# Patient Record
Sex: Female | Born: 1960 | Race: Black or African American | Hispanic: No | Marital: Single | Smoking: Former smoker
Health system: Southern US, Community
[De-identification: ages and names within clinical notes are randomized; demographics above are authoritative.]

## PROBLEM LIST (undated history)

## (undated) DIAGNOSIS — K219 Gastro-esophageal reflux disease without esophagitis: Secondary | ICD-10-CM

## (undated) DIAGNOSIS — C801 Malignant (primary) neoplasm, unspecified: Secondary | ICD-10-CM

## (undated) DIAGNOSIS — L732 Hidradenitis suppurativa: Secondary | ICD-10-CM

## (undated) DIAGNOSIS — E785 Hyperlipidemia, unspecified: Secondary | ICD-10-CM

## (undated) DIAGNOSIS — E119 Type 2 diabetes mellitus without complications: Secondary | ICD-10-CM

## (undated) DIAGNOSIS — M1990 Unspecified osteoarthritis, unspecified site: Secondary | ICD-10-CM

## (undated) DIAGNOSIS — S83249A Other tear of medial meniscus, current injury, unspecified knee, initial encounter: Secondary | ICD-10-CM

## (undated) HISTORY — DX: Type 2 diabetes mellitus without complications: E11.9

## (undated) HISTORY — PX: AXILLARY HIDRADENITIS EXCISION: SUR522

## (undated) HISTORY — PX: PERINEAL HIDRADENITIS EXCISION: SUR524

## (undated) HISTORY — DX: Hidradenitis suppurativa: L73.2

---
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
## 2000-02-16 ENCOUNTER — Other Ambulatory Visit: Admission: RE | Admit: 2000-02-16 | Discharge: 2000-02-16 | Payer: Self-pay | Admitting: Obstetrics and Gynecology

## 2000-08-02 ENCOUNTER — Encounter: Admission: RE | Admit: 2000-08-02 | Discharge: 2000-10-31 | Payer: Self-pay | Admitting: Internal Medicine

## 2001-05-13 ENCOUNTER — Other Ambulatory Visit: Admission: RE | Admit: 2001-05-13 | Discharge: 2001-05-13 | Payer: Self-pay | Admitting: Internal Medicine

## 2001-05-29 ENCOUNTER — Encounter: Admission: RE | Admit: 2001-05-29 | Discharge: 2001-08-27 | Payer: Self-pay | Admitting: Internal Medicine

## 2012-08-23 ENCOUNTER — Ambulatory Visit: Payer: Federal, State, Local not specified - PPO

## 2012-08-23 ENCOUNTER — Ambulatory Visit (INDEPENDENT_AMBULATORY_CARE_PROVIDER_SITE_OTHER): Payer: BC Managed Care – PPO | Admitting: Family Medicine

## 2012-08-23 VITALS — BP 130/81 | HR 88 | Temp 98.7°F | Resp 18 | Ht 71.5 in | Wt 247.0 lb

## 2012-08-23 DIAGNOSIS — M79609 Pain in unspecified limb: Secondary | ICD-10-CM

## 2012-08-23 DIAGNOSIS — M79669 Pain in unspecified lower leg: Secondary | ICD-10-CM

## 2012-08-23 NOTE — Progress Notes (Signed)
[facility] ([facility]) [address], [place] [address] [phone]  Date:  08/23/2012   Name:  Kathryn Webb   DOB:  February 06, 1961   MRN:  [id number]  PCP:  No primary provider on file.    Chief Complaint: Leg Pain   History of Present Illness:  Kathryn Webb is a 51 y.o. very pleasant female patient who presents with the following:  She was exercising yesterday- doing an aerobics class at her job- and had sudden onset of pain in left lower leg.  She felt a "pull."  She notes tenderness and some swelling in the posterior calf- she tried ice and tiger balm.  She is able to walk but it hurts. No pain when she is still/ off of the leg.    Otherwise she is generally healthy except she is overweight.  She is working on this by exercise so she is disappointed that she got hurt.      There is no problem list on file for this patient.   No past medical history on file.  No past surgical history on file.  History  Substance Use Topics  . Smoking status: Former Games developer  . Smokeless tobacco: Not on file  . Alcohol Use: Not on file    No family history on file.  No Known Allergies  Medication list has been reviewed and updated.  Current Outpatient Prescriptions on File Prior to Visit  Medication Sig Dispense Refill  . phentermine 37.5 MG capsule Take 37.5 mg by mouth every morning.        Review of Systems:  As per HPI- otherwise negative.   Physical Examination: Filed Vitals:   08/23/12 1050  BP: 130/81  Pulse: 88  Temp: 98.7 F (37.1 C)  Resp: 18   Filed Vitals:   08/23/12 1050  Height: 5' 11.5" (1.816 m)  Weight: 247 lb (112.038 kg)   Body mass index is 33.97 kg/(m^2). Ideal Body Weight: Weight in (lb) to have BMI = 25: 181.4   GEN: WDWN, NAD, Non-toxic, A & O x 3- tall build HEENT: Atraumatic, Normocephalic. Neck supple. No masses, No LAD. Ears and Nose: No external deformity. CV: RRR, No M/G/R. No JVD. No thrill. No extra heart sounds. PULM:  CTA B, no wheezes, crackles, rhonchi. No retractions. No resp. distress. No accessory muscle use. EXTR: No c/c/e NEURO Normal gait.  PSYCH: Normally interactive. Conversant. Not depressed or anxious appearing.  Calm demeanor.  Left calf: she has tenderness in the proximal and mid- calf muscle.  Minimal swelling,  No cords or redness.  The achilles is intact and non- tender.  She has good flexion and extension of her knee.  She is able to flex and extend the foot but it hurts.  She has normal perfusion, sensation and motion of her toes, normal DP pulse  [facility] reading (PRIMARY) by  Dr. Patsy Lager.  Left tib/ fib: negative    Assessment and Plan: 1. Calf tenderness  DG Tibia/Fibula Left   Calf muscle tear.  Conservative therapy.  She did not want crutches- tried them and was not comfortable.  See pt instructions for more.    Abbe Amsterdam, MD

## 2012-08-23 NOTE — Patient Instructions (Addendum)
Try ibuprofen up to 800 mg three times a day for the next several days.  You can also use ice, elevation, an ace bandage and rest.  Let pain be your guide as far as returning to your activities.  If you are not a good bit better by Monday please let me know.

## 2013-03-07 ENCOUNTER — Other Ambulatory Visit (INDEPENDENT_AMBULATORY_CARE_PROVIDER_SITE_OTHER): Payer: BC Managed Care – PPO

## 2013-03-07 ENCOUNTER — Encounter: Payer: Self-pay | Admitting: Internal Medicine

## 2013-03-07 ENCOUNTER — Ambulatory Visit (INDEPENDENT_AMBULATORY_CARE_PROVIDER_SITE_OTHER): Payer: BC Managed Care – PPO | Admitting: Internal Medicine

## 2013-03-07 VITALS — BP 138/78 | HR 61 | Temp 97.9°F | Ht 72.0 in | Wt 244.2 lb

## 2013-03-07 DIAGNOSIS — Z13220 Encounter for screening for lipoid disorders: Secondary | ICD-10-CM

## 2013-03-07 DIAGNOSIS — Z131 Encounter for screening for diabetes mellitus: Secondary | ICD-10-CM

## 2013-03-07 DIAGNOSIS — Z1329 Encounter for screening for other suspected endocrine disorder: Secondary | ICD-10-CM

## 2013-03-07 DIAGNOSIS — Z0000 Encounter for general adult medical examination without abnormal findings: Secondary | ICD-10-CM

## 2013-03-07 DIAGNOSIS — F17200 Nicotine dependence, unspecified, uncomplicated: Secondary | ICD-10-CM

## 2013-03-07 DIAGNOSIS — Z130 Encounter for screening for diseases of the blood and blood-forming organs and certain disorders involving the immune mechanism: Secondary | ICD-10-CM

## 2013-03-07 DIAGNOSIS — E669 Obesity, unspecified: Secondary | ICD-10-CM

## 2013-03-07 LAB — LIPID PANEL: HDL: 36.9 mg/dL — ABNORMAL LOW (ref >39.00)

## 2013-03-07 LAB — LDL CHOLESTEROL, DIRECT: Direct LDL: 199.2 mg/dL

## 2013-03-07 LAB — CBC
HCT: 41.3 % (ref 36.0–46.0)
MCV: 85.1 fl (ref 78.0–100.0)
RBC: 4.86 Mil/uL (ref 3.87–5.11)

## 2013-03-07 LAB — BASIC METABOLIC PANEL
Calcium: 11.5 mg/dL — ABNORMAL HIGH (ref 8.4–10.5)
GFR: 109.99 mL/min (ref >60.00)
Glucose, Bld: 99 mg/dL (ref 70–99)
Sodium: 142 mEq/L (ref 135–145)

## 2013-03-07 LAB — HEMOGLOBIN A1C: Hgb A1c MFr Bld: 6.5 % (ref 4.6–6.5)

## 2013-03-07 MED ORDER — PHENTERMINE HCL 37.5 MG PO CAPS: 37.5000 mg | ORAL_CAPSULE | ORAL | Status: DC

## 2013-03-07 MED ORDER — VARENICLINE TARTRATE 0.5 MG X 11 & 1 MG X 42 PO MISC: ORAL | Status: DC

## 2013-03-07 NOTE — Patient Instructions (Signed)

## 2013-03-07 NOTE — Progress Notes (Signed)
HPI  Pt presents to the clinic today to establish care. She is transferring care from [facility]. She does have a few concerns today. She has gained some weight. She does report that her diet in not what it should be. She eats healthy for breakfast and dinner but not so much for lunch. She does do aerobic exercise 3-4 times per week. She did take phentermine in the past and had great success with it. She would like to try it again.  She also would like to quit smoking. She has quit successfully in the past with Chantix. It worked well for her. She would like to try that again.  Flu: never Tetanus: 2012 LMP: 11/2012 (menoausal) Pap smear: 2013 Mammogram: 2013 Eye doctor: as needed Dentist: yearly  Past Medical History  Diagnosis Date  . Diabetes mellitus without complication   . Hidradenitis suppurativa     No current outpatient prescriptions on file.   No current facility-administered medications for this visit.    No Known Allergies  Family History  Problem Relation Age of Onset  . Heart disease Mother   . Hypertension Mother   . Diabetes Mother   . Hypertension Father   . Kidney disease Father   . Diabetes Father   . Diabetes Brother     History   Social History  . Marital Status: Single    Spouse Name: N/A    Number of Children: N/A  . Years of Education: 12   Occupational History  . Environmental health practitioner    Social History Main Topics  . Smoking status: Current Every Day Smoker  . Smokeless tobacco: Never Used  . Alcohol Use: Yes  . Drug Use: No  . Sexually Active: Not on file   Other Topics Concern  . Not on file   Social History Narrative   Regular exercise-yes   Caffiene Use-yes    ROS:  Constitutional: Pt reports weight gain. Denies fever, malaise, fatigue, headache.  HEENT: Denies eye pain, eye redness, ear pain, ringing in the ears, wax buildup, runny nose, nasal congestion, bloody nose, or sore throat. Respiratory: Denies  difficulty breathing, shortness of breath, cough or sputum production.   Cardiovascular: Denies chest pain, chest tightness, palpitations or swelling in the hands or feet.  Gastrointestinal: Denies abdominal pain, bloating, constipation, diarrhea or blood in the stool.  GU: Denies frequency, urgency, pain with urination, blood in urine, odor or discharge. Musculoskeletal: Denies decrease in range of motion, difficulty with gait, muscle pain or joint pain and swelling.  Skin: Denies redness, rashes, lesions or ulcercations.  Neurological: Denies dizziness, difficulty with memory, difficulty with speech or problems with balance and coordination.   No other specific complaints in a complete review of systems (except as listed in HPI above).  PE:  BP 138/78  Pulse 61  Temp(Src) 97.9 F (36.6 C) (Oral)  Ht 6' (1.829 m)  Wt 244 lb 4 oz (110.791 kg)  BMI 33.12 kg/m2  SpO2 95%  LMP 11/20/2012 Wt Readings from Last 3 Encounters:  03/07/13 244 lb 4 oz (110.791 kg)  08/23/12 247 lb (112.038 kg)    General: Appears her stated age, obese but well developed, well nourished in NAD. HEENT: Head: normal shape and size; Eyes: sclera white, no icterus, conjunctiva pink, PERRLA and EOMs intact; Ears: Tm's gray and intact, normal light reflex; Nose: mucosa pink and moist, septum midline; Throat/Mouth: Teeth present, mucosa pink and moist, no lesions or ulcerations noted.  Neck: Normal range of motion.  Neck supple, trachea midline. No massses, lumps or thyromegaly present.  Cardiovascular: Normal rate and rhythm. S1,S2 noted.  No murmur, rubs or gallops noted. No JVD or BLE edema. No carotid bruits noted. Pulmonary/Chest: Normal effort and positive vesicular breath sounds. No respiratory distress. No wheezes, rales or ronchi noted.  Abdomen: Soft and nontender. Normal bowel sounds, no bruits noted. No distention or masses noted. Liver, spleen and kidneys non palpable. Musculoskeletal: Normal range of  motion. No signs of joint swelling. No difficulty with gait.  Neurological: Alert and oriented. Cranial nerves II-XII intact. Coordination normal. +DTRs bilaterally. Psychiatric: Mood and affect normal. Behavior is normal. Judgment and thought content normal.     Assessment and Plan:  Preventative Health maintenance:  Encouraged pt to continue diet and exercise Smoking cessation counseling given approx 5 minutes- materials provided Will obtain basic screening labs  Obesity:  Will start phentermine Continue diet and exercise RTC in 1 month for follow up  Smoking:  eRx for chantix

## 2013-03-10 ENCOUNTER — Encounter: Payer: Self-pay | Admitting: *Deleted

## 2013-03-14 ENCOUNTER — Ambulatory Visit (INDEPENDENT_AMBULATORY_CARE_PROVIDER_SITE_OTHER): Payer: BC Managed Care – PPO | Admitting: Internal Medicine

## 2013-03-14 ENCOUNTER — Encounter: Payer: Self-pay | Admitting: Internal Medicine

## 2013-03-14 VITALS — BP 130/70 | HR 82 | Temp 97.9°F | Wt 245.8 lb

## 2013-03-14 DIAGNOSIS — E1169 Type 2 diabetes mellitus with other specified complication: Secondary | ICD-10-CM | POA: Insufficient documentation

## 2013-03-14 DIAGNOSIS — E785 Hyperlipidemia, unspecified: Secondary | ICD-10-CM | POA: Insufficient documentation

## 2013-03-14 DIAGNOSIS — E119 Type 2 diabetes mellitus without complications: Secondary | ICD-10-CM | POA: Insufficient documentation

## 2013-03-14 DIAGNOSIS — E669 Obesity, unspecified: Secondary | ICD-10-CM

## 2013-03-14 NOTE — Assessment & Plan Note (Signed)
Pt counseled on the importance of diet and exercise Will get OTC fish oil supplement Education for low fat low cholesterol diet  Will repeat lipid panel in 3 months, if still elevated, will start statin therapy

## 2013-03-14 NOTE — Assessment & Plan Note (Signed)
Pt counseled on importance of lifestyle modifications Encourage pt to continue diet and exercise  Will recheck A1C in 3 months

## 2013-03-14 NOTE — Patient Instructions (Signed)
Diabetes and Standards of Medical Care  Diabetes is complicated. You may find that your diabetes team includes a dietitian, nurse, diabetes educator, eye doctor, and more. To help everyone know what is going on and to help you get the care you deserve, the following schedule of care was developed to help keep you on track. Below are the tests, exams, vaccines, medicines, education, and plans you will need. A1c test  Performed at least 2 times a year if you are meeting treatment goals.  Performed 4 times a year if therapy has changed or if you are not meeting therapy/glycemic goals. Aspirin medicine  Take daily as directed by your caregiver. Blood pressure test  Performed at every routine medical visit. The goal is less than 130/80 mm/Hg. Dental exam  Get a dental exam at least 2 times a year. Dilated eye exam (retinal exam)  Type 1 diabetes: Get an exam within 5 years of diagnosis and then yearly.  Type 2 diabetes: Get an exam at diagnosis and then yearly. All exams thereafter can be extended to every 2 to 3 years if one or more exams have been normal. Foot care exam  Visual foot exams are performed at every routine medical visit. The exams check for cuts, injuries, or other problems with the feet.  A comprehensive foot exam should be done yearly. This includes visual inspection as well as assessing foot pulses and testing for loss of sensation. Kidney function test (urine microalbumin)  Performed once a year.  Type 1 diabetes: The first test is performed 5 years after diagnosis.  Type 2 diabetes: The first test is performed at the time of diagnosis.  A serum creatinine and estimated glomerular filtration rate (eGFR) test is done once a year to tell the level of chronic kidney disease (CKD), if present. Lipid profile (Cholesterol, HDL, LDL, Triglycerides)  Performed once a year for most people. If at low risk, may be assessed every 2 years.  The goal for LDL is less than 100  mg/dl. If at high risk, the goal is less than 70 mg/dl.  The goal for HDL is higher than 40 mg/dl for men and higher than 50 mg/dl for women.  The goal for triglycerides is less than 150 mg/dl. Flu vaccine, pneumonia vaccine, and hepatitis B vaccine  The flu vaccine is recommended yearly.  The pneumonia vaccine is generally given once in a lifetime. However, there are some instances where another vaccine is recommended. Check with your caregiver.  The hepatitis B vaccine is also recommended for adults with diabetes. Diabetes self-management education  Recommended at diagnosis and ongoing as needed. Treatment plan  Reviewed at every medical visit. Document Released: 09/03/2009 Document Revised: 01/29/2012 Document Reviewed: 05/09/2011 [facility] Patient Information 2013 [place], [place]. Fat and Cholesterol Control Diet Cholesterol levels in your body are determined significantly by your diet. Cholesterol levels may also be related to heart disease. The following material helps to explain this relationship and discusses what you can do to help keep your heart healthy. Not all cholesterol is bad. Low-density lipoprotein (LDL) cholesterol is the "bad" cholesterol. It may cause fatty deposits to build up inside your arteries. High-density lipoprotein (HDL) cholesterol is "good." It helps to remove the "bad" LDL cholesterol from your blood. Cholesterol is a very important risk factor for heart disease. Other risk factors are high blood pressure, smoking, stress, heredity, and weight. The heart muscle gets its supply of blood through the coronary arteries. If your LDL cholesterol is high and your  HDL cholesterol is low, you are at risk for having fatty deposits build up in your coronary arteries. This leaves less room through which blood can flow. Without sufficient blood and oxygen, the heart muscle cannot function properly and you may feel chest pains (angina pectoris). When a coronary artery closes  up entirely, a part of the heart muscle may die causing a heart attack (myocardial infarction). CHECKING CHOLESTEROL When your caregiver sends your blood to a lab to be examined for cholesterol, a complete lipid (fat) profile may be done. With this test, the total amount of cholesterol and levels of LDL and HDL are determined. Triglycerides are a type of fat that circulates in the blood. They can also be used to determine heart disease risk. The list below describes what the numbers should be: Test: Total Cholesterol.  Less than 200 mg/dl. Test: LDL "bad cholesterol."  Less than 100 mg/dl.  Less than 70 mg/dl if you are at very high risk of a heart attack or sudden cardiac death. Test: HDL "good cholesterol."  Greater than 50 mg/dl for women.  Greater than 40 mg/dl for men. Test: Triglycerides.  Less than 150 mg/dl. CONTROLLING CHOLESTEROL WITH DIET Although exercise and lifestyle factors are important, your diet is key. That is because certain foods are known to raise cholesterol and others to lower it. The goal is to balance foods for their effect on cholesterol and more importantly, to replace saturated and trans fat with other types of fat, such as monounsaturated fat, polyunsaturated fat, and omega-3 fatty acids. On average, a person should consume no more than 15 to 17 g of saturated fat daily. Saturated and trans fats are considered "bad" fats, and they will raise LDL cholesterol. Saturated fats are primarily found in animal products such as meats, butter, and cream. However, that does not mean you need to give up all your favorite foods. Today, there are good tasting, low-fat, low-cholesterol substitutes for most of the things you like to eat. Choose low-fat or nonfat alternatives. Choose round or loin cuts of red meat. These types of cuts are lowest in fat and cholesterol. Chicken (without the skin), fish, veal, and ground [place] breast are great choices. Eliminate fatty meats, such as  hot dogs and salami. Even shellfish have little or no saturated fat. Have a 3 oz (85 g) portion when you eat lean meat, poultry, or fish. Trans fats are also called "partially hydrogenated oils." They are oils that have been scientifically manipulated so that they are solid at room temperature resulting in a longer shelf life and improved taste and texture of foods in which they are added. Trans fats are found in stick margarine, some tub margarines, cookies, crackers, and baked goods.  When baking and cooking, oils are a great substitute for butter. The monounsaturated oils are especially beneficial since it is believed they lower LDL and raise HDL. The oils you should avoid entirely are saturated tropical oils, such as coconut and palm.  Remember to eat a lot from food groups that are naturally free of saturated and trans fat, including fish, fruit, vegetables, beans, grains (barley, rice, couscous, bulgur wheat), and pasta (without cream sauces).  IDENTIFYING FOODS THAT LOWER CHOLESTEROL  Soluble fiber may lower your cholesterol. This type of fiber is found in fruits such as apples, vegetables such as broccoli, potatoes, and carrots, legumes such as beans, peas, and lentils, and grains such as barley. Foods fortified with plant sterols (phytosterol) may also lower cholesterol. You should eat  at least 2 g per day of these foods for a cholesterol lowering effect.  Read package labels to identify low-saturated fats, trans fat free, and low-fat foods at the supermarket. Select cheeses that have only 2 to 3 g saturated fat per ounce. Use a heart-healthy tub margarine that is free of trans fats or partially hydrogenated oil. When buying baked goods (cookies, crackers), avoid partially hydrogenated oils. Breads and muffins should be made from whole grains (whole-wheat or whole oat flour, instead of "flour" or "enriched flour"). Buy non-creamy canned soups with reduced salt and no added fats.  FOOD PREPARATION  TECHNIQUES  Never deep-fry. If you must fry, either stir-fry, which uses very little fat, or use non-stick cooking sprays. When possible, broil, bake, or roast meats, and steam vegetables. Instead of putting butter or margarine on vegetables, use lemon and herbs, applesauce, and cinnamon (for squash and sweet potatoes), nonfat yogurt, salsa, and low-fat dressings for salads.  LOW-SATURATED FAT / LOW-FAT FOOD SUBSTITUTES Meats / Saturated Fat (g)  Avoid: Steak, marbled (3 oz/85 g) / 11 g  Choose: Steak, lean (3 oz/85 g) / 4 g  Avoid: Hamburger (3 oz/85 g) / 7 g  Choose: Hamburger, lean (3 oz/85 g) / 5 g  Avoid: Ham (3 oz/85 g) / 6 g  Choose: Ham, lean cut (3 oz/85 g) / 2.4 g  Avoid: Chicken, with skin, dark meat (3 oz/85 g) / 4 g  Choose: Chicken, skin removed, dark meat (3 oz/85 g) / 2 g  Avoid: Chicken, with skin, light meat (3 oz/85 g) / 2.5 g  Choose: Chicken, skin removed, light meat (3 oz/85 g) / 1 g Dairy / Saturated Fat (g)  Avoid: Whole milk (1 cup) / 5 g  Choose: Low-fat milk, 2% (1 cup) / 3 g  Choose: Low-fat milk, 1% (1 cup) / 1.5 g  Choose: Skim milk (1 cup) / 0.3 g  Avoid: Hard cheese (1 oz/28 g) / 6 g  Choose: Skim milk cheese (1 oz/28 g) / 2 to 3 g  Avoid: Cottage cheese, 4% fat (1 cup) / 6.5 g  Choose: Low-fat cottage cheese, 1% fat (1 cup) / 1.5 g  Avoid: Ice cream (1 cup) / 9 g  Choose: Sherbet (1 cup) / 2.5 g  Choose: Nonfat frozen yogurt (1 cup) / 0.3 g  Choose: Frozen fruit bar / trace  Avoid: Whipped cream (1 tbs) / 3.5 g  Choose: Nondairy whipped topping (1 tbs) / 1 g Condiments / Saturated Fat (g)  Avoid: Mayonnaise (1 tbs) / 2 g  Choose: Low-fat mayonnaise (1 tbs) / 1 g  Avoid: Butter (1 tbs) / 7 g  Choose: Extra light margarine (1 tbs) / 1 g  Avoid: Coconut oil (1 tbs) / 11.8 g  Choose: Olive oil (1 tbs) / 1.8 g  Choose: Corn oil (1 tbs) / 1.7 g  Choose: Safflower oil (1 tbs) / 1.2 g  Choose: Sunflower oil (1 tbs) / 1.4  g  Choose: Soybean oil (1 tbs) / 2.4 g  Choose: Canola oil (1 tbs) / 1 g Document Released: 11/06/2005 Document Revised: 01/29/2012 Document Reviewed: 04/27/2011 [facility] Patient Information 2013 [place], [place].

## 2013-03-14 NOTE — Progress Notes (Signed)
Subjective:    Patient ID: Kathryn Webb, female    DOB: [birth year], 52 y.o.   MRN: [id number]  HPI  Pt presents to the clinic today to f/u her labs. At her last visit, labs were drawn. Her cholesterol, triglycerides and LDL were very elevated. She does report noncompliance with maintaining a balanced diet. She does eat a lot of fried foods and fatty foods. Her A1C was alos elevated a 6.5%. She does have a family history of diabetes and hyperlipidemia.  Review of Systems      Past Medical History  Diagnosis Date  . Diabetes mellitus without complication   . Hidradenitis suppurativa     Current Outpatient Prescriptions  Medication Sig Dispense Refill  . phentermine 37.5 MG capsule Take 1 capsule (37.5 mg total) by mouth every morning.  30 capsule  0  . varenicline (CHANTIX STARTING MONTH PAK) 0.5 MG X 11 & 1 MG X 42 tablet Take one 0.5 mg tablet by mouth once daily for 3 days, then increase to one 0.5 mg tablet twice daily for 4 days, then increase to one 1 mg tablet twice daily.  53 tablet  0   No current facility-administered medications for this visit.    No Known Allergies  Family History  Problem Relation Age of Onset  . Heart disease Mother   . Hypertension Mother   . Diabetes Mother   . Hypertension Father   . Kidney disease Father   . Diabetes Father   . Diabetes Brother   . Cancer Paternal Uncle   . Stroke Neg Hx     History   Social History  . Marital Status: Single    Spouse Name: N/A    Number of Children: N/A  . Years of Education: 12   Occupational History  . Environmental health practitioner    Social History Main Topics  . Smoking status: Current Every Day Smoker  . Smokeless tobacco: Never Used  . Alcohol Use: Yes  . Drug Use: No  . Sexually Active: Not on file   Other Topics Concern  . Not on file   Social History Narrative   Regular exercise-yes   Caffiene Use-yes     Constitutional: Denies fever, malaise, fatigue, headache or abrupt weight  changes.  Respiratory: Denies difficulty breathing, shortness of breath, cough or sputum production.   Cardiovascular: Denies chest pain, chest tightness, palpitations or swelling in the hands or feet.  Neurological: Denies dizziness, difficulty with memory, difficulty with speech or problems with balance and coordination.   No other specific complaints in a complete review of systems (except as listed in HPI above).  Objective:   Physical Exam  BP 130/70  Pulse 82  Temp(Src) 97.9 F (36.6 C) (Oral)  Wt 245 lb 12.8 oz (111.494 kg)  BMI 33.33 kg/m2  SpO2 96%  LMP 11/20/2012 Wt Readings from Last 3 Encounters:  03/14/13 245 lb 12.8 oz (111.494 kg)  03/07/13 244 lb 4 oz (110.791 kg)  08/23/12 247 lb (112.038 kg)    General: Appears her stated age, well developed, well nourished in NAD.Marland Kitchen  Cardiovascular: Normal rate and rhythm. S1,S2 noted.  No murmur, rubs or gallops noted. No JVD or BLE edema. No carotid bruits noted. Pulmonary/Chest: Normal effort and positive vesicular breath sounds. No respiratory distress. No wheezes, rales or ronchi noted.   Neurological: Alert and oriented. Cranial nerves II-XII intact. Coordination normal. +DTRs bilaterally.     BMET    Component Value Date/Time   NA 142  03/07/2013 0939   K 5.2* 03/07/2013 0939   CL 107 03/07/2013 0939   CO2 27 03/07/2013 0939   GLUCOSE 99 03/07/2013 0939   BUN 10 03/07/2013 0939   CREATININE 0.7 03/07/2013 0939   CALCIUM 11.5* 03/07/2013 0939    Lipid Panel     Component Value Date/Time   CHOL 270* 03/07/2013 0939   TRIG 173.0* 03/07/2013 0939   HDL 36.90* 03/07/2013 0939   CHOLHDL 7 03/07/2013 0939   VLDL 34.6 03/07/2013 0939    CBC    Component Value Date/Time   WBC 6.1 03/07/2013 0939   RBC 4.86 03/07/2013 0939   HGB 13.9 03/07/2013 0939   HCT 41.3 03/07/2013 0939   PLT 187.0 03/07/2013 0939   MCV 85.1 03/07/2013 0939   MCHC 33.7 03/07/2013 0939   RDW 15.2* 03/07/2013 0939    Hgb A1C Lab Results  Component  Value Date   HGBA1C 6.5 03/07/2013         Assessment & Plan:

## 2013-04-07 ENCOUNTER — Other Ambulatory Visit: Payer: BC Managed Care – PPO

## 2013-04-07 ENCOUNTER — Ambulatory Visit: Payer: BC Managed Care – PPO | Admitting: Internal Medicine

## 2013-04-07 DIAGNOSIS — Z0289 Encounter for other administrative examinations: Secondary | ICD-10-CM

## 2013-08-28 ENCOUNTER — Other Ambulatory Visit (HOSPITAL_COMMUNITY)
Admission: RE | Admit: 2013-08-28 | Discharge: 2013-08-28 | Disposition: A | Discharge status: Home / Self Care | Payer: BC Managed Care – PPO | Source: Ambulatory Visit | Attending: Internal Medicine | Admitting: Internal Medicine

## 2013-08-28 ENCOUNTER — Ambulatory Visit (INDEPENDENT_AMBULATORY_CARE_PROVIDER_SITE_OTHER): Payer: BC Managed Care – PPO | Admitting: Internal Medicine

## 2013-08-28 ENCOUNTER — Encounter: Payer: Self-pay | Admitting: Internal Medicine

## 2013-08-28 ENCOUNTER — Ambulatory Visit (INDEPENDENT_AMBULATORY_CARE_PROVIDER_SITE_OTHER): Payer: BC Managed Care – PPO

## 2013-08-28 VITALS — BP 130/78 | HR 73 | Temp 98.1°F | Ht 71.5 in | Wt 246.5 lb

## 2013-08-28 DIAGNOSIS — E119 Type 2 diabetes mellitus without complications: Secondary | ICD-10-CM

## 2013-08-28 DIAGNOSIS — E785 Hyperlipidemia, unspecified: Secondary | ICD-10-CM

## 2013-08-28 DIAGNOSIS — Z01419 Encounter for gynecological examination (general) (routine) without abnormal findings: Secondary | ICD-10-CM

## 2013-08-28 DIAGNOSIS — Z124 Encounter for screening for malignant neoplasm of cervix: Secondary | ICD-10-CM

## 2013-08-28 DIAGNOSIS — L0291 Cutaneous abscess, unspecified: Secondary | ICD-10-CM

## 2013-08-28 DIAGNOSIS — B379 Candidiasis, unspecified: Secondary | ICD-10-CM

## 2013-08-28 DIAGNOSIS — L0390 Cellulitis, unspecified: Secondary | ICD-10-CM

## 2013-08-28 LAB — HEMOGLOBIN A1C: Hgb A1c MFr Bld: 6.7 % — ABNORMAL HIGH (ref 4.6–6.5)

## 2013-08-28 LAB — LIPID PANEL
Cholesterol: 235 mg/dL — ABNORMAL HIGH (ref 0–200)
Triglycerides: 330 mg/dL — ABNORMAL HIGH (ref 0.0–149.0)
VLDL: 66 mg/dL — ABNORMAL HIGH (ref 0.0–40.0)

## 2013-08-28 MED ORDER — FLUCONAZOLE 150 MG PO TABS: 150.0000 mg | ORAL_TABLET | Freq: Once | ORAL | Status: DC

## 2013-08-28 MED ORDER — CEPHALEXIN 500 MG PO CAPS: 500.0000 mg | ORAL_CAPSULE | Freq: Four times a day (QID) | ORAL | Status: DC

## 2013-08-28 NOTE — Addendum Note (Signed)
Addended by: Lorre Munroe on: 08/28/2013 02:53 PM   Modules accepted: Orders

## 2013-08-28 NOTE — Addendum Note (Signed)
Addended by: Darnell Level on: 08/28/2013 03:13 PM   Modules accepted: Orders

## 2013-08-28 NOTE — Assessment & Plan Note (Signed)
Will recheck A1C today If elevated will start metformin

## 2013-08-28 NOTE — Assessment & Plan Note (Signed)
Will recheck lipid panel today 

## 2013-08-28 NOTE — Addendum Note (Signed)
Addended by: Darnell Level on: 08/28/2013 01:59 PM   Modules accepted: Orders

## 2013-08-28 NOTE — Progress Notes (Signed)
Subjective:    Patient ID: Kathryn Webb, female    DOB: 12-Aug-1961, 52 y.o.   MRN: [id number]  HPI  Pt presents to the clinic today for her pap smear. She is postmenopausal. Last pap was in 2013. She has had her mammogram done this year. She denies vaginal complaints.  She does have some concerns about pain, redness and swelling of one of her hydronitis lesions. She thinks it may be infected. It has not drained but it is very tender, red and swollen. She has tried neosporin without much relief.  Review of Systems      Past Medical History  Diagnosis Date  . Diabetes mellitus without complication   . Hidradenitis suppurativa     Current Outpatient Prescriptions  Medication Sig Dispense Refill  . cephALEXin (KEFLEX) 500 MG capsule Take 1 capsule (500 mg total) by mouth 4 (four) times daily.  28 capsule  0  . fluconazole (DIFLUCAN) 150 MG tablet Take 1 tablet (150 mg total) by mouth once.  1 tablet  0   No current facility-administered medications for this visit.    No Known Allergies  Family History  Problem Relation Age of Onset  . Heart disease Mother   . Hypertension Mother   . Diabetes Mother   . Hypertension Father   . Kidney disease Father   . Diabetes Father   . Diabetes Brother   . Cancer Paternal Uncle   . Stroke Neg Hx     History   Social History  . Marital Status: Single    Spouse Name: N/A    Number of Children: N/A  . Years of Education: 12   Occupational History  . Environmental health practitioner    Social History Main Topics  . Smoking status: Current Every Day Smoker  . Smokeless tobacco: Never Used  . Alcohol Use: Yes  . Drug Use: No  . Sexual Activity: Not on file   Other Topics Concern  . Not on file   Social History Narrative   Regular exercise-yes   Caffiene Use-yes     Constitutional: Denies fever, malaise, fatigue, headache or abrupt weight changes.  GU: Denies urgency, frequency, pain with urination, burning sensation, blood in  urine, odor or discharge. Skin: Pt reports redness, warmth, tenderness of left leg. Denies redness, rashes, lesions or ulcercations.   No other specific complaints in a complete review of systems (except as listed in HPI above).  Objective:   Physical Exam  Constitutional:  Alert, oriented x 4, well developed, well nourished in no apparent distress. Skin: Skin is warm and dry. Multiple hydronitis lesions noted on left anterior shin. Cellulitis surrounding 1 lesion. Cardiovascular: Normal rate and rhythm. S1,S2 noted.  No murmur, rubs or gallops noted. No JVD or BLE edema. No carotid bruits noted. Pulmonary/Chest: Normal effort and positive vesicular breath sounds. No respiratory distress. No wheezes, rales or ronchi noted.  Abdomen: Soft and nontender. Normal bowel sounds, no bruits noted. No distention or masses noted. Liver, spleen and kidneys non palpable. Genitourinary: Normal female anatomy. Uterus midline, anterior and soft. No CMT or discharge noted. Adenexa non palpable. Breast without lumps or masses.         Assessment & Plan:   Screening for cervical cancer with routine gyn exam:  Pap smear obtained- will call you with the results Breast exam and bimanual performed  Cellulitis of left anterior leg:  eRx for Keflex 500 mg QID x 7 days  RTC as needed or if symptoms  persist or worsen

## 2013-08-28 NOTE — Patient Instructions (Signed)

## 2013-08-29 ENCOUNTER — Other Ambulatory Visit: Payer: Self-pay | Admitting: Internal Medicine

## 2013-08-29 ENCOUNTER — Telehealth: Payer: Self-pay | Admitting: *Deleted

## 2013-08-29 LAB — LDL CHOLESTEROL, DIRECT: Direct LDL: 158.8 mg/dL

## 2013-08-29 MED ORDER — METFORMIN HCL 500 MG PO TABS: 500.0000 mg | ORAL_TABLET | Freq: Two times a day (BID) | ORAL | Status: DC

## 2013-08-29 MED ORDER — TRAMADOL HCL 50 MG PO TABS: 50.0000 mg | ORAL_TABLET | Freq: Three times a day (TID) | ORAL | Status: DC | PRN

## 2013-08-29 NOTE — Telephone Encounter (Signed)
Pt called states she understood she was to have a pain medication Rx yesterday as well.  Please advise

## 2013-08-29 NOTE — Telephone Encounter (Signed)
Spoke with pt advised rx called in to pharmacy

## 2013-08-29 NOTE — Telephone Encounter (Signed)
Tramadol 50 mg q8h prn for pain #30 no refills

## 2013-09-25 ENCOUNTER — Other Ambulatory Visit: Payer: Self-pay

## 2014-03-11 ENCOUNTER — Other Ambulatory Visit (INDEPENDENT_AMBULATORY_CARE_PROVIDER_SITE_OTHER): Payer: BC Managed Care – PPO

## 2014-03-11 ENCOUNTER — Encounter: Payer: Self-pay | Admitting: Internal Medicine

## 2014-03-11 ENCOUNTER — Ambulatory Visit (INDEPENDENT_AMBULATORY_CARE_PROVIDER_SITE_OTHER): Payer: BC Managed Care – PPO | Admitting: Internal Medicine

## 2014-03-11 VITALS — BP 136/80 | HR 82 | Temp 98.2°F | Resp 16 | Ht 71.5 in | Wt 239.0 lb

## 2014-03-11 DIAGNOSIS — R300 Dysuria: Secondary | ICD-10-CM

## 2014-03-11 DIAGNOSIS — E119 Type 2 diabetes mellitus without complications: Secondary | ICD-10-CM

## 2014-03-11 DIAGNOSIS — E1169 Type 2 diabetes mellitus with other specified complication: Secondary | ICD-10-CM

## 2014-03-11 DIAGNOSIS — E669 Obesity, unspecified: Secondary | ICD-10-CM

## 2014-03-11 DIAGNOSIS — Z720 Tobacco use: Secondary | ICD-10-CM | POA: Insufficient documentation

## 2014-03-11 DIAGNOSIS — E785 Hyperlipidemia, unspecified: Secondary | ICD-10-CM

## 2014-03-11 DIAGNOSIS — Z0000 Encounter for general adult medical examination without abnormal findings: Secondary | ICD-10-CM

## 2014-03-11 DIAGNOSIS — Z23 Encounter for immunization: Secondary | ICD-10-CM | POA: Diagnosis not present

## 2014-03-11 LAB — CBC WITH DIFFERENTIAL/PLATELET
BASOS ABS: 0.1 10*3/uL (ref 0.0–0.1)
Basophils Relative: 1 % (ref 0.0–3.0)
EOS ABS: 0 10*3/uL (ref 0.0–0.7)
Eosinophils Relative: 0.7 % (ref 0.0–5.0)
HCT: 40.8 % (ref 36.0–46.0)
HEMOGLOBIN: 13.6 g/dL (ref 12.0–15.0)
LYMPHS ABS: 3.1 10*3/uL (ref 0.7–4.0)
LYMPHS PCT: 44 % (ref 12.0–46.0)
MCHC: 33.3 g/dL (ref 30.0–36.0)
MCV: 84.3 fl (ref 78.0–100.0)
MONOS PCT: 6.4 % (ref 3.0–12.0)
Monocytes Absolute: 0.4 10*3/uL (ref 0.1–1.0)
NEUTROS ABS: 3.3 10*3/uL (ref 1.4–7.7)
Neutrophils Relative %: 47.9 % (ref 43.0–77.0)
PLATELETS: 230 10*3/uL (ref 150.0–400.0)
RBC: 4.84 Mil/uL (ref 3.87–5.11)
RDW: 15.1 % — AB (ref 11.5–14.6)
WBC: 7 10*3/uL (ref 4.5–10.5)

## 2014-03-11 LAB — LIPID PANEL
CHOLESTEROL: 234 mg/dL — AB (ref 0–200)
HDL: 34.2 mg/dL — ABNORMAL LOW (ref >39.00)
LDL CALC: 167 mg/dL — AB (ref 0–99)
TRIGLYCERIDES: 165 mg/dL — AB (ref 0.0–149.0)
Total CHOL/HDL Ratio: 7
VLDL: 33 mg/dL (ref 0.0–40.0)

## 2014-03-11 LAB — COMPREHENSIVE METABOLIC PANEL
ALK PHOS: 112 U/L (ref 39–117)
ALT: 20 U/L (ref 0–35)
AST: 22 U/L (ref 0–37)
Albumin: 4.2 g/dL (ref 3.5–5.2)
BILIRUBIN TOTAL: 0.5 mg/dL (ref 0.3–1.2)
BUN: 12 mg/dL (ref 6–23)
CO2: 29 meq/L (ref 19–32)
CREATININE: 0.6 mg/dL (ref 0.4–1.2)
Calcium: 11.7 mg/dL — ABNORMAL HIGH (ref 8.4–10.5)
Chloride: 103 mEq/L (ref 96–112)
GFR: 127.8 mL/min (ref >60.00)
GLUCOSE: 110 mg/dL — AB (ref 70–99)
Potassium: 4.7 mEq/L (ref 3.5–5.1)
SODIUM: 139 meq/L (ref 135–145)
Total Protein: 8.6 g/dL — ABNORMAL HIGH (ref 6.0–8.3)

## 2014-03-11 LAB — URINALYSIS, ROUTINE W REFLEX MICROSCOPIC
BILIRUBIN URINE: NEGATIVE
Ketones, ur: NEGATIVE
LEUKOCYTES UA: NEGATIVE
NITRITE: NEGATIVE
Specific Gravity, Urine: 1.015 (ref 1.000–1.030)
TOTAL PROTEIN, URINE-UPE24: NEGATIVE
Urine Glucose: NEGATIVE
Urobilinogen, UA: 0.2 (ref 0.0–1.0)
pH: 6 (ref 5.0–8.0)

## 2014-03-11 LAB — MICROALBUMIN / CREATININE URINE RATIO
Creatinine,U: 90 mg/dL
MICROALB/CREAT RATIO: 1.2 mg/g (ref 0.0–30.0)
Microalb, Ur: 1.1 mg/dL (ref 0.0–1.9)

## 2014-03-11 LAB — TSH: TSH: 1.31 u[IU]/mL (ref 0.35–5.50)

## 2014-03-11 LAB — HEMOGLOBIN A1C: HEMOGLOBIN A1C: 6.6 % — AB (ref 4.6–6.5)

## 2014-03-11 MED ORDER — BAYER CONTOUR NEXT EZ W/DEVICE KIT: 1.0000 | PACK | Freq: Two times a day (BID) | Status: AC

## 2014-03-11 MED ORDER — GLUCOSE BLOOD VI STRP: ORAL_STRIP | Status: AC

## 2014-03-11 NOTE — Patient Instructions (Signed)
Type 2 Diabetes Mellitus, Adult Type 2 diabetes mellitus, often simply referred to as type 2 diabetes, is a long-lasting (chronic) disease. In type 2 diabetes, the pancreas does not make enough insulin (a hormone), the cells are less responsive to the insulin that is made (insulin resistance), or both. Normally, insulin moves sugars from food into the tissue cells. The tissue cells use the sugars for energy. The lack of insulin or the lack of normal response to insulin causes excess sugars to build up in the blood instead of going into the tissue cells. As a result, high blood sugar (hyperglycemia) develops. The effect of high sugar (glucose) levels can cause many complications. Type 2 diabetes was also previously called adult-onset diabetes but it can occur at any age.  RISK FACTORS  A person is predisposed to developing type 2 diabetes if someone in the family has the disease and also has one or more of the following primary risk factors:  Overweight.  An inactive lifestyle.  A history of consistently eating high-calorie foods. Maintaining a normal weight and regular physical activity can reduce the chance of developing type 2 diabetes. SYMPTOMS  A person with type 2 diabetes may not show symptoms initially. The symptoms of type 2 diabetes appear slowly. The symptoms include:  Increased thirst (polydipsia).  Increased urination (polyuria).  Increased urination during the night (nocturia).  Weight loss. This weight loss may be rapid.  Frequent, recurring infections.  Tiredness (fatigue).  Weakness.  Vision changes, such as blurred vision.  Fruity smell to your breath.  Abdominal pain.  Nausea or vomiting.  Cuts or bruises which are slow to heal.  Tingling or numbness in the hands or feet. DIAGNOSIS Type 2 diabetes is frequently not diagnosed until complications of diabetes are present. Type 2 diabetes is diagnosed when symptoms or complications are present and when blood  glucose levels are increased. Your blood glucose level may be checked by one or more of the following blood tests:  A fasting blood glucose test. You will not be allowed to eat for at least 8 hours before a blood sample is taken.  A random blood glucose test. Your blood glucose is checked at any time of the day regardless of when you ate.  A hemoglobin A1c blood glucose test. A hemoglobin A1c test provides information about blood glucose control over the previous 3 months.  An oral glucose tolerance test (OGTT). Your blood glucose is measured after you have not eaten (fasted) for 2 hours and then after you drink a glucose-containing beverage. TREATMENT   You may need to take insulin or diabetes medicine daily to keep blood glucose levels in the desired range.  You will need to match insulin dosing with exercise and healthy food choices. The treatment goal is to maintain the before meal blood sugar (preprandial glucose) level at 70 130 mg/dL. HOME CARE INSTRUCTIONS   Have your hemoglobin A1c level checked twice a year.  Perform daily blood glucose monitoring as directed by your caregiver.  Monitor urine ketones when you are ill and as directed by your caregiver.  Take your diabetes medicine or insulin as directed by your caregiver to maintain your blood glucose levels in the desired range.  Never run out of diabetes medicine or insulin. It is needed every day.  Adjust insulin based on your intake of carbohydrates. Carbohydrates can raise blood glucose levels but need to be included in your diet. Carbohydrates provide vitamins, minerals, and fiber which are an essential part of   a healthy diet. Carbohydrates are found in fruits, vegetables, whole grains, dairy products, legumes, and foods containing added sugars.    Eat healthy foods. Alternate 3 meals with 3 snacks.  Lose weight if overweight.  Carry a medical alert card or wear your medical alert jewelry.  Carry a 15 gram  carbohydrate snack with you at all times to treat low blood glucose (hypoglycemia). Some examples of 15 gram carbohydrate snacks include:  Glucose tablets, 3 or 4   Glucose gel, 15 gram tube  Raisins, 2 tablespoons (24 grams)  Jelly beans, 6  Animal crackers, 8  Regular pop, 4 ounces (120 mL)  Gummy treats, 9  Recognize hypoglycemia. Hypoglycemia occurs with blood glucose levels of 70 mg/dL and below. The risk for hypoglycemia increases when fasting or skipping meals, during or after intense exercise, and during sleep. Hypoglycemia symptoms can include:  Tremors or shakes.  Decreased ability to concentrate.  Sweating.  Increased heart rate.  Headache.  Dry mouth.  Hunger.  Irritability.  Anxiety.  Restless sleep.  Altered speech or coordination.  Confusion.  Treat hypoglycemia promptly. If you are alert and able to safely swallow, follow the 15:15 rule:  Take 15 20 grams of rapid-acting glucose or carbohydrate. Rapid-acting options include glucose gel, glucose tablets, or 4 ounces (120 mL) of fruit juice, regular soda, or low fat milk.  Check your blood glucose level 15 minutes after taking the glucose.  Take 15 20 grams more of glucose if the repeat blood glucose level is still 70 mg/dL or below.  Eat a meal or snack within 1 hour once blood glucose levels return to normal.    Be alert to polyuria and polydipsia which are early signs of hyperglycemia. An early awareness of hyperglycemia allows for prompt treatment. Treat hyperglycemia as directed by your caregiver.  Engage in at least 150 minutes of moderate-intensity physical activity a week, spread over at least 3 days of the week or as directed by your caregiver. In addition, you should engage in resistance exercise at least 2 times a week or as directed by your caregiver.  Adjust your medicine and food intake as needed if you start a new exercise or sport.  Follow your sick day plan at any time you  are unable to eat or drink as usual.  Avoid tobacco use.  Limit alcohol intake to no more than 1 drink per day for nonpregnant women and 2 drinks per day for men. You should drink alcohol only when you are also eating food. Talk with your caregiver whether alcohol is safe for you. Tell your caregiver if you drink alcohol several times a week.  Follow up with your caregiver regularly.  Schedule an eye exam soon after the diagnosis of type 2 diabetes and then annually.  Perform daily skin and foot care. Examine your skin and feet daily for cuts, bruises, redness, nail problems, bleeding, blisters, or sores. A foot exam by a caregiver should be done annually.  Brush your teeth and gums at least twice a day and floss at least once a day. Follow up with your dentist regularly.  Share your diabetes management plan with your workplace or school.  Stay up-to-date with immunizations.  Learn to manage stress.  Obtain ongoing diabetes education and support as needed.  Participate in, or seek rehabilitation as needed to maintain or improve independence and quality of life. Request a physical or occupational therapy referral if you are having foot or hand numbness or difficulties with grooming,   dressing, eating, or physical activity. SEEK MEDICAL CARE IF:   You are unable to eat food or drink fluids for more than 6 hours.  You have nausea and vomiting for more than 6 hours.  Your blood glucose level is over 240 mg/dL.  There is a change in mental status.  You develop an additional serious illness.  You have diarrhea for more than 6 hours.  You have been sick or have had a fever for a couple of days and are not getting better.  You have pain during any physical activity.  SEEK IMMEDIATE MEDICAL CARE IF:  You have difficulty breathing.  You have moderate to large ketone levels. MAKE SURE YOU:  Understand these instructions.  Will watch your condition.  Will get help right away if  you are not doing well or get worse. Document Released: 11/06/2005 Document Revised: 07/31/2012 Document Reviewed: 06/04/2012 [employer] Patient Information 2014 [employer].  

## 2014-03-11 NOTE — Assessment & Plan Note (Signed)
This has been present on her labs for one year I have asked her to come back in for further evaluation

## 2014-03-11 NOTE — Assessment & Plan Note (Signed)
She needs to start a statin but is still at some risk for pregnancy so I will hold on that for now

## 2014-03-11 NOTE — Assessment & Plan Note (Signed)
Her UA today only shows a few red blood cells, the urine culture is pending

## 2014-03-11 NOTE — Progress Notes (Signed)
Pre visit review using our clinic review tool, if applicable. No additional management support is needed unless otherwise documented below in the visit note. 

## 2014-03-11 NOTE — Progress Notes (Signed)
Subjective:    Patient ID: Kathryn Webb, female    DOB: [birth year], 53 y.o.   MRN: [id number]  Dysuria  This is a new problem. The current episode started in the past 7 days. The problem occurs intermittently. The problem has been unchanged. The pain is at a severity of 0/10. The patient is experiencing no pain. There has been no fever. The fever has been present for less than 1 day. She is not sexually active. There is no history of pyelonephritis. Associated symptoms include frequency and urgency. Pertinent negatives include no chills, discharge, flank pain, hematuria, hesitancy, nausea, possible pregnancy, sweats or vomiting. She has tried nothing for the symptoms. The treatment provided no relief. There is no history of catheterization, kidney stones, recurrent UTIs, a single kidney, urinary stasis or a urological procedure.      Review of Systems  Constitutional: Negative.  Negative for fever, chills, diaphoresis and fatigue.  HENT: Negative.   Eyes: Negative.   Respiratory: Negative.  Negative for apnea, cough, choking, chest tightness, shortness of breath, wheezing and stridor.   Cardiovascular: Negative.  Negative for chest pain, palpitations and leg swelling.  Gastrointestinal: Negative.  Negative for nausea, vomiting, abdominal pain, diarrhea, constipation and blood in stool.  Endocrine: Positive for polyuria. Negative for polydipsia and polyphagia.  Genitourinary: Positive for dysuria, urgency and frequency. Negative for hesitancy, hematuria, flank pain, decreased urine volume, enuresis, difficulty urinating and dyspareunia.  Musculoskeletal: Negative.   Allergic/Immunologic: Negative.   Neurological: Negative.   Hematological: Negative.  Negative for adenopathy. Does not bruise/bleed easily.  Psychiatric/Behavioral: Negative.        Objective:   Physical Exam  Vitals reviewed. Constitutional: She is oriented to person, place, and time. She appears well-developed and  well-nourished. No distress.  HENT:  Head: Normocephalic and atraumatic.  Mouth/Throat: Oropharynx is clear and moist. No oropharyngeal exudate.  Eyes: Conjunctivae are normal. Right eye exhibits no discharge. Left eye exhibits no discharge. No scleral icterus.  Neck: Normal range of motion. Neck supple. No JVD present. No tracheal deviation present. No thyromegaly present.  Cardiovascular: Normal rate, regular rhythm, normal heart sounds and intact distal pulses.  Exam reveals no gallop and no friction rub.   No murmur heard. Pulmonary/Chest: Effort normal and breath sounds normal. No stridor. No respiratory distress. She has no wheezes. She has no rales. She exhibits no tenderness.  Abdominal: Soft. Bowel sounds are normal. She exhibits no distension and no mass. There is no tenderness. There is no rebound and no guarding.  Musculoskeletal: Normal range of motion. She exhibits no edema and no tenderness.  Lymphadenopathy:    She has no cervical adenopathy.  Neurological: She is oriented to person, place, and time.  Skin: Skin is warm and dry. No rash noted. She is not diaphoretic. No erythema. No pallor.  Psychiatric: She has a normal mood and affect. Her behavior is normal. Judgment and thought content normal.     Lab Results  Component Value Date   WBC 6.1 03/07/2013   HGB 13.9 03/07/2013   HCT 41.3 03/07/2013   PLT 187.0 03/07/2013   GLUCOSE 99 03/07/2013   CHOL 235* 08/28/2013   TRIG 330.0* 08/28/2013   HDL 31.70* 08/28/2013   LDLDIRECT 158.8 08/28/2013   NA 142 03/07/2013   K 5.2* 03/07/2013   CL 107 03/07/2013   CREATININE 0.7 03/07/2013   BUN 10 03/07/2013   CO2 27 03/07/2013   TSH 1.27 03/07/2013   HGBA1C 6.7* 08/28/2013  Assessment & Plan:

## 2014-03-11 NOTE — Assessment & Plan Note (Signed)
Her blood sugars are well controlled 

## 2014-03-13 ENCOUNTER — Encounter: Payer: Self-pay | Admitting: Internal Medicine

## 2014-03-13 LAB — CULTURE, URINE COMPREHENSIVE
Colony Count: NO GROWTH
Organism ID, Bacteria: NO GROWTH

## 2014-05-04 ENCOUNTER — Encounter: Payer: BC Managed Care – PPO | Admitting: Internal Medicine

## 2014-05-17 ENCOUNTER — Other Ambulatory Visit: Payer: Self-pay | Admitting: Internal Medicine

## 2014-05-17 DIAGNOSIS — E119 Type 2 diabetes mellitus without complications: Secondary | ICD-10-CM

## 2014-05-18 NOTE — Telephone Encounter (Signed)
Last filled 08/29/13 by Lubertha Basque advise if okay to refill as you are listed as her PCP--last office visit with you was 03/11/14

## 2014-06-23 ENCOUNTER — Other Ambulatory Visit: Payer: Self-pay | Admitting: Physician Assistant

## 2014-06-23 ENCOUNTER — Other Ambulatory Visit (HOSPITAL_COMMUNITY)
Admission: RE | Admit: 2014-06-23 | Discharge: 2014-06-23 | Disposition: A | Discharge status: Home / Self Care | Payer: BC Managed Care – PPO | Source: Ambulatory Visit | Attending: Family Medicine | Admitting: Family Medicine

## 2014-06-23 DIAGNOSIS — Z124 Encounter for screening for malignant neoplasm of cervix: Secondary | ICD-10-CM | POA: Diagnosis not present

## 2014-06-26 LAB — CYTOLOGY - PAP

## 2014-08-10 ENCOUNTER — Other Ambulatory Visit: Payer: Self-pay | Admitting: Gastroenterology

## 2014-09-21 ENCOUNTER — Encounter: Payer: Self-pay | Admitting: Internal Medicine

## 2015-04-08 ENCOUNTER — Other Ambulatory Visit (HOSPITAL_COMMUNITY)
Admission: RE | Admit: 2015-04-08 | Discharge: 2015-04-08 | Disposition: A | Discharge status: Home / Self Care | Payer: BC Managed Care – PPO | Source: Ambulatory Visit | Attending: Family Medicine | Admitting: Family Medicine

## 2015-04-08 ENCOUNTER — Other Ambulatory Visit: Payer: Self-pay | Admitting: Physician Assistant

## 2015-04-08 DIAGNOSIS — Z124 Encounter for screening for malignant neoplasm of cervix: Secondary | ICD-10-CM | POA: Diagnosis not present

## 2015-04-12 LAB — CYTOLOGY - PAP

## 2016-02-01 ENCOUNTER — Other Ambulatory Visit: Payer: Self-pay | Admitting: Physician Assistant

## 2016-02-01 DIAGNOSIS — M25561 Pain in right knee: Secondary | ICD-10-CM

## 2016-02-04 ENCOUNTER — Other Ambulatory Visit: Payer: BC Managed Care – PPO

## 2016-04-15 DIAGNOSIS — M25561 Pain in right knee: Secondary | ICD-10-CM | POA: Diagnosis not present

## 2016-05-01 DIAGNOSIS — M25561 Pain in right knee: Secondary | ICD-10-CM | POA: Diagnosis not present

## 2016-05-05 DIAGNOSIS — M25561 Pain in right knee: Secondary | ICD-10-CM | POA: Diagnosis not present

## 2016-05-11 DIAGNOSIS — M25561 Pain in right knee: Secondary | ICD-10-CM | POA: Diagnosis not present

## 2016-05-14 DIAGNOSIS — R079 Chest pain, unspecified: Secondary | ICD-10-CM | POA: Diagnosis not present

## 2016-05-14 DIAGNOSIS — J209 Acute bronchitis, unspecified: Secondary | ICD-10-CM | POA: Diagnosis not present

## 2016-05-14 DIAGNOSIS — R0781 Pleurodynia: Secondary | ICD-10-CM | POA: Diagnosis not present

## 2016-05-14 DIAGNOSIS — M5489 Other dorsalgia: Secondary | ICD-10-CM | POA: Diagnosis not present

## 2016-06-08 DIAGNOSIS — M25561 Pain in right knee: Secondary | ICD-10-CM | POA: Diagnosis not present

## 2016-08-03 DIAGNOSIS — S83206A Unspecified tear of unspecified meniscus, current injury, right knee, initial encounter: Secondary | ICD-10-CM | POA: Diagnosis not present

## 2016-08-03 DIAGNOSIS — M25561 Pain in right knee: Secondary | ICD-10-CM | POA: Diagnosis not present

## 2016-08-09 DIAGNOSIS — M25561 Pain in right knee: Secondary | ICD-10-CM | POA: Diagnosis not present

## 2016-08-09 DIAGNOSIS — S83203D Other tear of unspecified meniscus, current injury, right knee, subsequent encounter: Secondary | ICD-10-CM | POA: Diagnosis not present

## 2016-08-15 DIAGNOSIS — S83203D Other tear of unspecified meniscus, current injury, right knee, subsequent encounter: Secondary | ICD-10-CM | POA: Diagnosis not present

## 2016-08-15 DIAGNOSIS — M25561 Pain in right knee: Secondary | ICD-10-CM | POA: Diagnosis not present

## 2016-08-30 DIAGNOSIS — S83203D Other tear of unspecified meniscus, current injury, right knee, subsequent encounter: Secondary | ICD-10-CM | POA: Diagnosis not present

## 2016-08-30 DIAGNOSIS — M25561 Pain in right knee: Secondary | ICD-10-CM | POA: Diagnosis not present

## 2016-09-07 DIAGNOSIS — M25561 Pain in right knee: Secondary | ICD-10-CM | POA: Diagnosis not present

## 2016-09-07 DIAGNOSIS — S83203D Other tear of unspecified meniscus, current injury, right knee, subsequent encounter: Secondary | ICD-10-CM | POA: Diagnosis not present

## 2016-09-14 DIAGNOSIS — M25561 Pain in right knee: Secondary | ICD-10-CM | POA: Diagnosis not present

## 2016-09-14 DIAGNOSIS — S83203D Other tear of unspecified meniscus, current injury, right knee, subsequent encounter: Secondary | ICD-10-CM | POA: Diagnosis not present

## 2016-12-12 DIAGNOSIS — S83206A Unspecified tear of unspecified meniscus, current injury, right knee, initial encounter: Secondary | ICD-10-CM | POA: Diagnosis not present

## 2016-12-12 DIAGNOSIS — M25561 Pain in right knee: Secondary | ICD-10-CM | POA: Diagnosis not present

## 2016-12-12 DIAGNOSIS — M5442 Lumbago with sciatica, left side: Secondary | ICD-10-CM | POA: Diagnosis not present

## 2016-12-12 DIAGNOSIS — M5441 Lumbago with sciatica, right side: Secondary | ICD-10-CM | POA: Diagnosis not present

## 2017-01-03 ENCOUNTER — Other Ambulatory Visit: Payer: Self-pay | Admitting: Orthopedic Surgery

## 2017-01-19 ENCOUNTER — Encounter (HOSPITAL_BASED_OUTPATIENT_CLINIC_OR_DEPARTMENT_OTHER): Payer: Self-pay | Admitting: *Deleted

## 2017-01-23 ENCOUNTER — Encounter (HOSPITAL_BASED_OUTPATIENT_CLINIC_OR_DEPARTMENT_OTHER)
Admission: RE | Admit: 2017-01-23 | Discharge: 2017-01-23 | Disposition: A | Discharge status: Home / Self Care | Payer: BC Managed Care – PPO | Source: Ambulatory Visit | Attending: Orthopedic Surgery | Admitting: Orthopedic Surgery

## 2017-01-23 DIAGNOSIS — Z7984 Long term (current) use of oral hypoglycemic drugs: Secondary | ICD-10-CM | POA: Diagnosis not present

## 2017-01-23 DIAGNOSIS — F17210 Nicotine dependence, cigarettes, uncomplicated: Secondary | ICD-10-CM | POA: Diagnosis not present

## 2017-01-23 DIAGNOSIS — E119 Type 2 diabetes mellitus without complications: Secondary | ICD-10-CM | POA: Diagnosis not present

## 2017-01-23 DIAGNOSIS — Z6833 Body mass index (BMI) 33.0-33.9, adult: Secondary | ICD-10-CM | POA: Diagnosis not present

## 2017-01-23 DIAGNOSIS — Z79899 Other long term (current) drug therapy: Secondary | ICD-10-CM | POA: Diagnosis not present

## 2017-01-23 DIAGNOSIS — S83241A Other tear of medial meniscus, current injury, right knee, initial encounter: Secondary | ICD-10-CM | POA: Diagnosis not present

## 2017-01-23 DIAGNOSIS — M94261 Chondromalacia, right knee: Secondary | ICD-10-CM | POA: Diagnosis not present

## 2017-01-23 DIAGNOSIS — S83281A Other tear of lateral meniscus, current injury, right knee, initial encounter: Secondary | ICD-10-CM | POA: Diagnosis not present

## 2017-01-23 DIAGNOSIS — X58XXXA Exposure to other specified factors, initial encounter: Secondary | ICD-10-CM | POA: Diagnosis not present

## 2017-01-23 LAB — BASIC METABOLIC PANEL
ANION GAP: 8 (ref 5–15)
BUN: 8 mg/dL (ref 6–20)
CO2: 25 mmol/L (ref 22–32)
Calcium: 11.8 mg/dL — ABNORMAL HIGH (ref 8.9–10.3)
Chloride: 104 mmol/L (ref 101–111)
Creatinine, Ser: 0.56 mg/dL (ref 0.44–1.00)
GFR calc Af Amer: >60 mL/min (ref >60)
Glucose, Bld: 130 mg/dL — ABNORMAL HIGH (ref 65–99)
POTASSIUM: 4.6 mmol/L (ref 3.5–5.1)
SODIUM: 137 mmol/L (ref 135–145)

## 2017-01-24 ENCOUNTER — Encounter (HOSPITAL_BASED_OUTPATIENT_CLINIC_OR_DEPARTMENT_OTHER): Payer: Self-pay | Admitting: Certified Registered"

## 2017-01-24 ENCOUNTER — Ambulatory Visit (HOSPITAL_BASED_OUTPATIENT_CLINIC_OR_DEPARTMENT_OTHER)
Admission: RE | Admit: 2017-01-24 | Discharge: 2017-01-24 | Disposition: A | Discharge status: Home / Self Care | Payer: BC Managed Care – PPO | Source: Ambulatory Visit | Attending: Orthopedic Surgery | Admitting: Orthopedic Surgery

## 2017-01-24 ENCOUNTER — Encounter (HOSPITAL_BASED_OUTPATIENT_CLINIC_OR_DEPARTMENT_OTHER)
Admission: RE | Disposition: A | Discharge status: Home / Self Care | Payer: Self-pay | Source: Ambulatory Visit | Attending: Orthopedic Surgery

## 2017-01-24 ENCOUNTER — Ambulatory Visit (HOSPITAL_BASED_OUTPATIENT_CLINIC_OR_DEPARTMENT_OTHER): Payer: BC Managed Care – PPO | Admitting: Certified Registered"

## 2017-01-24 DIAGNOSIS — Z79899 Other long term (current) drug therapy: Secondary | ICD-10-CM | POA: Diagnosis not present

## 2017-01-24 DIAGNOSIS — S83281A Other tear of lateral meniscus, current injury, right knee, initial encounter: Secondary | ICD-10-CM | POA: Diagnosis not present

## 2017-01-24 DIAGNOSIS — M94261 Chondromalacia, right knee: Secondary | ICD-10-CM | POA: Insufficient documentation

## 2017-01-24 DIAGNOSIS — F17210 Nicotine dependence, cigarettes, uncomplicated: Secondary | ICD-10-CM | POA: Insufficient documentation

## 2017-01-24 DIAGNOSIS — M23341 Other meniscus derangements, anterior horn of lateral meniscus, right knee: Secondary | ICD-10-CM | POA: Diagnosis not present

## 2017-01-24 DIAGNOSIS — M23321 Other meniscus derangements, posterior horn of medial meniscus, right knee: Secondary | ICD-10-CM | POA: Diagnosis not present

## 2017-01-24 DIAGNOSIS — M2241 Chondromalacia patellae, right knee: Secondary | ICD-10-CM | POA: Diagnosis present

## 2017-01-24 DIAGNOSIS — E119 Type 2 diabetes mellitus without complications: Secondary | ICD-10-CM | POA: Insufficient documentation

## 2017-01-24 DIAGNOSIS — Z7984 Long term (current) use of oral hypoglycemic drugs: Secondary | ICD-10-CM | POA: Insufficient documentation

## 2017-01-24 DIAGNOSIS — Z6833 Body mass index (BMI) 33.0-33.9, adult: Secondary | ICD-10-CM | POA: Insufficient documentation

## 2017-01-24 DIAGNOSIS — S83241A Other tear of medial meniscus, current injury, right knee, initial encounter: Secondary | ICD-10-CM | POA: Diagnosis not present

## 2017-01-24 DIAGNOSIS — X58XXXA Exposure to other specified factors, initial encounter: Secondary | ICD-10-CM | POA: Insufficient documentation

## 2017-01-24 HISTORY — DX: Other tear of medial meniscus, current injury, unspecified knee, initial encounter: S83.249A

## 2017-01-24 HISTORY — PX: KNEE ARTHROSCOPY WITH LATERAL MENISECTOMY: SHX6193

## 2017-01-24 HISTORY — PX: CHONDROPLASTY: SHX5177

## 2017-01-24 HISTORY — DX: Unspecified osteoarthritis, unspecified site: M19.90

## 2017-01-24 HISTORY — DX: Hyperlipidemia, unspecified: E78.5

## 2017-01-24 HISTORY — PX: KNEE ARTHROSCOPY WITH MEDIAL MENISECTOMY: SHX5651

## 2017-01-24 LAB — GLUCOSE, CAPILLARY
GLUCOSE-CAPILLARY: 120 mg/dL — AB (ref 65–99)
Glucose-Capillary: 105 mg/dL — ABNORMAL HIGH (ref 65–99)

## 2017-01-24 SURGERY — ARTHROSCOPY, KNEE, WITH MEDIAL MENISCECTOMY
Anesthesia: General | Site: Knee | Laterality: Right

## 2017-01-24 MED ORDER — CEFAZOLIN SODIUM-DEXTROSE 2-4 GM/100ML-% IV SOLN
INTRAVENOUS | Status: AC
  Filled 2017-01-24: qty 100

## 2017-01-24 MED ORDER — STERILE WATER FOR IRRIGATION IR SOLN
Status: DC | PRN
  Administered 2017-01-24: 1000 mL

## 2017-01-24 MED ORDER — LIDOCAINE HCL (CARDIAC) 20 MG/ML IV SOLN
INTRAVENOUS | Status: DC | PRN
  Administered 2017-01-24: 60 mg via INTRAVENOUS

## 2017-01-24 MED ORDER — SODIUM CHLORIDE 0.9 % IR SOLN
Status: DC | PRN
  Administered 2017-01-24 (×2): 3000 mL

## 2017-01-24 MED ORDER — DEXAMETHASONE SODIUM PHOSPHATE 10 MG/ML IJ SOLN
INTRAMUSCULAR | Status: AC
  Filled 2017-01-24: qty 1

## 2017-01-24 MED ORDER — BUPIVACAINE HCL (PF) 0.5 % IJ SOLN
INTRAMUSCULAR | Status: AC
  Filled 2017-01-24: qty 30

## 2017-01-24 MED ORDER — CEFAZOLIN SODIUM-DEXTROSE 2-4 GM/100ML-% IV SOLN
2.0000 g | INTRAVENOUS | Status: AC
  Administered 2017-01-24: 2 g via INTRAVENOUS

## 2017-01-24 MED ORDER — ONDANSETRON HCL 4 MG/2ML IJ SOLN
INTRAMUSCULAR | Status: AC
  Filled 2017-01-24: qty 2

## 2017-01-24 MED ORDER — SCOPOLAMINE 1 MG/3DAYS TD PT72: 1.0000 | MEDICATED_PATCH | Freq: Once | TRANSDERMAL | Status: DC | PRN

## 2017-01-24 MED ORDER — CHLORHEXIDINE GLUCONATE 4 % EX LIQD: 60.0000 mL | Freq: Once | CUTANEOUS | Status: DC

## 2017-01-24 MED ORDER — PROMETHAZINE HCL 25 MG/ML IJ SOLN: 6.2500 mg | INTRAMUSCULAR | Status: DC | PRN

## 2017-01-24 MED ORDER — HYDROMORPHONE HCL 1 MG/ML IJ SOLN
0.2500 mg | INTRAMUSCULAR | Status: DC | PRN
  Administered 2017-01-24: 0.5 mg via INTRAVENOUS

## 2017-01-24 MED ORDER — FENTANYL CITRATE (PF) 100 MCG/2ML IJ SOLN: 25.0000 ug | INTRAMUSCULAR | Status: DC | PRN

## 2017-01-24 MED ORDER — DEXAMETHASONE SODIUM PHOSPHATE 10 MG/ML IJ SOLN
INTRAMUSCULAR | Status: DC | PRN
  Administered 2017-01-24: 4 mg via INTRAVENOUS

## 2017-01-24 MED ORDER — HYDROCODONE-ACETAMINOPHEN 5-325 MG PO TABS: 1.0000 | ORAL_TABLET | Freq: Four times a day (QID) | ORAL | 0 refills | Status: DC | PRN

## 2017-01-24 MED ORDER — FENTANYL CITRATE (PF) 100 MCG/2ML IJ SOLN
INTRAMUSCULAR | Status: AC
  Filled 2017-01-24: qty 2

## 2017-01-24 MED ORDER — MIDAZOLAM HCL 2 MG/2ML IJ SOLN: 0.5000 mg | Freq: Once | INTRAMUSCULAR | Status: DC | PRN

## 2017-01-24 MED ORDER — EPINEPHRINE 30 MG/30ML IJ SOLN
INTRAMUSCULAR | Status: AC
  Filled 2017-01-24: qty 1

## 2017-01-24 MED ORDER — HYDROMORPHONE HCL 1 MG/ML IJ SOLN
INTRAMUSCULAR | Status: AC
  Filled 2017-01-24: qty 1

## 2017-01-24 MED ORDER — MIDAZOLAM HCL 2 MG/2ML IJ SOLN
INTRAMUSCULAR | Status: AC
  Filled 2017-01-24: qty 2

## 2017-01-24 MED ORDER — FENTANYL CITRATE (PF) 100 MCG/2ML IJ SOLN
50.0000 ug | INTRAMUSCULAR | Status: AC | PRN
  Administered 2017-01-24: 50 ug via INTRAVENOUS
  Administered 2017-01-24 (×2): 25 ug via INTRAVENOUS
  Administered 2017-01-24 (×2): 50 ug via INTRAVENOUS

## 2017-01-24 MED ORDER — BUPIVACAINE HCL (PF) 0.5 % IJ SOLN
INTRAMUSCULAR | Status: DC | PRN
  Administered 2017-01-24: 20 mL

## 2017-01-24 MED ORDER — EPINEPHRINE PF 1 MG/ML IJ SOLN
INTRAMUSCULAR | Status: DC | PRN
  Administered 2017-01-24: 2 mL

## 2017-01-24 MED ORDER — ONDANSETRON HCL 4 MG/2ML IJ SOLN
INTRAMUSCULAR | Status: DC | PRN
  Administered 2017-01-24: 4 mg via INTRAVENOUS

## 2017-01-24 MED ORDER — PROPOFOL 500 MG/50ML IV EMUL
INTRAVENOUS | Status: AC
  Filled 2017-01-24: qty 50

## 2017-01-24 MED ORDER — MEPERIDINE HCL 25 MG/ML IJ SOLN: 6.2500 mg | INTRAMUSCULAR | Status: DC | PRN

## 2017-01-24 MED ORDER — LIDOCAINE 2% (20 MG/ML) 5 ML SYRINGE
INTRAMUSCULAR | Status: AC
  Filled 2017-01-24: qty 5

## 2017-01-24 MED ORDER — HYDROCODONE-ACETAMINOPHEN 5-325 MG PO TABS
ORAL_TABLET | ORAL | Status: AC
  Filled 2017-01-24: qty 1

## 2017-01-24 MED ORDER — HYDROCODONE-ACETAMINOPHEN 5-325 MG PO TABS
1.0000 | ORAL_TABLET | Freq: Once | ORAL | Status: AC | PRN
  Administered 2017-01-24: 1 via ORAL

## 2017-01-24 MED ORDER — BUPIVACAINE HCL (PF) 0.25 % IJ SOLN
INTRAMUSCULAR | Status: AC
  Filled 2017-01-24: qty 30

## 2017-01-24 MED ORDER — LACTATED RINGERS IV SOLN
INTRAVENOUS | Status: DC
  Administered 2017-01-24 (×2): via INTRAVENOUS

## 2017-01-24 MED ORDER — PROPOFOL 10 MG/ML IV BOLUS
INTRAVENOUS | Status: DC | PRN
  Administered 2017-01-24: 200 mg via INTRAVENOUS

## 2017-01-24 MED ORDER — MIDAZOLAM HCL 2 MG/2ML IJ SOLN
1.0000 mg | INTRAMUSCULAR | Status: DC | PRN
  Administered 2017-01-24: 2 mg via INTRAVENOUS

## 2017-01-24 SURGICAL SUPPLY — 40 items
BANDAGE ACE 6X5 VEL STRL LF (GAUZE/BANDAGES/DRESSINGS) ×3 IMPLANT
BLADE 4.2CUDA (BLADE) IMPLANT
BLADE GREAT WHITE 4.2 (BLADE) ×3 IMPLANT
CUTTER MENISCUS  4.2MM (BLADE)
CUTTER MENISCUS 4.2MM (BLADE) IMPLANT
DRAPE ARTHROSCOPY W/POUCH 114 (DRAPES) ×3 IMPLANT
DRSG EMULSION OIL 3X3 NADH (GAUZE/BANDAGES/DRESSINGS) ×3 IMPLANT
DURAPREP 26ML APPLICATOR (WOUND CARE) ×3 IMPLANT
ELECT MENISCUS 165MM 90D (ELECTRODE) IMPLANT
ELECT REM PT RETURN 9FT ADLT (ELECTROSURGICAL)
ELECTRODE REM PT RTRN 9FT ADLT (ELECTROSURGICAL) IMPLANT
GAUZE SPONGE 4X4 12PLY STRL (GAUZE/BANDAGES/DRESSINGS) ×3 IMPLANT
GLOVE BIOGEL PI IND STRL 7.0 (GLOVE) ×2 IMPLANT
GLOVE BIOGEL PI IND STRL 8 (GLOVE) ×4 IMPLANT
GLOVE BIOGEL PI INDICATOR 7.0 (GLOVE) ×1
GLOVE BIOGEL PI INDICATOR 8 (GLOVE) ×2
GLOVE ECLIPSE 6.5 STRL STRAW (GLOVE) ×3 IMPLANT
GLOVE ECLIPSE 7.5 STRL STRAW (GLOVE) ×9 IMPLANT
GLOVE SURG SS PI 6.5 STRL IVOR (GLOVE) ×3 IMPLANT
GOWN STRL REUS W/ TWL LRG LVL3 (GOWN DISPOSABLE) ×2 IMPLANT
GOWN STRL REUS W/ TWL XL LVL3 (GOWN DISPOSABLE) ×2 IMPLANT
GOWN STRL REUS W/TWL LRG LVL3 (GOWN DISPOSABLE) ×1
GOWN STRL REUS W/TWL XL LVL3 (GOWN DISPOSABLE) ×4 IMPLANT
HOLDER KNEE FOAM BLUE (MISCELLANEOUS) ×3 IMPLANT
KNEE WRAP E Z 3 GEL PACK (MISCELLANEOUS) ×3 IMPLANT
MANIFOLD NEPTUNE II (INSTRUMENTS) ×3 IMPLANT
NDL SAFETY ECLIPSE 18X1.5 (NEEDLE) ×2 IMPLANT
NEEDLE HYPO 18GX1.5 SHARP (NEEDLE) ×1
PACK ARTHROSCOPY DSU (CUSTOM PROCEDURE TRAY) ×3 IMPLANT
PACK BASIN DAY SURGERY FS (CUSTOM PROCEDURE TRAY) ×3 IMPLANT
PAD CAST 4YDX4 CTTN HI CHSV (CAST SUPPLIES) ×2 IMPLANT
PADDING CAST COTTON 4X4 STRL (CAST SUPPLIES) ×1
PENCIL BUTTON HOLSTER BLD 10FT (ELECTRODE) IMPLANT
SET ARTHROSCOPY TUBING (MISCELLANEOUS) ×1
SET ARTHROSCOPY TUBING LN (MISCELLANEOUS) ×2 IMPLANT
SUT ETHILON 4 0 PS 2 18 (SUTURE) IMPLANT
SYR 5ML LL (SYRINGE) ×3 IMPLANT
TOWEL OR 17X24 6PK STRL BLUE (TOWEL DISPOSABLE) ×3 IMPLANT
TOWEL OR NON WOVEN STRL DISP B (DISPOSABLE) ×3 IMPLANT
WATER STERILE IRR 1000ML POUR (IV SOLUTION) ×3 IMPLANT

## 2017-01-24 NOTE — Anesthesia Procedure Notes (Signed)
Procedure Name: LMA Insertion Date/Time: 01/24/2017 10:23 AM Performed by: Sundi Slevin D Pre-anesthesia Checklist: Patient identified, Emergency Drugs available, Suction available and Patient being monitored Patient Re-evaluated:Patient Re-evaluated prior to inductionOxygen Delivery Method: Circle system utilized Preoxygenation: Pre-oxygenation with 100% oxygen Intubation Type: IV induction Ventilation: Mask ventilation without difficulty LMA: LMA inserted LMA Size: 4.0 Number of attempts: 1 Airway Equipment and Method: Bite block Placement Confirmation: positive ETCO2 Tube secured with: Tape Dental Injury: Teeth and Oropharynx as per pre-operative assessment

## 2017-01-24 NOTE — Discharge Instructions (Signed)
POST-OP KNEE ARTHROSCOPY INSTRUCTIONS  Dr. Alain Marion PA-C  Pain You will be expected to have a moderate amount of pain in the affected knee for approximately two weeks. However, the first two days will be the most severe pain. A prescription has been provided to take as needed for the pain. The pain can be reduced by applying ice packs to the knee for the first 1-2 weeks post surgery. Also, keeping the leg elevated on pillows will help alleviate the pain. If you develop any acute pain or swelling in your calf muscle, please call the doctor.  Activity It is preferred that you stay at bed rest for approximately 24 hours. However, you may go to the bathroom with help. Weight bearing as tolerated. You may begin the knee exercises the day of surgery. Discontinue crutches as the knee pain resolves.  Dressing Keep the dressing dry. If the ace bandage should wrinkle or roll up, this can be rewrapped to prevent ridges in the bandage. You may remove all dressings in 48 hours,  apply bandaids Post Anesthesia Home Care Instructions  Activity: Get plenty of rest for the remainder of the day. A responsible adult should stay with you for 24 hours following the procedure.  For the next 24 hours, DO NOT: -Drive a car -Paediatric nurse -Drink alcoholic beverages -Take any medication unless instructed by your physician -Make any legal decisions or sign important papers.  Meals: Start with liquid foods such as gelatin or soup. Progress to regular foods as tolerated. Avoid greasy, spicy, heavy foods. If nausea and/or vomiting occur, drink only clear liquids until the nausea and/or vomiting subsides. Call your physician if vomiting continues.  Special Instructions/Symptoms: Your throat may feel dry or sore from the anesthesia or the breathing tube placed in your throat during surgery. If this causes discomfort, gargle with warm salt water. The discomfort should disappear within 24 hours.  If you  had a scopolamine patch placed behind your ear for the management of post- operative nausea and/or vomiting:  1. The medication in the patch is effective for 72 hours, after which it should be removed.  Wrap patch in a tissue and discard in the trash. Wash hands thoroughly with soap and water. 2. You may remove the patch earlier than 72 hours if you experience unpleasant side effects which may include dry mouth, dizziness or visual disturbances. 3. Avoid touching the patch. Wash your hands with soap and water after contact with the patch.    to each wound. You may shower on the 4th day after surgery but no tub bath.  Symptoms to report to your doctor Extreme pain Extreme swelling Temperature above 101 degrees Change in the feeling, color, or movement of your toes Redness, heat, or swelling at your incision  Exercise If is preferred that as soon as possible you try to do a straight leg raise without bending the knee and concentrate on bringing the heel of your foot off the bed up to approximately 45 degrees and hold for the count of 10 seconds. Repeat this at least 10 times three or four times per day. Additional exercises are provided below.  You are encouraged to bend the knee as tolerated.  Follow-Up Call to schedule a follow-up appointment in 5-7 days. Our office # is [phone].  POST-OP EXERCISES  Short Arc Quads  1. Lie on back with legs straight. Place towel roll under thigh, just above knee. 2. Tighten thigh muscles to straighten knee and lift heel off  bed. 3. Hold for slow count of five, then lower. 4. Do three sets of ten    Straight Leg Raises  1. Lie on back with operative leg straight and other leg bent. 2. Keeping operative leg completely straight, slowly lift operative leg so foot is 5 inches off bed. 3. Hold for slow count of five, then lower. 4. Do three sets of ten.    DO BOTH EXERCISES 2 TIMES A DAY  Ankle Pumps  Work/move the operative ankle and foot  up and down 10 times every hour while awake.

## 2017-01-24 NOTE — Brief Op Note (Signed)
01/24/2017  5:07 PM  PATIENT:  Kathryn Webb  56 y.o. female  PRE-OPERATIVE DIAGNOSIS:  RIGHT KNEE LATERAL MENISCUS TEAR   POST-OPERATIVE DIAGNOSIS:  RIGHT KNEE LATERAL MENISCUS TEAR  PROCEDURE:  Procedure(s): ARTHROSCOPY KNEE, PARTIAL MEDIAL AND PARTICAL LATERAL MENISCECTOMY, CHONDROPLASTY PATELLA  FEMORAL JOINT (Right)  SURGEON:  Surgeon(s) and Role:    * Dorna Leitz, MD - Primary  PHYSICIAN ASSISTANT:   ASSISTANTS: bethune   ANESTHESIA:   general  EBL:  Total I/O In: 1462 [P.O.:462; I.V.:1000] Out: -   BLOOD ADMINISTERED:none  DRAINS: none   LOCAL MEDICATIONS USED:  MARCAINE     SPECIMEN:  No Specimen  DISPOSITION OF SPECIMEN:  N/A  COUNTS:  YES  TOURNIQUET:  * No tourniquets in log *  DICTATION: .Other Dictation: Dictation Number [id number]  PLAN OF CARE: Discharge to home after PACU  PATIENT DISPOSITION:  PACU - hemodynamically stable.   Delay start of Pharmacological VTE agent (>24hrs) due to surgical blood loss or risk of bleeding: no

## 2017-01-24 NOTE — Transfer of Care (Signed)
Immediate Anesthesia Transfer of Care Note  Patient: Kathryn Webb  Procedure(s) Performed: Procedure(s): ARTHROSCOPY KNEE, PARTIAL MEDIAL AND PARTICAL LATERAL MENISCECTOMY, CHONDROPLASTY PATELLA  FEMORAL JOINT (Right)  Patient Location: PACU  Anesthesia Type:General  Level of Consciousness: awake, alert , oriented and patient cooperative  Airway & Oxygen Therapy: Patient Spontanous Breathing and Patient connected to face mask oxygen  Post-op Assessment: Report given to RN and Post -op Vital signs reviewed and stable  Post vital signs: Reviewed and stable  Last Vitals:  Vitals:   01/24/17 0844  BP: (!) 145/61  Pulse: (!) 57  Resp: 18  Temp: 36.8 C    Last Pain:  Vitals:   01/24/17 0844  TempSrc: Oral  PainSc: 5       Patients Stated Pain Goal: 2 (21/03/12 8118)  Complications: No apparent anesthesia complications

## 2017-01-24 NOTE — H&P (Signed)
PREOPERATIVE H&P  Chief Complaint: r kne pain  HPI: Kathryn Webb is a 56 y.o. female who presents for evaluation of r knee pain. It has been present for greater than 3 months and has been worsening. She has failed conservative measures. Pain is rated as moderate.  Past Medical History:  Diagnosis Date  . Acute medial meniscus tear   . Arthritis   . Diabetes mellitus without complication ([place])   . Hidradenitis suppurativa   . Serum lipids high    Past Surgical History:  Procedure Laterality Date  . AXILLARY HIDRADENITIS EXCISION    . PERINEAL HIDRADENITIS EXCISION     Social History   Social History  . Marital status: Single    Spouse name: N/A  . Number of children: N/A  . Years of education: 75   Occupational History  . 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 Smoking status: Current Every Day Smoker    Packs/day: 0.25  . Smokeless tobacco: Never Used  . Alcohol use Yes     Comment: social  . Drug use: No  . Sexual activity: Not Asked   Other Topics Concern  . None   Social History Narrative   Regular exercise-yes   Caffiene Use-yes   Family History  Problem Relation Age of Onset  . Hypertension Father   . Kidney disease Father   . Diabetes Father   . Heart disease Mother   . Hypertension Mother   . Diabetes Mother   . Diabetes Brother   . Cancer Paternal Uncle   . Stroke Neg Hx    No Known Allergies Prior to Admission medications   Medication Sig Start Date End Date Taking? Authorizing Provider  atorvastatin (LIPITOR) 20 MG tablet Take 20 mg by mouth daily.   Yes Historical Provider, MD  Blood Glucose Monitoring Suppl (CONTOUR NEXT EZ MONITOR) W/DEVICE KIT 1 Act by Does not apply route 2 (two) times daily. 03/11/14  Yes Janith Lima, MD  cyanocobalamin 500 MCG tablet Take 500 mcg by mouth daily.   Yes Historical Provider, MD  glucosamine-chondroitin 500-400 MG tablet Take 1 tablet by mouth 3 (three) times daily.    Yes Historical Provider, MD  glucose blood (BAYER CONTOUR NEXT TEST) test strip Use BID 03/11/14  Yes Janith Lima, MD  metFORMIN (GLUCOPHAGE) 500 MG tablet TAKE 1 TABLET (500 MG TOTAL) BY MOUTH 2 (TWO) TIMES DAILY WITH A MEAL.   Yes Janith Lima, MD  Specialty Vitamins Products (BIOTIN PLUS KERATIN PO) Take by mouth.   Yes Historical Provider, MD  traMADol (ULTRAM) 50 MG tablet Take 1 tablet (50 mg total) by mouth every 8 (eight) hours as needed for pain. 08/29/13   Jearld Fenton, NP     Positive ROS: none  All other systems have been reviewed and were otherwise negative with the exception of those mentioned in the HPI and as above.  Physical Exam: Vitals:   01/24/17 0844  BP: (!) 145/61  Pulse: (!) 57  Resp: 18  Temp: 98.2 F (36.8 C)    General: Alert, no acute distress Cardiovascular: No pedal edema Respiratory: No cyanosis, no use of accessory musculature GI: No organomegaly, abdomen is soft and non-tender Skin: No lesions in the area of chief complaint Neurologic: Sensation intact distally Psychiatric: Patient is competent for consent with normal mood and affect Lymphatic: No axillary or cervical lymphadenopathy  MUSCULOSKELETAL: r knee +mcmurray -instability.  Painful romt  Assessment/Plan: RIGHT KNEE LATERAL MENISCUS TEAR  Plan for Procedure(s): ARTHROSCOPY KNEE  The risks benefits and alternatives were discussed with the patient including but not limited to the risks of nonoperative treatment, versus surgical intervention including infection, bleeding, nerve injury, malunion, nonunion, hardware prominence, hardware failure, need for hardware removal, blood clots, cardiopulmonary complications, morbidity, mortality, among others, and they were willing to proceed.  Predicted outcome is good, although there will be at least a six to nine month expected recovery.  [name] L, MD 01/24/2017 10:13 AM

## 2017-01-24 NOTE — Anesthesia Preprocedure Evaluation (Addendum)
Anesthesia Evaluation  Patient identified by MRN, date of birth, ID band Patient awake    Reviewed: Allergy & Precautions, H&P , Patient's Chart, lab work & pertinent test results, reviewed documented beta blocker date and time   Airway Mallampati: II  TM Distance: >3 FB Neck ROM: full    Dental no notable dental hx.    Pulmonary Current Smoker,    Pulmonary exam normal breath sounds clear to auscultation       Cardiovascular negative cardio ROS   Rhythm:regular Rate:Normal     Neuro/Psych negative neurological ROS     GI/Hepatic negative GI ROS, Neg liver ROS,   Endo/Other  diabetes, Oral Hypoglycemic AgentsMorbid obesity  Renal/GU negative Renal ROS     Musculoskeletal  (+) Arthritis ,   Abdominal   Peds  Hematology negative hematology ROS (+)   Anesthesia Other Findings   Reproductive/Obstetrics                            Anesthesia Physical Anesthesia Plan  ASA: III  Anesthesia Plan: General   Post-op Pain Management:    Induction: Intravenous  Airway Management Planned: LMA  Additional Equipment:   Intra-op Plan:   Post-operative Plan:   Informed Consent: I have reviewed the patients History and Physical, chart, labs and discussed the procedure including the risks, benefits and alternatives for the proposed anesthesia with the patient or authorized representative who has indicated his/her understanding and acceptance.   Dental Advisory Given  Plan Discussed with: CRNA and Surgeon  Anesthesia Plan Comments: (Discussed GA with LMA, possible sore throat, potential need to switch to ETT, N/V, pulmonary aspiration. Questions answered. )       Anesthesia Quick Evaluation

## 2017-01-24 NOTE — Anesthesia Postprocedure Evaluation (Signed)
Anesthesia Post Note  Patient: Kathryn Webb  Procedure(s) Performed: Procedure(s) (LRB): ARTHROSCOPY KNEE, PARTIAL MEDIAL AND PARTICAL LATERAL MENISCECTOMY, CHONDROPLASTY PATELLA  FEMORAL JOINT (Right)  Patient location during evaluation: PACU Anesthesia Type: General Level of consciousness: awake and alert, oriented and patient cooperative Pain management: pain level controlled Vital Signs Assessment: post-procedure vital signs reviewed and stable Respiratory status: spontaneous breathing, nonlabored ventilation and respiratory function stable Cardiovascular status: blood pressure returned to baseline and stable Postop Assessment: no signs of nausea or vomiting Anesthetic complications: no       Last Vitals:  Vitals:   01/24/17 1145 01/24/17 1215  BP: (!) 171/73 (!) 161/66  Pulse: (!) 52 (!) 56  Resp: (!) 9 18  Temp:  36.3 C    Last Pain:  Vitals:   01/24/17 1215  TempSrc:   PainSc: 3                  Stephenie Navejas,E. Bonna Steury

## 2017-01-25 NOTE — Op Note (Signed)
Kathryn Webb, Kathryn Webb               ACCOUNT NO.:  [id number]  MEDICAL RECORD NO.:  [id number]  LOCATION:                                 FACILITY:  PHYSICIAN:  Alta Corning, M.D.        DATE OF BIRTH:  DATE OF PROCEDURE:  01/24/2017 DATE OF DISCHARGE:                              OPERATIVE REPORT   PREOPERATIVE DIAGNOSIS:  Lateral meniscal tear.  POSTOPERATIVE DIAGNOSES: 1. Medial meniscal tear and lateral meniscal tear. 2. Chondromalacia of the medial compartment, lateral compartment, and     patellofemoral compartments.  PROCEDURE: 1. Operative knee arthroscopy with partial medial and partial lateral     meniscectomy, posterior horn on the medial side and anterior horn     on the lateral side. 2. Chondroplasty of lateral compartment down to bleeding bone. 3. Chondroplasty, medial and patellofemoral, back to a smooth and     stable rim of articular cartilage.  SURGEON:  Alta Corning, M.D.  ASSISTANT:  Gary Fleet, PA.  ANESTHESIA:  General.  BRIEF HISTORY:  Kathryn Webb is a 56 year old female with long history of significant complaints of right knee pain.  She had been treated conservatively for prolonged period of time.  After failure of conservative care, MRI was obtained, which showed an anterior horn lateral meniscal tear as well as chondromalacia.  She was brought to the operating room for operative knee arthroscopy after failure of conservative care.  DESCRIPTION OF PROCEDURE:  The patient was brought to the operating room.  After adequate anesthesia was obtained with general anesthetic, the patient was placed supine on the operating room table.  The right leg was prepped and draped in the usual sterile fashion.  Following this, routine arthroscopic examination of the knee revealed that there was obvious chondromalacia of the patellofemoral compartment.  It was debrided back to a smooth and stable rim of articular cartilage. Attention was turned to the medial  compartment, where there was an obvious posterior horn medial meniscal tear.  This was debrided with a straight biting forceps back to smooth rim and a suction shaver was used to make the final smoothing of this meniscus.  Once this was done, attention was turned towards the medial femoral condyle where there was some chondromalacia, which was debrided back to a smooth and stable rim articular cartilage.  Attention was turned to the ACL, normal. Attention turned laterally where the lateral compartment was blocked by anterior horn meniscal tear.  This was debrided back to a smooth and stable rim with straight biting forceps and left-hand side biting forceps.  Once this was completed, attention was turned towards the lateral tibial plateau, which was grade 3 and grade 4 change.  It was debrided back to stable rim of articular cartilage down to bleeding bone where necessary.  Attention was turned to the medial lateral femoral condyle where there was chondromalacia, which was debrided as well. Once this was done, the knee was copiously irrigated and suctioned dry. Final check was made for loosen fragment and pieces of cartilage and meniscus, seeing none.  The knee was again irrigated and suctioned dry.  A sterile compressive dressing was applied.  The  patient was taken to the recovery room and she was noted to be in satisfactory condition.  Estimated blood loss for the procedure was minimal.     Alta Corning, M.D.   ______________________________ Alta Corning, M.D.    Corliss Skains  D:  01/24/2017  T:  01/24/2017  Job:  [id number]  cc:   Alta Corning, M.D.

## 2017-01-28 ENCOUNTER — Encounter (HOSPITAL_BASED_OUTPATIENT_CLINIC_OR_DEPARTMENT_OTHER): Payer: Self-pay | Admitting: Orthopedic Surgery

## 2017-01-30 DIAGNOSIS — M25561 Pain in right knee: Secondary | ICD-10-CM | POA: Diagnosis not present

## 2017-01-30 DIAGNOSIS — M25661 Stiffness of right knee, not elsewhere classified: Secondary | ICD-10-CM | POA: Diagnosis not present

## 2017-02-05 ENCOUNTER — Encounter (HOSPITAL_BASED_OUTPATIENT_CLINIC_OR_DEPARTMENT_OTHER): Payer: Self-pay | Admitting: Orthopedic Surgery

## 2017-02-06 DIAGNOSIS — M25461 Effusion, right knee: Secondary | ICD-10-CM | POA: Diagnosis not present

## 2017-02-07 DIAGNOSIS — M25561 Pain in right knee: Secondary | ICD-10-CM | POA: Diagnosis not present

## 2017-02-07 DIAGNOSIS — M25661 Stiffness of right knee, not elsewhere classified: Secondary | ICD-10-CM | POA: Diagnosis not present

## 2017-02-13 DIAGNOSIS — M25661 Stiffness of right knee, not elsewhere classified: Secondary | ICD-10-CM | POA: Diagnosis not present

## 2017-02-13 DIAGNOSIS — M25561 Pain in right knee: Secondary | ICD-10-CM | POA: Diagnosis not present

## 2017-02-19 DIAGNOSIS — M25661 Stiffness of right knee, not elsewhere classified: Secondary | ICD-10-CM | POA: Diagnosis not present

## 2017-02-19 DIAGNOSIS — M25561 Pain in right knee: Secondary | ICD-10-CM | POA: Diagnosis not present

## 2017-02-20 DIAGNOSIS — M25561 Pain in right knee: Secondary | ICD-10-CM | POA: Diagnosis not present

## 2017-03-08 DIAGNOSIS — M25561 Pain in right knee: Secondary | ICD-10-CM | POA: Diagnosis not present

## 2017-03-08 DIAGNOSIS — M25661 Stiffness of right knee, not elsewhere classified: Secondary | ICD-10-CM | POA: Diagnosis not present

## 2017-03-15 DIAGNOSIS — M25561 Pain in right knee: Secondary | ICD-10-CM | POA: Diagnosis not present

## 2017-03-15 DIAGNOSIS — M25661 Stiffness of right knee, not elsewhere classified: Secondary | ICD-10-CM | POA: Diagnosis not present

## 2017-03-26 DIAGNOSIS — E7800 Pure hypercholesterolemia, unspecified: Secondary | ICD-10-CM | POA: Diagnosis not present

## 2017-03-26 DIAGNOSIS — Z720 Tobacco use: Secondary | ICD-10-CM | POA: Diagnosis not present

## 2017-03-26 DIAGNOSIS — E119 Type 2 diabetes mellitus without complications: Secondary | ICD-10-CM | POA: Diagnosis not present

## 2017-04-03 DIAGNOSIS — M25661 Stiffness of right knee, not elsewhere classified: Secondary | ICD-10-CM | POA: Diagnosis not present

## 2017-04-03 DIAGNOSIS — M25461 Effusion, right knee: Secondary | ICD-10-CM | POA: Diagnosis not present

## 2017-04-03 DIAGNOSIS — M1711 Unilateral primary osteoarthritis, right knee: Secondary | ICD-10-CM | POA: Diagnosis not present

## 2017-04-03 DIAGNOSIS — M25561 Pain in right knee: Secondary | ICD-10-CM | POA: Diagnosis not present

## 2017-04-09 DIAGNOSIS — M25561 Pain in right knee: Secondary | ICD-10-CM | POA: Diagnosis not present

## 2017-04-09 DIAGNOSIS — M25661 Stiffness of right knee, not elsewhere classified: Secondary | ICD-10-CM | POA: Diagnosis not present

## 2017-04-11 DIAGNOSIS — M25561 Pain in right knee: Secondary | ICD-10-CM | POA: Diagnosis not present

## 2017-04-11 DIAGNOSIS — M25661 Stiffness of right knee, not elsewhere classified: Secondary | ICD-10-CM | POA: Diagnosis not present

## 2017-05-14 DIAGNOSIS — M25561 Pain in right knee: Secondary | ICD-10-CM | POA: Diagnosis not present

## 2017-05-14 DIAGNOSIS — M25661 Stiffness of right knee, not elsewhere classified: Secondary | ICD-10-CM | POA: Diagnosis not present

## 2017-05-15 DIAGNOSIS — M1711 Unilateral primary osteoarthritis, right knee: Secondary | ICD-10-CM | POA: Diagnosis not present

## 2017-06-19 DIAGNOSIS — M1711 Unilateral primary osteoarthritis, right knee: Secondary | ICD-10-CM | POA: Diagnosis not present

## 2017-06-19 DIAGNOSIS — M25461 Effusion, right knee: Secondary | ICD-10-CM | POA: Diagnosis not present

## 2017-07-17 DIAGNOSIS — M1711 Unilateral primary osteoarthritis, right knee: Secondary | ICD-10-CM | POA: Diagnosis not present

## 2017-08-24 ENCOUNTER — Other Ambulatory Visit: Payer: Self-pay | Admitting: Physician Assistant

## 2017-08-24 ENCOUNTER — Ambulatory Visit
Admission: RE | Admit: 2017-08-24 | Discharge: 2017-08-24 | Disposition: A | Discharge status: Home / Self Care | Payer: BC Managed Care – PPO | Source: Ambulatory Visit | Attending: Physician Assistant | Admitting: Physician Assistant

## 2017-09-17 DIAGNOSIS — M1711 Unilateral primary osteoarthritis, right knee: Secondary | ICD-10-CM | POA: Diagnosis not present

## 2017-09-24 DIAGNOSIS — M1711 Unilateral primary osteoarthritis, right knee: Secondary | ICD-10-CM | POA: Diagnosis not present

## 2017-12-25 ENCOUNTER — Other Ambulatory Visit: Payer: Self-pay | Admitting: Physician Assistant

## 2017-12-25 ENCOUNTER — Other Ambulatory Visit (HOSPITAL_COMMUNITY)
Admission: RE | Admit: 2017-12-25 | Discharge: 2017-12-25 | Disposition: A | Discharge status: Home / Self Care | Payer: BC Managed Care – PPO | Source: Ambulatory Visit | Attending: Family Medicine | Admitting: Family Medicine

## 2017-12-25 DIAGNOSIS — E7800 Pure hypercholesterolemia, unspecified: Secondary | ICD-10-CM | POA: Diagnosis not present

## 2017-12-25 DIAGNOSIS — Z01419 Encounter for gynecological examination (general) (routine) without abnormal findings: Secondary | ICD-10-CM | POA: Insufficient documentation

## 2017-12-25 DIAGNOSIS — Z124 Encounter for screening for malignant neoplasm of cervix: Secondary | ICD-10-CM | POA: Diagnosis not present

## 2017-12-25 DIAGNOSIS — Z0000 Encounter for general adult medical examination without abnormal findings: Secondary | ICD-10-CM | POA: Diagnosis not present

## 2017-12-25 DIAGNOSIS — E119 Type 2 diabetes mellitus without complications: Secondary | ICD-10-CM | POA: Diagnosis not present

## 2017-12-28 LAB — CYTOLOGY - PAP: Diagnosis: NEGATIVE

## 2018-01-21 DIAGNOSIS — M25561 Pain in right knee: Secondary | ICD-10-CM | POA: Diagnosis not present

## 2018-06-27 DIAGNOSIS — M25561 Pain in right knee: Secondary | ICD-10-CM | POA: Diagnosis not present

## 2018-06-27 DIAGNOSIS — Z96651 Presence of right artificial knee joint: Secondary | ICD-10-CM | POA: Diagnosis not present

## 2018-06-27 DIAGNOSIS — Z09 Encounter for follow-up examination after completed treatment for conditions other than malignant neoplasm: Secondary | ICD-10-CM | POA: Diagnosis not present

## 2018-12-31 DIAGNOSIS — M25561 Pain in right knee: Secondary | ICD-10-CM | POA: Diagnosis not present

## 2019-06-21 DIAGNOSIS — C801 Malignant (primary) neoplasm, unspecified: Secondary | ICD-10-CM

## 2019-06-21 HISTORY — DX: Malignant (primary) neoplasm, unspecified: C80.1

## 2019-07-01 DIAGNOSIS — Z96652 Presence of left artificial knee joint: Secondary | ICD-10-CM | POA: Diagnosis not present

## 2019-07-01 DIAGNOSIS — M25562 Pain in left knee: Secondary | ICD-10-CM | POA: Diagnosis not present

## 2019-07-02 ENCOUNTER — Other Ambulatory Visit: Payer: Self-pay | Admitting: Radiology

## 2019-07-03 ENCOUNTER — Encounter: Payer: Self-pay | Admitting: *Deleted

## 2019-07-04 ENCOUNTER — Other Ambulatory Visit: Payer: Self-pay | Admitting: *Deleted

## 2019-07-04 DIAGNOSIS — C50212 Malignant neoplasm of upper-inner quadrant of left female breast: Secondary | ICD-10-CM | POA: Insufficient documentation

## 2019-07-04 DIAGNOSIS — Z170 Estrogen receptor positive status [ER+]: Secondary | ICD-10-CM

## 2019-07-08 NOTE — Progress Notes (Signed)
[facility] NOTE  Patient Care Team: Lennie Odor, Hershal Coria as PCP - General (Nurse Practitioner) Rockwell Germany, RN as Oncology Nurse Navigator Mauro Kaufmann, RN as Oncology Nurse Navigator Alphonsa Overall, MD as Consulting Physician (General Surgery) Nicholas Lose, MD as Consulting Physician (Hematology and Oncology) Gery Pray, MD as Consulting Physician (Radiation Oncology)  CHIEF COMPLAINTS/PURPOSE OF CONSULTATION:  Newly diagnosed breast cancer  HISTORY OF PRESENTING ILLNESS:  Kathryn Webb 58 y.o. female is here because of recent diagnosis of invasive ductal carcinoma of the left breast. The cancer was detected on a routine screening mammogram on 06/11/19. Diagnostic mammogram on 06/20/19 showed an irregular, indeterminate architectural distortion in the left breast. US showed the mass to be 1.0cm at the 9 o'clock position. Biopsy on 07/02/19 showed invasive ductal carcinoma, grade 2, HER-2 negative (1+), ER 100%, PR 100%, Ki67 15%. She presents to the clinic today for initial evaluation and discussion of treatment options.   I reviewed her records extensively and collaborated the history with the patient.  SUMMARY OF ONCOLOGIC HISTORY: Oncology History  Malignant neoplasm of upper-inner quadrant of left breast in female, estrogen receptor positive ([place])  07/04/2019 Initial Diagnosis   Routine screening detected an irregular, indeterminate architectural distortion in the left breast. US showed a 1.0cm mass at the 9 o'clock position. Biopsy showed IDC, grade 2, HER-2 - (1+), ER+ 100%, PR+ 100%, Ki67 15%.    07/09/2019 Cancer Staging   Staging form: Breast, AJCC 8th Edition - Clinical stage from 07/09/2019: Stage IA (cT1c, cN0, cM0, G2, ER+, PR+, HER2-) - Signed by Nicholas Lose, MD on 07/09/2019     MEDICAL HISTORY:  Past Medical History:  Diagnosis Date  . Acute medial meniscus tear   . Arthritis   . Diabetes mellitus without complication ([place])   .  Hidradenitis suppurativa   . Serum lipids high     SURGICAL HISTORY: Past Surgical History:  Procedure Laterality Date  . AXILLARY HIDRADENITIS EXCISION    . CHONDROPLASTY Right 01/24/2017   Procedure: CHONDROPLASTY PATELLA  FEMORAL JOINT;  Surgeon: Dorna Leitz, MD;  Location: [facility];  Service: Orthopedics;  Laterality: Right;  . KNEE ARTHROSCOPY WITH LATERAL MENISECTOMY Right 01/24/2017   Procedure: KNEE ARTHROSCOPY WITH LATERAL MENISECTOMY/ PARTIAL;  Surgeon: Dorna Leitz, MD;  Location: [facility];  Service: Orthopedics;  Laterality: Right;  . KNEE ARTHROSCOPY WITH MEDIAL MENISECTOMY Right 01/24/2017   Procedure: ARTHROSCOPY KNEE, PARTIAL MEDIAL  MENISCECTOMY,;  Surgeon: Dorna Leitz, MD;  Location: [facility];  Service: Orthopedics;  Laterality: Right;  . PERINEAL HIDRADENITIS EXCISION      SOCIAL HISTORY: Social History   Socioeconomic History  . Marital status: Single    Spouse name: Not on file  . Number of children: Not on file  . Years of education: 50  . Highest education level: Not on file  Occupational History  . 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 Financial resource strain: Not on file  . Food insecurity    Worry: Not on file    Inability: Not on file  . Transportation needs    Medical: Not on file    Non-medical: Not on file  Tobacco Use  . Smoking status: Current Every Day Smoker    Packs/day: 0.25  . Smokeless tobacco: Never Used  Substance and Sexual Activity  . Alcohol use: Yes    Comment: social  . Drug use:  No  . Sexual activity: Not on file  Lifestyle  . Physical activity    Days per week: Not on file    Minutes per session: Not on file  . Stress: Not on file  Relationships  . Social Herbalist on phone: Not on file    Gets together: Not on file    Attends religious service: Not on file    Active member of club or organization: Not on file     Attends meetings of clubs or organizations: Not on file    Relationship status: Not on file  . Intimate partner violence    Fear of current or ex partner: Not on file    Emotionally abused: Not on file    Physically abused: Not on file    Forced sexual activity: Not on file  Other Topics Concern  . Not on file  Social History Narrative   Regular exercise-yes   Caffiene Use-yes    FAMILY HISTORY: Family History  Problem Relation Age of Onset  . Hypertension Father   . Kidney disease Father   . Diabetes Father   . Heart disease Mother   . Hypertension Mother   . Diabetes Mother   . Diabetes Brother   . Cancer Paternal Uncle   . Stroke Neg Hx     ALLERGIES:  has No Known Allergies.  MEDICATIONS:  Current Outpatient Medications  Medication Sig Dispense Refill  . atorvastatin (LIPITOR) 20 MG tablet Take 20 mg by mouth daily.    . Blood Glucose Monitoring Suppl (CONTOUR NEXT EZ MONITOR) W/DEVICE KIT 1 Act by Does not apply route 2 (two) times daily. 2 kit 0  . empagliflozin (JARDIANCE) 10 MG TABS tablet Take 10 mg by mouth daily.    . ergocalciferol (VITAMIN D2) 1.25 MG (50000 UT) capsule Take 50,000 Units by mouth once a week.    Marland Kitchen glucose blood (BAYER CONTOUR NEXT TEST) test strip Use BID 100 each 12  . losartan (COZAAR) 25 MG tablet Take 25 mg by mouth daily.    . metFORMIN (GLUCOPHAGE) 500 MG tablet TAKE 1 TABLET (500 MG TOTAL) BY MOUTH 2 (TWO) TIMES DAILY WITH A MEAL. (Patient taking differently: 1,000 mg 2 (two) times daily with a meal. ) 60 tablet 2  . Varenicline Tartrate (CHANTIX PO) Take by mouth 2 (two) times daily.     No current facility-administered medications for this visit.     REVIEW OF SYSTEMS:   Constitutional: Denies fevers, chills or abnormal night sweats Eyes: Denies blurriness of vision, double vision or watery eyes Ears, nose, mouth, throat, and face: Denies mucositis or sore throat Respiratory: Denies cough, dyspnea or wheezes Cardiovascular:  Denies palpitation, chest discomfort or lower extremity swelling Gastrointestinal:  Denies nausea, heartburn or change in bowel habits Skin: Denies abnormal skin rashes Lymphatics: Denies new lymphadenopathy or easy bruising Neurological:Denies numbness, tingling or new weaknesses Behavioral/Psych: Mood is stable, no new changes  Breast:  Denies any palpable lumps or discharge All other systems were reviewed with the patient and are negative.  PHYSICAL EXAMINATION: ECOG PERFORMANCE STATUS: 0 - Asymptomatic  Vitals:   07/09/19 0850  BP: 124/60  Pulse: 63  Temp: 98 F (36.7 C)  SpO2: 100%   Filed Weights   07/09/19 0850  Weight: 221 lb 6.4 oz (100.4 kg)    GENERAL:alert, no distress and comfortable SKIN: skin color, texture, turgor are normal, no rashes or significant lesions EYES: normal, conjunctiva are pink and non-injected, sclera  clear OROPHARYNX:no exudate, no erythema and lips, buccal mucosa, and tongue normal  NECK: supple, thyroid normal size, non-tender, without nodularity LYMPH:  no palpable lymphadenopathy in the cervical, axillary or inguinal LUNGS: clear to auscultation and percussion with normal breathing effort HEART: regular rate & rhythm and no murmurs and no lower extremity edema ABDOMEN:abdomen soft, non-tender and normal bowel sounds Musculoskeletal:no cyanosis of digits and no clubbing  PSYCH: alert & oriented x 3 with fluent speech NEURO: no focal motor/sensory deficits BREAST: No palpable nodules in breast. No palpable axillary or supraclavicular lymphadenopathy (exam performed in the presence of a chaperone)   LABORATORY DATA:  I have reviewed the data as listed Lab Results  Component Value Date   WBC 5.4 07/09/2019   HGB 12.6 07/09/2019   HCT 39.5 07/09/2019   MCV 84.4 07/09/2019   PLT 238 07/09/2019   Lab Results  Component Value Date   NA 139 07/09/2019   K 4.3 07/09/2019   CL 103 07/09/2019   CO2 26 07/09/2019    RADIOGRAPHIC  STUDIES: I have personally reviewed the radiological reports and agreed with the findings in the report.  ASSESSMENT AND PLAN:  Malignant neoplasm of upper-inner quadrant of left breast in female, estrogen receptor positive ([place]) 07/04/2019:Routine screening detected an irregular, indeterminate architectural distortion in the left breast. US showed a 1.0cm mass at the 9 o'clock position. Biopsy showed IDC, grade 2, HER-2 - (1+), ER+ 100%, PR+ 100%, Ki67 15%.  T1c M0 stage Ia clinical stage  Pathology and radiology counseling:Discussed with the patient, the details of pathology including the type of breast cancer,the clinical staging, the significance of ER, PR and HER-2/neu receptors and the implications for treatment. After reviewing the pathology in detail, we proceeded to discuss the different treatment options between surgery, radiation, chemotherapy, antiestrogen therapies.  Recommendations: 1. Breast conserving surgery followed by 2. Oncotype DX testing to determine if chemotherapy would be of any benefit followed by 3. Adjuvant radiation therapy followed by 4. Adjuvant antiestrogen therapy  Oncotype counseling: I discussed Oncotype DX test. I explained to the patient that this is a 21 gene panel to evaluate patient tumors DNA to calculate recurrence score. This would help determine whether patient has high risk or intermediate risk or low risk breast cancer. She understands that if her tumor was found to be high risk, she would benefit from systemic chemotherapy. If low risk, no need of chemotherapy. If she was found to be intermediate risk, we would need to evaluate the score as well as other risk factors and determine if an abbreviated chemotherapy may be of benefit.  Patient works at [employer].  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 Return to clinic after surgery to discuss final pathology report and then determine if Oncotype DX testing will need to be  sent.  All questions were answered. The patient knows to call the clinic with any problems, questions or concerns.   Rulon Eisenmenger, MD 07/09/2019    I, Molly Dorshimer, am acting as scribe for Nicholas Lose, MD.  I have reviewed the above documentation for accuracy and completeness, and I agree with the above.

## 2019-07-09 ENCOUNTER — Other Ambulatory Visit: Payer: Self-pay

## 2019-07-09 ENCOUNTER — Encounter: Payer: Self-pay | Admitting: Hematology and Oncology

## 2019-07-09 ENCOUNTER — Inpatient Hospital Stay: Payer: BC Managed Care – PPO | Attending: Hematology and Oncology | Admitting: Hematology and Oncology

## 2019-07-09 ENCOUNTER — Ambulatory Visit
Admission: RE | Admit: 2019-07-09 | Discharge: 2019-07-09 | Disposition: A | Discharge status: Home / Self Care | Payer: BC Managed Care – PPO | Source: Ambulatory Visit | Attending: Radiation Oncology | Admitting: Radiation Oncology

## 2019-07-09 ENCOUNTER — Other Ambulatory Visit (HOSPITAL_COMMUNITY): Payer: Self-pay | Admitting: Surgery

## 2019-07-09 ENCOUNTER — Encounter: Payer: Self-pay | Admitting: Physical Therapy

## 2019-07-09 ENCOUNTER — Inpatient Hospital Stay: Payer: BC Managed Care – PPO

## 2019-07-09 ENCOUNTER — Ambulatory Visit: Payer: BC Managed Care – PPO | Attending: Surgery | Admitting: Physical Therapy

## 2019-07-09 DIAGNOSIS — Z923 Personal history of irradiation: Secondary | ICD-10-CM | POA: Diagnosis not present

## 2019-07-09 DIAGNOSIS — M25511 Pain in right shoulder: Secondary | ICD-10-CM | POA: Insufficient documentation

## 2019-07-09 DIAGNOSIS — C50212 Malignant neoplasm of upper-inner quadrant of left female breast: Secondary | ICD-10-CM

## 2019-07-09 DIAGNOSIS — Z7984 Long term (current) use of oral hypoglycemic drugs: Secondary | ICD-10-CM | POA: Insufficient documentation

## 2019-07-09 DIAGNOSIS — C50912 Malignant neoplasm of unspecified site of left female breast: Secondary | ICD-10-CM

## 2019-07-09 DIAGNOSIS — E119 Type 2 diabetes mellitus without complications: Secondary | ICD-10-CM | POA: Insufficient documentation

## 2019-07-09 DIAGNOSIS — Z79899 Other long term (current) drug therapy: Secondary | ICD-10-CM | POA: Insufficient documentation

## 2019-07-09 DIAGNOSIS — G8929 Other chronic pain: Secondary | ICD-10-CM | POA: Diagnosis not present

## 2019-07-09 DIAGNOSIS — Z170 Estrogen receptor positive status [ER+]: Secondary | ICD-10-CM | POA: Insufficient documentation

## 2019-07-09 DIAGNOSIS — F17210 Nicotine dependence, cigarettes, uncomplicated: Secondary | ICD-10-CM | POA: Insufficient documentation

## 2019-07-09 DIAGNOSIS — R293 Abnormal posture: Secondary | ICD-10-CM | POA: Diagnosis not present

## 2019-07-09 DIAGNOSIS — C50312 Malignant neoplasm of lower-inner quadrant of left female breast: Secondary | ICD-10-CM | POA: Diagnosis not present

## 2019-07-09 DIAGNOSIS — Z79811 Long term (current) use of aromatase inhibitors: Secondary | ICD-10-CM | POA: Diagnosis not present

## 2019-07-09 DIAGNOSIS — F17200 Nicotine dependence, unspecified, uncomplicated: Secondary | ICD-10-CM | POA: Diagnosis not present

## 2019-07-09 DIAGNOSIS — M129 Arthropathy, unspecified: Secondary | ICD-10-CM | POA: Diagnosis not present

## 2019-07-09 LAB — CMP (CANCER CENTER ONLY)
ALT: 18 U/L (ref 0–44)
AST: 17 U/L (ref 15–41)
Albumin: 4.2 g/dL (ref 3.5–5.0)
Alkaline Phosphatase: 118 U/L (ref 38–126)
Anion gap: 10 (ref 5–15)
BUN: 13 mg/dL (ref 6–20)
CO2: 26 mmol/L (ref 22–32)
Calcium: 11.4 mg/dL — ABNORMAL HIGH (ref 8.9–10.3)
Chloride: 103 mmol/L (ref 98–111)
Creatinine: 0.75 mg/dL (ref 0.44–1.00)
GFR, Est AFR Am: >60 mL/min (ref >60)
GFR, Estimated: >60 mL/min (ref >60)
Glucose, Bld: 114 mg/dL — ABNORMAL HIGH (ref 70–99)
Potassium: 4.3 mmol/L (ref 3.5–5.1)
Sodium: 139 mmol/L (ref 135–145)
Total Bilirubin: 0.4 mg/dL (ref 0.3–1.2)
Total Protein: 8.1 g/dL (ref 6.5–8.1)

## 2019-07-09 LAB — CBC WITH DIFFERENTIAL (CANCER CENTER ONLY)
Abs Immature Granulocytes: 0.01 10*3/uL (ref 0.00–0.07)
Basophils Absolute: 0 10*3/uL (ref 0.0–0.1)
Basophils Relative: 1 %
Eosinophils Absolute: 0.1 10*3/uL (ref 0.0–0.5)
Eosinophils Relative: 1 %
HCT: 39.5 % (ref 36.0–46.0)
Hemoglobin: 12.6 g/dL (ref 12.0–15.0)
Immature Granulocytes: 0 %
Lymphocytes Relative: 48 %
Lymphs Abs: 2.6 10*3/uL (ref 0.7–4.0)
MCH: 26.9 pg (ref 26.0–34.0)
MCHC: 31.9 g/dL (ref 30.0–36.0)
MCV: 84.4 fL (ref 80.0–100.0)
Monocytes Absolute: 0.3 10*3/uL (ref 0.1–1.0)
Monocytes Relative: 6 %
Neutro Abs: 2.4 10*3/uL (ref 1.7–7.7)
Neutrophils Relative %: 44 %
Platelet Count: 238 10*3/uL (ref 150–400)
RBC: 4.68 MIL/uL (ref 3.87–5.11)
RDW: 14.1 % (ref 11.5–15.5)
WBC Count: 5.4 10*3/uL (ref 4.0–10.5)
nRBC: 0 % (ref 0.0–0.2)

## 2019-07-09 NOTE — Assessment & Plan Note (Signed)
07/04/2019:Routine screening detected an irregular, indeterminate architectural distortion in the left breast. US showed a 1.0cm mass at the 9 o'clock position. Biopsy showed IDC, grade 2, HER-2 - (1+), ER+ 100%, PR+ 100%, Ki67 15%.  T1c M0 stage Ia clinical stage  Pathology and radiology counseling:Discussed with the patient, the details of pathology including the type of breast cancer,the clinical staging, the significance of ER, PR and HER-2/neu receptors and the implications for treatment. After reviewing the pathology in detail, we proceeded to discuss the different treatment options between surgery, radiation, chemotherapy, antiestrogen therapies.  Recommendations: 1. Breast conserving surgery followed by 2. Oncotype DX testing to determine if chemotherapy would be of any benefit followed by 3. Adjuvant radiation therapy followed by 4. Adjuvant antiestrogen therapy  Oncotype counseling: I discussed Oncotype DX test. I explained to the patient that this is a 21 gene panel to evaluate patient tumors DNA to calculate recurrence score. This would help determine whether patient has high risk or intermediate risk or low risk breast cancer. She understands that if her tumor was found to be high risk, she would benefit from systemic chemotherapy. If low risk, no need of chemotherapy. If she was found to be intermediate risk, we would need to evaluate the score as well as other risk factors and determine if an abbreviated chemotherapy may be of benefit.  Return to clinic after surgery to discuss final pathology report and then determine if Oncotype DX testing will need to be sent.

## 2019-07-09 NOTE — Patient Instructions (Signed)

## 2019-07-09 NOTE — Therapy (Addendum)
[place], [place], [address] Phone: [phone]   Fax:  [phone]  Physical Therapy Evaluation  Patient Details  Name: Kathryn Webb MRN: [id number] Date of Birth: 1960-11-30 Referring Provider (PT): [name]   Encounter Date: 07/09/2019  PT End of Session - 07/09/19 1210    Visit Number  1    Number of Visits  9   2x/week for 4 weeks followed by 1 post op visit for reassessment   Date for PT Re-Evaluation  09/03/19    PT Start Time  1058    PT Stop Time  1140    PT Time Calculation (min)  42 min    Activity Tolerance  Patient tolerated treatment well    Behavior During Therapy  [facility] for tasks assessed/performed       Past Medical History:  Diagnosis Date  . Acute medial meniscus tear   . Arthritis   . Diabetes mellitus without complication ([place])   . Hidradenitis suppurativa   . Serum lipids high     Past Surgical History:  Procedure Laterality Date  . AXILLARY HIDRADENITIS EXCISION    . CHONDROPLASTY Right 01/24/2017   Procedure: CHONDROPLASTY PATELLA  FEMORAL JOINT;  Surgeon: Dorna Leitz, MD;  Location: [place];  Service: Orthopedics;  Laterality: Right;  . KNEE ARTHROSCOPY WITH LATERAL MENISECTOMY Right 01/24/2017   Procedure: KNEE ARTHROSCOPY WITH LATERAL MENISECTOMY/ PARTIAL;  Surgeon: Dorna Leitz, MD;  Location: [place];  Service: Orthopedics;  Laterality: Right;  . KNEE ARTHROSCOPY WITH MEDIAL MENISECTOMY Right 01/24/2017   Procedure: ARTHROSCOPY KNEE, PARTIAL MEDIAL  MENISCECTOMY,;  Surgeon: Dorna Leitz, MD;  Location: [place];  Service: Orthopedics;  Laterality: Right;  . PERINEAL HIDRADENITIS EXCISION      There were no vitals filed for this visit.   Subjective Assessment - 07/09/19 1150    Subjective  Patient reports she is here today to be seen by her medical team for her newly diagnosed left breast cancer. She also  has complaints of right shoulder pain present x 6 months. It began for unknown reasons, she saw a PA-C earlier this year and was prescribed NSAIDS but no other treatment was offered. No x-rays were taken.    Pertinent History  Patient was diagnosed on 06/11/2019 with left grade II invasive ductal carcinoma breast cancer. It measures 1.1 cm and is located in the lower inner quadrant. It is ER/PR positive and HER2 negative with a Ki67 of 15%. She had a right total knee replacement in 2018.    Patient Stated Goals  Decrease right shoulder pain, learn post op shoulder ROM HEP and lymphedema risk reduction practices.    Currently in Pain?  Yes    Pain Score  4     Pain Location  Shoulder    Pain Orientation  Right    Pain Descriptors / Indicators  Aching    Pain Type  Chronic pain    Pain Onset  More than a month ago   6 months ago ~ 12/2018   Pain Frequency  Intermittent    Aggravating Factors   Donning and doffing sports bra, hammering, typing    Pain Relieving Factors  Rest    Multiple Pain Sites  No         [facility] PT Assessment - 07/09/19 0001      Assessment   Medical Diagnosis  Left breast cancer    Referring Provider (PT)  [name]    Onset Date/Surgical Date  06/11/19    Hand Dominance  Right    Prior Therapy  none      Precautions   Precautions  Other (comment)    Precaution Comments  active cancer      Restrictions   Weight Bearing Restrictions  No      Balance Screen   Has the patient fallen in the past 6 months  No    Has the patient had a decrease in activity level because of a fear of falling?   No    Is the patient reluctant to leave their home because of a fear of falling?   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    Leisure  She walks daily > 1 hour for exercise      Cognition   Overall Cognitive Status  Within Functional Limits for tasks assessed      Posture/Postural Control   Posture/Postural Control  Postural limitations    Postural Limitations  Rounded Shoulders;Forward head      ROM / Strength   AROM / PROM / Strength  AROM;Strength      AROM   Overall AROM Comments  Right shoulder AROM with compensatory movements and pain    AROM Assessment Site  Shoulder;Cervical    Right/Left Shoulder  Right;Left    Right Shoulder Extension  40 Degrees    Right Shoulder Flexion  131 Degrees    Right Shoulder ABduction  145 Degrees    Right Shoulder Internal Rotation  80 Degrees    Right Shoulder External Rotation  77 Degrees    Left Shoulder Extension  55 Degrees    Left Shoulder Flexion  137 Degrees    Left Shoulder ABduction  146 Degrees    Left Shoulder Internal Rotation  80 Degrees    Left Shoulder External Rotation  90 Degrees    Cervical Flexion  WNL    Cervical Extension  WNL    Cervical - Right Side Bend  WNL    Cervical - Left Side Bend  WNL    Cervical - Right Rotation  WNL    Cervical - Left Rotation  WNL      Strength   Overall Strength Comments  Left shoulder 5/5    Strength Assessment Site  Shoulder;Elbow    Right/Left Shoulder  Right    Right Shoulder Flexion  3+/5    Right Shoulder Extension  5/5    Right Shoulder ABduction  3+/5    Right Shoulder Internal Rotation  5/5    Right Shoulder External Rotation  5/5    Right/Left Elbow  Right    Right Elbow Flexion  4-/5    Right Elbow Extension  4-/5      Palpation   Patella mobility  Pain (significant) with palpation to right anterior shoulder at biceps insertion site      Special Tests    Special Tests  Rotator Cuff Impingement    Rotator Cuff Impingment tests  Empty Can test      Empty Can test   Findings  Positive    Side  Right    Comment  Pain and weakness present         LYMPHEDEMA/ONCOLOGY QUESTIONNAIRE - 07/09/19 1208      Type   Cancer Type  left breast cancer      Lymphedema Assessments   Lymphedema Assessments  Upper extremities      Right Upper Extremity Lymphedema   10 cm Proximal to Olecranon Process  30.3 cm    Olecranon Process  25.7 cm    10 cm Proximal to Ulnar Styloid Process  22 cm    Just Proximal to Ulnar Styloid Process  16.6 cm    Across Hand at [employer]  19.9 cm    At [place] of 2nd Digit  7.1 cm      Left Upper Extremity Lymphedema   10 cm Proximal to Olecranon Process  30.8 cm    Olecranon Process  25.9 cm    10 cm Proximal to Ulnar Styloid Process  21.2 cm    Just Proximal to Ulnar Styloid Process  16.2 cm    Across Hand at [employer]  19.4 cm    At [place] of 2nd Digit  6.3 cm          Quick Dash - 07/09/19 0001    Open a tight or new jar  Mild difficulty    Do heavy household chores (wash walls, wash floors)  No difficulty    Carry a shopping bag or briefcase  No difficulty    Wash your back  No difficulty    Use a knife to cut food  No difficulty    Recreational activities in which you take some force or impact through your arm, shoulder, or hand (golf, hammering, tennis)  Mild difficulty    During the past week, to what extent has your arm, shoulder or hand problem interfered with your normal social activities with family, friends, neighbors, or groups?  Not at all    During the past week, to what extent has your arm, shoulder or hand problem limited your work or other regular daily activities  Not at all    Arm, shoulder, or hand pain.  Moderate    Tingling (pins and needles) in your arm, shoulder, or hand  None    Difficulty Sleeping  Mild difficulty    DASH Score  11.36 %        Objective measurements completed on examination: See above findings.       Patient was instructed today in a home exercise program today for post op shoulder range of motion. These included active assist shoulder  flexion in sitting, scapular retraction, wall walking with shoulder abduction, and hands behind head external rotation.  She was encouraged to do these twice a day, holding 3 seconds and repeating 5 times when permitted by her physician.           PT Education - 07/09/19 1209    Education Details  Lymphedema risk reduction and post op shoulder ROM HEP; explained need for PT for right shoulder pain and possible treatment that will be done in PT    Person(s) Educated  Patient    Methods  Explanation;Demonstration;Handout    Comprehension  Returned demonstration;Verbalized understanding       PT Short Term Goals - 07/09/19 1216      PT SHORT TERM GOAL #1   Title  Patient will be independent with a HEP for right shoulder ROM and posture.    Time  4  Period  Weeks    Status  New    Target Date  08/06/19      PT SHORT TERM GOAL #2   Title  Patient will improve DASH score to </= 5 for improved overall shoudler function on the right side.    Baseline  11.36    Time  4    Period  Weeks    Status  New    Target Date  08/06/19      PT SHORT TERM GOAL #3   Title  Patient will report >/= 40% less right shoulder pain with daily tasks including household tasks and work tasks.    Time  4    Period  Weeks    Status  New    Target Date  08/06/19      PT SHORT TERM GOAL #4   Title  Patient will report >/= 25% less difficulty donning and doffing her sports bra.    Time  4    Period  Weeks    Status  New    Target Date  08/06/19      PT SHORT TERM GOAL #5   Title  Patient will increase right shoulder strength to >/= 4+/5 for improved ability to perform daily tasks.    Time  4    Period  Weeks    Status  New        PT Long Term Goals - 07/09/19 1215      PT LONG TERM GOAL #1   Title  Patient will demonstrate she has regained full left shoulder ROM and function post operatively compared to baselines.    Time  8    Period  Weeks    Status  New    Target Date  09/03/19       Breast Clinic Goals - 07/09/19 1215      Patient will be able to verbalize understanding of pertinent lymphedema risk reduction practices relevant to her diagnosis specifically related to skin care.   Time  1    Period  Days    Status  Achieved      Patient will be able to return demonstrate and/or verbalize understanding of the post-op home exercise program related to regaining shoulder range of motion.   Time  1    Period  Days    Status  Achieved      Patient will be able to verbalize understanding of the importance of attending the postoperative After Breast Cancer Class for further lymphedema risk reduction education and therapeutic exercise.   Time  1    Period  Days    Status  Achieved            Plan - 07/09/19 1211    Clinical Impression Statement  Patient was diagnosed on 06/11/2019 with left grade II invasive ductal carcinoma breast cancer. It measures 1.1 cm and is located in the lower inner quadrant. It is ER/PR positive and HER2 negative with a Ki67 of 15%. She had a right total knee replacement in 2018. Her multidisciplinary medical team met prior to her assessments to determine a recommended treatment plan. She is planning to have a left lumpectomy and sentinel node biopsy followed by Oncotype testing, radiation, and anti-estrogen therapy. She will benefit from a post op PT reassessment to determine needs. She will also benefit from PT currently to try to improve right shoulder pain and function prior to surgery.    Stability/Clinical Decision Making  Stable/Uncomplicated  Clinical Decision Making  Low    Rehab Potential  Excellent    PT Frequency  2x / week    PT Duration  PT interventions  4 weeks   And 1 additional visit post operatively ADL/self-care, home management; therapeutic exercises; manual therapy; ice/heat; Neuromuscular re-education, taping, passive range of motion   PT Next Visit Plan  Begin ionto right shoulder; cross friction massage, gentle  joint mobs and/or PROM; begin postural exercises for HEP - scap ret, doorway stretch, ER stretch, backward shoulder rolls, etc    PT Home Exercise Plan  Post op shoulder ROM HEP given today    Consulted and Agree with Plan of Care  Patient       Patient will benefit from skilled therapeutic intervention in order to improve the following deficits and impairments:  Decreased knowledge of precautions, Pain, Impaired UE functional use, Decreased strength, Postural dysfunction, Decreased range of motion  Visit Diagnosis: 1. Malignant neoplasm of lower-inner quadrant of left breast in female, estrogen receptor positive ([place])   2. Abnormal posture   3. Chronic right shoulder pain      Patient will follow up at outpatient cancer rehab 3-4 weeks following surgery.  If the patient requires physical therapy at that time, a specific plan will be dictated and sent to the referring physician for approval. The patient was educated today on appropriate basic range of motion exercises to begin post operatively and the importance of attending the After Breast Cancer class following surgery.  Patient was educated today on lymphedema risk reduction practices as it pertains to recommendations that will benefit the patient immediately following surgery.  She verbalized good understanding.      Problem List Patient Active Problem List   Diagnosis Date Noted  . Malignant neoplasm of upper-inner quadrant of left breast in female, estrogen receptor positive ([place]) 07/04/2019  . Acute medial meniscal tear, right, initial encounter 01/24/2017  . Chondromalacia patellae, right knee 01/24/2017  . Acute lateral meniscal tear, right, initial encounter 01/24/2017  . Hyperlipidemia LDL goal < 100 03/11/2014  . Dysuria 03/11/2014  . Tobacco abuse 03/11/2014  . Routine general medical examination at a health care facility 03/11/2014  . Hypercalcemia 03/11/2014  . Need for pneumococcal vaccination 03/11/2014  . Other and  unspecified hyperlipidemia 03/14/2013  . Diabetes mellitus type 2 in obese [facility]) 03/14/2013   Annia Friendly, PT 07/09/19 12:21 PM  [place], [place], [address] Phone: [phone]   Fax:  [phone]  Name: Kathryn Webb MRN: [id number] Date of Birth: 1961-02-05

## 2019-07-09 NOTE — Progress Notes (Signed)
Radiation Oncology         [phone]) [phone] ________________________________  Multidisciplinary Breast Oncology Clinic [facility]) Initial Outpatient Consultation  Name: Kathryn Webb MRN: [id number]  Date: 07/09/2019  DOB: 04/25/1961  XH:[id number], [place], PA-C  [place], [place], PA-C   REFERRING PHYSICIAN: Lennie Odor, PA-C  DIAGNOSIS: The encounter diagnosis was Malignant neoplasm of upper-inner quadrant of left breast in female, estrogen receptor positive ([place]).  Stage IA (cT1c, cN0) Left Breast UIQ, Invasive Ductal Carcinoma, ER+ / PR+ / Her2-, Grade 2    ICD-10-CM   1. Malignant neoplasm of upper-inner quadrant of left breast in female, estrogen receptor positive ([place])  C50.212    Z17.0     HISTORY OF PRESENT ILLNESS::Kathryn Webb is a 58 y.o. female who is presenting to the office today for evaluation of her newly diagnosed breast cancer. She is accompanied by no one with Covid-19 restrictions.  Patient's sister and mother are present by phone contact. She is doing well overall.   She had routine screening mammography on 06/11/2019 showing a possible abnormality in the left breast. She underwent left diagnostic mammography with tomography and left breast ultrasonography at [facility] on 06/20/2019 showing: new 1 cm irregular mass at 9 o'clock.  Biopsy on 07/02/2019 showed: invasive ductal carcinoma, grade 2. Prognostic indicators significant for: estrogen receptor, 100% positive and progesterone receptor, 100% positive, both with strong staining intensity. Proliferation marker Ki67 at 15%. HER2 negative.  Menarche: 58 years old Age at first live birth: n/a GP: 0 LMP: 2012 Contraceptive: no HRT: no   The patient was referred today for presentation in the multidisciplinary conference.  Radiology studies and pathology slides were presented there for review and discussion of treatment options.  A consensus was discussed regarding potential next steps.  PREVIOUS RADIATION THERAPY: No  PAST  MEDICAL HISTORY:  Past Medical History:  Diagnosis Date  . Acute medial meniscus tear   . Arthritis   . Diabetes mellitus without complication ([place])   . Hidradenitis suppurativa   . Serum lipids high     PAST SURGICAL HISTORY: Past Surgical History:  Procedure Laterality Date  . AXILLARY HIDRADENITIS EXCISION    . CHONDROPLASTY Right 01/24/2017   Procedure: CHONDROPLASTY PATELLA  FEMORAL JOINT;  Surgeon: Dorna Leitz, MD;  Location: [place];  Service: Orthopedics;  Laterality: Right;  . KNEE ARTHROSCOPY WITH LATERAL MENISECTOMY Right 01/24/2017   Procedure: KNEE ARTHROSCOPY WITH LATERAL MENISECTOMY/ PARTIAL;  Surgeon: Dorna Leitz, MD;  Location: [place];  Service: Orthopedics;  Laterality: Right;  . KNEE ARTHROSCOPY WITH MEDIAL MENISECTOMY Right 01/24/2017   Procedure: ARTHROSCOPY KNEE, PARTIAL MEDIAL  MENISCECTOMY,;  Surgeon: Dorna Leitz, MD;  Location: [place];  Service: Orthopedics;  Laterality: Right;  . PERINEAL HIDRADENITIS EXCISION      FAMILY HISTORY:  Family History  Problem Relation Age of Onset  . Hypertension Father   . Kidney disease Father   . Diabetes Father   . Heart disease Mother   . Hypertension Mother   . Diabetes Mother   . Diabetes Brother   . Cancer Paternal Uncle   . Stroke Neg Hx     SOCIAL HISTORY:  Social History   Socioeconomic History  . Marital status: Single    Spouse name: Not on file  . Number of children: Not on file  . Years of education: 35  . Highest education level: Not on file  Occupational History  . Occupation: Facilities manager:  [employer]   Social Needs  . Financial resource strain: Not on file  . Food insecurity    Worry: Not on file    Inability: Not on file  . Transportation needs    Medical: Not on file    Non-medical: Not on file  Tobacco Use  . Smoking status: Current Every Day Smoker    Packs/day: 0.25  . Smokeless tobacco: Never Used   Substance and Sexual Activity  . Alcohol use: Yes    Comment: social  . Drug use: No  . Sexual activity: Not on file  Lifestyle  . Physical activity    Days per week: Not on file    Minutes per session: Not on file  . Stress: Not on file  Relationships  . Social Herbalist on phone: Not on file    Gets together: Not on file    Attends religious service: Not on file    Active member of club or organization: Not on file    Attends meetings of clubs or organizations: Not on file    Relationship status: Not on file  Other Topics Concern  . Not on file  Social History Narrative   Regular exercise-yes   Caffiene Use-yes    ALLERGIES: No Known Allergies  MEDICATIONS:  Current Outpatient Medications  Medication Sig Dispense Refill  . atorvastatin (LIPITOR) 20 MG tablet Take 20 mg by mouth daily.    . Blood Glucose Monitoring Suppl (CONTOUR NEXT EZ MONITOR) W/DEVICE KIT 1 Act by Does not apply route 2 (two) times daily. 2 kit 0  . empagliflozin (JARDIANCE) 10 MG TABS tablet Take 10 mg by mouth daily.    . ergocalciferol (VITAMIN D2) 1.25 MG (50000 UT) capsule Take 50,000 Units by mouth once a week.    Marland Kitchen glucose blood (BAYER CONTOUR NEXT TEST) test strip Use BID 100 each 12  . losartan (COZAAR) 25 MG tablet Take 25 mg by mouth daily.    . metFORMIN (GLUCOPHAGE) 500 MG tablet TAKE 1 TABLET (500 MG TOTAL) BY MOUTH 2 (TWO) TIMES DAILY WITH A MEAL. (Patient taking differently: 1,000 mg 2 (two) times daily with a meal. ) 60 tablet 2  . Varenicline Tartrate (CHANTIX PO) Take by mouth 2 (two) times daily.     No current facility-administered medications for this encounter.     REVIEW OF SYSTEMS: A 10+ POINT REVIEW OF SYSTEMS WAS OBTAINED including neurology, dermatology, psychiatry, cardiac, respiratory, lymph, extremities, GI, GU, musculoskeletal, constitutional, reproductive, HEENT. On the provided form, she reports chronic right knee pain . She denies nipple charge or bleeding  and any other symptoms.    PHYSICAL EXAM:  Vitals with BMI 07/09/2019  Height _0   Weight 221 lbs 6 oz  BMI 32.54  Systolic 982  Diastolic 60  Pulse 63  Respirations   Lungs are clear to auscultation bilaterally. Heart has regular rate and rhythm. No palpable cervical, supraclavicular, or axillary adenopathy. Abdomen soft, non-tender, normal bowel sounds. Right Breast:  breast with no palpable mass, nipple discharge, or bleeding.  Left breast with small biopsy site in the medial aspect of the breast.  No dominant masses palpable in the breast.  No nipple discharge or bleeding.   ECOG = 0  0 - Asymptomatic (Fully active, able to carry on all predisease activities without restriction)  1 - Symptomatic but completely ambulatory (Restricted in physically strenuous activity but ambulatory and able to carry out work of a light or sedentary  nature. For example, light housework, office work)  2 - Symptomatic, <50% in bed during the day (Ambulatory and capable of all self care but unable to carry out any work activities. Up and about more than 50% of waking hours)  3 - Symptomatic, >50% in bed, but not bedbound (Capable of only limited self-care, confined to bed or chair 50% or more of waking hours)  4 - Bedbound (Completely disabled. Cannot carry on any self-care. Totally confined to bed or chair)  5 - Death   Eustace Pen MM, Creech RH, Tormey DC, et al. [phone]). "Toxicity and response criteria of the [facility] Group". [place] Oncol. 5 (6): 649-55  LABORATORY DATA:  Lab Results  Component Value Date   WBC 5.4 07/09/2019   HGB 12.6 07/09/2019   HCT 39.5 07/09/2019   MCV 84.4 07/09/2019   PLT 238 07/09/2019   Lab Results  Component Value Date   NA 139 07/09/2019   K 4.3 07/09/2019   CL 103 07/09/2019   CO2 26 07/09/2019   Lab Results  Component Value Date   ALT 18 07/09/2019   AST 17 07/09/2019   ALKPHOS 118 07/09/2019   BILITOT 0.4 07/09/2019     PULMONARY FUNCTION TEST:   Recent Review Flowsheet Data    There is no flowsheet data to display.      RADIOGRAPHY: No results found.    IMPRESSION: Stage IA (T1c, cN0) Left Breast UIQ, Invasive Ductal Carcinoma, ER+ / PR+ / Her2-, Grade 2  Patient will be a good candidate for breast conservation with radiotherapy to left  breast. We discussed the general course of radiation, potential side effects, and toxicities with radiation and the patient is interested in this approach.    PLAN:  1. Lumpectomy and sentinel node procedure 2. Oncotype DX testing to determine the potential benefit of adjuvant chemotherapy 3. Adjuvant radiation therapy as breast conserving treatment 4. Aromatase inhibitor as adjuvant hormonal therapy   ------------------------------------------------  Blair Promise, PhD, MD  This document serves as a record of services personally performed by Gery Pray, MD. It was created on his behalf by Wilburn Mylar, a trained medical scribe. The creation of this record is based on the scribe's personal observations and the provider's statements to them. This document has been checked and approved by the attending provider.

## 2019-07-10 ENCOUNTER — Encounter: Payer: Self-pay | Admitting: Physical Therapy

## 2019-07-10 ENCOUNTER — Ambulatory Visit: Payer: BC Managed Care – PPO | Admitting: Physical Therapy

## 2019-07-10 DIAGNOSIS — C50312 Malignant neoplasm of lower-inner quadrant of left female breast: Secondary | ICD-10-CM | POA: Diagnosis not present

## 2019-07-10 DIAGNOSIS — Z170 Estrogen receptor positive status [ER+]: Secondary | ICD-10-CM | POA: Diagnosis not present

## 2019-07-10 DIAGNOSIS — G8929 Other chronic pain: Secondary | ICD-10-CM | POA: Diagnosis not present

## 2019-07-10 DIAGNOSIS — R293 Abnormal posture: Secondary | ICD-10-CM | POA: Diagnosis not present

## 2019-07-10 DIAGNOSIS — M25511 Pain in right shoulder: Secondary | ICD-10-CM | POA: Diagnosis not present

## 2019-07-10 NOTE — Therapy (Signed)
[place], [place], [address] Phone: [phone]   Fax:  [phone]  Physical Therapy Treatment  Patient Details  Name: Baneza Bartoszek MRN: [id number] Date of Birth: 08/06/1961 Referring Provider (PT): [name]   Encounter Date: 07/10/2019  PT End of Session - 07/10/19 1158    Visit Number  2    Number of Visits  9    Date for PT Re-Evaluation  09/03/19    PT Start Time  1100    PT Stop Time  1145    PT Time Calculation (min)  45 min    Activity Tolerance  Patient tolerated treatment well    Behavior During Therapy  [facility] for tasks assessed/performed       Past Medical History:  Diagnosis Date  . Acute medial meniscus tear   . Arthritis   . Diabetes mellitus without complication ([place])   . Hidradenitis suppurativa   . Serum lipids high     Past Surgical History:  Procedure Laterality Date  . AXILLARY HIDRADENITIS EXCISION    . CHONDROPLASTY Right 01/24/2017   Procedure: CHONDROPLASTY PATELLA  FEMORAL JOINT;  Surgeon: Dorna Leitz, MD;  Location: [place];  Service: Orthopedics;  Laterality: Right;  . KNEE ARTHROSCOPY WITH LATERAL MENISECTOMY Right 01/24/2017   Procedure: KNEE ARTHROSCOPY WITH LATERAL MENISECTOMY/ PARTIAL;  Surgeon: Dorna Leitz, MD;  Location: [place];  Service: Orthopedics;  Laterality: Right;  . KNEE ARTHROSCOPY WITH MEDIAL MENISECTOMY Right 01/24/2017   Procedure: ARTHROSCOPY KNEE, PARTIAL MEDIAL  MENISCECTOMY,;  Surgeon: Dorna Leitz, MD;  Location: [place];  Service: Orthopedics;  Laterality: Right;  . PERINEAL HIDRADENITIS EXCISION      There were no vitals filed for this visit.  Subjective Assessment - 07/10/19 1115    Subjective  As long as i don't move it I don't have pain    Pertinent History  Patient was diagnosed on 06/11/2019 with left grade II invasive ductal carcinoma breast cancer. It measures 1.1 cm and is  located in the lower inner quadrant. It is ER/PR positive and HER2 negative with a Ki67 of 15%. She had a right total knee replacement in 2018.    Patient Stated Goals  Decrease right shoulder pain, learn post op shoulder ROM HEP and lymphedema risk reduction practices.    Currently in Pain?  No/denies                       [facility] Adult PT Treatment/Exercise - 07/10/19 0001      Exercises   Exercises  Shoulder      Shoulder Exercises: Supine   Other Supine Exercises  meeks decompression     Other Supine Exercises  lying over purple ball at thoracic spine for anterior chest opening  Attempted to do shoulder flexion and horizontal adduction, but pt had too much pain       Shoulder Exercises: Sidelying   External Rotation  AROM;10 reps    ABduction  AROM;Right    ABduction Limitations  only to 90 degrees limited by pain     Other Sidelying Exercises  scapualr retraction       Modalities   Modalities  Iontophoresis      Iontophoresis   Type of Iontophoresis  Dexamethasone    Location  right shoulder at acromion     Dose  1 ml of 4 mg /ml dexamethasone     Time  6 hours       Manual Therapy   Manual Therapy  Joint mobilization;Soft tissue mobilization;Passive ROM    Joint Mobilization  gentle GH joint traction and gentle oscillarions, mobilizaion of clavicle without difficulty     Soft tissue mobilization  cross friction massage to biceps tendon     Passive ROM  right shoudler external rotation x 15-20 reps in pain free range for joint capsule mobility                PT Short Term Goals - 07/09/19 1216      PT SHORT TERM GOAL #1   Title  Patient will be independent with a HEP for right shoulder ROM and posture.    Time  4    Period  Weeks    Status  New    Target Date  08/06/19      PT SHORT TERM GOAL #2   Title  Patient will improve DASH score to </= 5 for improved overall shoudler function on the right side.    Baseline  11.36    Time  4    Period   Weeks    Status  New    Target Date  08/06/19      PT SHORT TERM GOAL #3   Title  Patient will report >/= 40% less right shoulder pain with daily tasks including household tasks and work tasks.    Time  4    Period  Weeks    Status  New    Target Date  08/06/19      PT SHORT TERM GOAL #4   Title  Patient will report >/= 25% less difficulty donning and doffing her sports bra.    Time  4    Period  Weeks    Status  New    Target Date  08/06/19      PT SHORT TERM GOAL #5   Title  Patient will increase right shoulder strength to >/= 4+/5 for improved ability to perform daily tasks.    Time  4    Period  Weeks    Status  New        PT Long Term Goals - 07/09/19 1215      PT LONG TERM GOAL #1   Title  Patient will demonstrate she has regained full left shoulder ROM and function post operatively compared to baselines.    Time  8    Period  Weeks    Status  New    Target Date  09/03/19            Plan - 07/10/19 1158    Clinical Impression Statement  began with postureal exercises, painfree ROM, gentle joing mobilizaions and ionto . pt did well and felt more chest openeing after session    PT Frequency  2x / week    PT Duration  2 weeks    PT Treatment/Interventions  Neuromuscular re-education    PT Next Visit Plan  assess effect of ionto right shoulderand continue  cross friction massage, gentle joint mobs and/or PROM; begin postural exercises for HEP - scap ret, doorway stretch, ER stretch, backward shoulder rolls, etc       Patient will benefit from skilled therapeutic intervention in order to improve the following deficits and impairments:  Decreased knowledge of precautions, Pain, Impaired UE functional use, Decreased strength, Postural dysfunction, Decreased range of motion  Visit Diagnosis: Abnormal posture  Chronic right shoulder pain  Problem List Patient Active Problem List   Diagnosis Date Noted  . Malignant neoplasm of upper-inner quadrant of  left breast in female, estrogen receptor positive ([place]) 07/04/2019  . Acute medial meniscal tear, right, initial encounter 01/24/2017  . Chondromalacia patellae, right knee 01/24/2017  . Acute lateral meniscal tear, right, initial encounter 01/24/2017  . Hyperlipidemia LDL goal < 100 03/11/2014  . Dysuria 03/11/2014  . Tobacco abuse 03/11/2014  . Routine general medical examination at a health care facility 03/11/2014  . Hypercalcemia 03/11/2014  . Need for pneumococcal vaccination 03/11/2014  . Other and unspecified hyperlipidemia 03/14/2013  . Diabetes mellitus type 2 in obese ([place]) 03/14/2013   Donato Heinz. Owens Shark PT  Norwood Levo 07/10/2019, 12:01 PM  [place], [place], [address] Phone: [phone]   Fax:  [phone]  Name: Jeanett Antonopoulos MRN: [id number] Date of Birth: 25-Apr-1961

## 2019-07-10 NOTE — Patient Instructions (Signed)

## 2019-07-15 ENCOUNTER — Ambulatory Visit: Payer: BC Managed Care – PPO | Admitting: Physical Therapy

## 2019-07-15 ENCOUNTER — Other Ambulatory Visit: Payer: Self-pay

## 2019-07-15 ENCOUNTER — Encounter: Payer: Self-pay | Admitting: Physical Therapy

## 2019-07-15 DIAGNOSIS — G8929 Other chronic pain: Secondary | ICD-10-CM

## 2019-07-15 DIAGNOSIS — R293 Abnormal posture: Secondary | ICD-10-CM

## 2019-07-15 DIAGNOSIS — C50312 Malignant neoplasm of lower-inner quadrant of left female breast: Secondary | ICD-10-CM | POA: Diagnosis not present

## 2019-07-15 DIAGNOSIS — Z170 Estrogen receptor positive status [ER+]: Secondary | ICD-10-CM | POA: Diagnosis not present

## 2019-07-15 DIAGNOSIS — M25511 Pain in right shoulder: Secondary | ICD-10-CM | POA: Diagnosis not present

## 2019-07-15 NOTE — Patient Instructions (Addendum)
Access Code: [facility]  URL: [email or web address]  Date: 07/15/2019  Prepared by: Manus Gunning   Exercises Isometric Shoulder Abduction at Wall - 6 reps - 1 sets - 6 sec hold                            - 1x daily - 7x weekly Isometric Shoulder Flexion at Wall - 6 reps - 1 sets - 6 sec hold - 1x daily - 7x weekly Isometric Shoulder Extension at Wall - 6 reps - 1 sets - 6 sec hold                            - 1x daily - 7x weekly Isometric Shoulder External Rotation at Wall - 6 reps - 1 sets - 6 sec hold - 1x daily - 7x weekly Standing Isometric Shoulder Internal Rotation at Doorway - 6 reps - 1 sets - 6 sec hold - 1x daily - 7x weekly Isometric Shoulder Adduction - 6 reps - 1 sets - 6 sec hold - 1x daily - 7x weekly  Over Head Pull: Wide Grip   Cancer [facility] [phone]   On back, knees bent, feet flat, band across thighs, elbows straight but relaxed. Pull hands apart giving band a gentle stretch. Keep a wide grip on band. Engage shoulder muscles throughout (back and down). Keeping elbows straight, bring arms up and over head (in pain free motion), hands toward floor. Keep pull steady on band. Return slowly, keeping pull steady, back to start. Repeat _10__ times. Band color __yellow____   Side Pull: Double Arm   On back, knees bent, feet flat. Arms perpendicular to body, shoulder level, elbows straight but relaxed. Engage shoulder muscles (squeeze back and down). Pull arms out to sides, elbows straight. Resistance band comes across collarbones, hands toward floor. Slowly return to starting position. Repeat _10__ times. Band color _yellow____    Shoulder Rotation: Double Arm   On back, knees bent, feet flat, elbows tucked at sides, bent 90, hands palms up. Squeeze shoulder blades together. Pull hands apart and down toward floor, keeping elbows near sides. Slowly return to starting position. Repeat _10__ times. Band color __yellow____

## 2019-07-15 NOTE — Therapy (Signed)
[place], [place], [address] Phone: [phone]   Fax:  [phone]  Physical Therapy Treatment  Patient Details  Name: Kathryn Webb MRN: [id number] Date of Birth: February 13, 1961 Referring Provider (PT): [name]   Encounter Date: 07/15/2019  PT End of Session - 07/15/19 1452    Visit Number  3    Number of Visits  9    Date for PT Re-Evaluation  09/03/19    PT Start Time  1165    PT Stop Time  1447    PT Time Calculation (min)  42 min    Activity Tolerance  Patient tolerated treatment well    Behavior During Therapy  [facility] for tasks assessed/performed       Past Medical History:  Diagnosis Date  . Acute medial meniscus tear   . Arthritis   . Diabetes mellitus without complication ([place])   . Hidradenitis suppurativa   . Serum lipids high     Past Surgical History:  Procedure Laterality Date  . AXILLARY HIDRADENITIS EXCISION    . CHONDROPLASTY Right 01/24/2017   Procedure: CHONDROPLASTY PATELLA  FEMORAL JOINT;  Surgeon: Dorna Leitz, MD;  Location: [place];  Service: Orthopedics;  Laterality: Right;  . KNEE ARTHROSCOPY WITH LATERAL MENISECTOMY Right 01/24/2017   Procedure: KNEE ARTHROSCOPY WITH LATERAL MENISECTOMY/ PARTIAL;  Surgeon: Dorna Leitz, MD;  Location: [place];  Service: Orthopedics;  Laterality: Right;  . KNEE ARTHROSCOPY WITH MEDIAL MENISECTOMY Right 01/24/2017   Procedure: ARTHROSCOPY KNEE, PARTIAL MEDIAL  MENISCECTOMY,;  Surgeon: Dorna Leitz, MD;  Location: [place];  Service: Orthopedics;  Laterality: Right;  . PERINEAL HIDRADENITIS EXCISION      There were no vitals filed for this visit.  Subjective Assessment - 07/15/19 1406    Subjective  My shoulder is about the same. It is not worse. I do think the ionto patch is working.    Pertinent History  Patient was diagnosed on 06/11/2019 with left grade II invasive ductal carcinoma  breast cancer. It measures 1.1 cm and is located in the lower inner quadrant. It is ER/PR positive and HER2 negative with a Ki67 of 15%. She had a right total knee replacement in 2018.    Patient Stated Goals  Decrease right shoulder pain, learn post op shoulder ROM HEP and lymphedema risk reduction practices.    Currently in Pain?  No/denies    Pain Score  0-No pain                       [facility] Adult PT Treatment/Exercise - 07/15/19 0001      Shoulder Exercises: Supine   Horizontal ABduction  Strengthening;Both;10 reps;Theraband   pt educated to squeeze shoulder baldes together throughout   Theraband Level (Shoulder Horizontal ABduction)  Level 1 (Yellow)    External Rotation  Strengthening;Both;10 reps;Theraband   pt educated to engage shoulder muscles throughout   Theraband Level (Shoulder External Rotation)  Level 1 (Yellow)    Flexion  Strengthening;Both;10 reps;Theraband   wide grip only, pt educated to engage shoulder muscles    Theraband Level (Shoulder Flexion)  Level 1 (Yellow)    Flexion Limitations  did not do narrow grip secondary to discomfort      Shoulder Exercises: Isometric Strengthening   Flexion  Other (comment)   6x6'', pt returned therapist demo   Extension  Other (comment)   6x6'', in standing, pt returned therapist demo  External Rotation  Other (comment)   6x6'', pt returned therapist demo   Internal Rotation  Other (comment)   6x6'' pt returned therapist demo   ABduction  Other (comment)   6x6'' pt returned therapist demo   ADduction  Other (comment)   6x6'' pt returned therapist demo     Modalities   Modalities  Iontophoresis      Iontophoresis   Type of Iontophoresis  Dexamethasone    Location  right shoulder at bicep tendon    Dose  1 ml of 4 mg /ml dexamethasone     Time  6 hours       Manual Therapy   Joint Mobilization  gentle GH joint traction and gentle oscillarions    Soft tissue mobilization  cross friction massage to  biceps tendon     Passive ROM  right shoudler external rotation x 15-20 reps in pain free range for joint capsule mobility                PT Short Term Goals - 07/09/19 1216      PT SHORT TERM GOAL #1   Title  Patient will be independent with a HEP for right shoulder ROM and posture.    Time  4    Period  Weeks    Status  New    Target Date  08/06/19      PT SHORT TERM GOAL #2   Title  Patient will improve DASH score to </= 5 for improved overall shoudler function on the right side.    Baseline  11.36    Time  4    Period  Weeks    Status  New    Target Date  08/06/19      PT SHORT TERM GOAL #3   Title  Patient will report >/= 40% less right shoulder pain with daily tasks including household tasks and work tasks.    Time  4    Period  Weeks    Status  New    Target Date  08/06/19      PT SHORT TERM GOAL #4   Title  Patient will report >/= 25% less difficulty donning and doffing her sports bra.    Time  4    Period  Weeks    Status  New    Target Date  08/06/19      PT SHORT TERM GOAL #5   Title  Patient will increase right shoulder strength to >/= 4+/5 for improved ability to perform daily tasks.    Time  4    Period  Weeks    Status  New        PT Long Term Goals - 07/09/19 1215      PT LONG TERM GOAL #1   Title  Patient will demonstrate she has regained full left shoulder ROM and function post operatively compared to baselines.    Time  8    Period  Weeks    Status  New    Target Date  09/03/19            Plan - 07/15/19 1417    Clinical Impression Statement  Began instructing pt in shoulder strengthening exercises since pt is able to move shoulder through entire ROM but has pain. Educated pt that all exercises should be pain free. Educated her in standing shoulder isometrics and issued these as part of HEP. Pt did not have any pain with these. Then instructed pt in  part of supine scap series that was pain free including horizontal abd, ER, and  wide grip shoulder flexion while educating pt to keep shoulders engaged throughout and as long as she did this she did not have any pain. Added these to her HEP. Applied ionto again today.    Rehab Potential  Excellent    PT Frequency  2x / week    PT Duration  2 weeks    PT Treatment/Interventions  ADLs/Self Care Home Management;Therapeutic exercise;Patient/family education;Neuromuscular re-education;Manual techniques;Passive range of motion;Taping    PT Next Visit Plan  see if supine scap and isometrics caused pain, assess effect of ionto right shoulderand continue  cross friction massage, gentle joint mobs and/or PROM; begin postural exercises for HEP - scap ret, doorway stretch, ER stretch, backward shoulder rolls, etc    PT Home Exercise Plan  Post op shoulder ROM HEP given today,[id number] (shoulder isometrics), part of supine scap    Consulted and Agree with Plan of Care  Patient       Patient will benefit from skilled therapeutic intervention in order to improve the following deficits and impairments:  Decreased knowledge of precautions, Pain, Impaired UE functional use, Decreased strength, Postural dysfunction, Decreased range of motion  Visit Diagnosis: Abnormal posture  Chronic right shoulder pain     Problem List Patient Active Problem List   Diagnosis Date Noted  . Malignant neoplasm of upper-inner quadrant of left breast in female, estrogen receptor positive ([place]) 07/04/2019  . Acute medial meniscal tear, right, initial encounter 01/24/2017  . Chondromalacia patellae, right knee 01/24/2017  . Acute lateral meniscal tear, right, initial encounter 01/24/2017  . Hyperlipidemia LDL goal < 100 03/11/2014  . Dysuria 03/11/2014  . Tobacco abuse 03/11/2014  . Routine general medical examination at a health care facility 03/11/2014  . Hypercalcemia 03/11/2014  . Need for pneumococcal vaccination 03/11/2014  . Other and unspecified hyperlipidemia 03/14/2013  . Diabetes mellitus  type 2 in obese [facility]) 03/14/2013    Allyson Sabal [facility] 07/15/2019, 3:01 PM  [place], [place], [address] Phone: [phone]   Fax:  [phone]  Name: Sheccid Lahmann MRN: [id number] Date of Birth: 12-07-1960  Manus Gunning, PT 07/15/19 3:01 PM

## 2019-07-16 ENCOUNTER — Telehealth: Payer: Self-pay

## 2019-07-16 NOTE — Telephone Encounter (Addendum)
Nutrition Assessment  Reason for Assessment:  Pt attended Breast Clinic on 8/19 and received nutrition packet from Nurse Navigator  ASSESSMENT:  58 year old female with left breast cancer.  Planning lumpectomy, oncotype, adjuvant radiation and antiestrogens.  Past medical history of DM.    Spoke with patient via phone this am to introduce self and service at [facility].  Medications:  Jardiance, Vit D2, metformin  Labs: glucose 114, calcium 11.4  Anthropometrics:   Height: 71 inches Weight: 221 lb 6.4 oz  BMI: 30  Stable weight   NUTRITION DIAGNOSIS: Food and nutrition related knowledge deficit related to new diagnosis of breast cancer as evidenced by no prior need for nutrition related information.  INTERVENTION:   Discussed briefly packet of information regarding nutritional tips for breast cancer patients.  Questions answered.   Contact information provided and patient knows to contact me with questions/concerns.    MONITORING, EVALUATION, and GOAL: Pt will consume a healthy plant based diet to maintain lean body mass throughout treatment.   Lawrencia Mauney B. Zenia Resides, [place], [place] Registered Dietitian [phone] (pager)

## 2019-07-17 ENCOUNTER — Ambulatory Visit: Payer: BC Managed Care – PPO | Admitting: Physical Therapy

## 2019-07-17 ENCOUNTER — Other Ambulatory Visit: Payer: Self-pay

## 2019-07-17 ENCOUNTER — Telehealth: Payer: Self-pay | Admitting: *Deleted

## 2019-07-17 DIAGNOSIS — M25511 Pain in right shoulder: Secondary | ICD-10-CM

## 2019-07-17 DIAGNOSIS — Z170 Estrogen receptor positive status [ER+]: Secondary | ICD-10-CM | POA: Diagnosis not present

## 2019-07-17 DIAGNOSIS — C50312 Malignant neoplasm of lower-inner quadrant of left female breast: Secondary | ICD-10-CM | POA: Diagnosis not present

## 2019-07-17 DIAGNOSIS — G8929 Other chronic pain: Secondary | ICD-10-CM

## 2019-07-17 DIAGNOSIS — R293 Abnormal posture: Secondary | ICD-10-CM

## 2019-07-17 NOTE — Therapy (Signed)
[place], [place], [address] Phone: [phone]   Fax:  [phone]  Physical Therapy Treatment  Patient Details  Name: Kathryn Webb MRN: [id number] Date of Birth: Mar 17, 1961 Referring Provider (PT): [name]   Encounter Date: 07/17/2019  PT End of Session - 07/17/19 1155    Visit Number  4    Number of Visits  9    Date for PT Re-Evaluation  09/03/19    PT Start Time  1100    PT Stop Time  1145    PT Time Calculation (min)  45 min    Activity Tolerance  Patient tolerated treatment well    Behavior During Therapy  [facility] for tasks assessed/performed       Past Medical History:  Diagnosis Date  . Acute medial meniscus tear   . Arthritis   . Diabetes mellitus without complication ([place])   . Hidradenitis suppurativa   . Serum lipids high     Past Surgical History:  Procedure Laterality Date  . AXILLARY HIDRADENITIS EXCISION    . CHONDROPLASTY Right 01/24/2017   Procedure: CHONDROPLASTY PATELLA  FEMORAL JOINT;  Surgeon: Dorna Leitz, MD;  Location: [place];  Service: Orthopedics;  Laterality: Right;  . KNEE ARTHROSCOPY WITH LATERAL MENISECTOMY Right 01/24/2017   Procedure: KNEE ARTHROSCOPY WITH LATERAL MENISECTOMY/ PARTIAL;  Surgeon: Dorna Leitz, MD;  Location: [place];  Service: Orthopedics;  Laterality: Right;  . KNEE ARTHROSCOPY WITH MEDIAL MENISECTOMY Right 01/24/2017   Procedure: ARTHROSCOPY KNEE, PARTIAL MEDIAL  MENISCECTOMY,;  Surgeon: Dorna Leitz, MD;  Location: [place];  Service: Orthopedics;  Laterality: Right;  . PERINEAL HIDRADENITIS EXCISION      There were no vitals filed for this visit.  Subjective Assessment - 07/17/19 1150    Subjective  Pt states she is feeling better.  She does not have the pain she originally had, just slight discomfort. She got her surgery scheuled for Sept 8    Pertinent History  Patient was diagnosed  on 06/11/2019 with left grade II invasive ductal carcinoma breast cancer. It measures 1.1 cm and is located in the lower inner quadrant. It is ER/PR positive and HER2 negative with a Ki67 of 15%. She had a right total knee replacement in 2018.    Patient Stated Goals  Decrease right shoulder pain, learn post op shoulder ROM HEP and lymphedema risk reduction practices.    Currently in Pain?  No/denies   just a little twinge at times                      [facility] Adult PT Treatment/Exercise - 07/17/19 0001      Self-Care   Self-Care  Other Self-Care Comments    Other Self-Care Comments   reviewed getting a compression bra for post op and how to wear binder with straps as well as post op exercises       Shoulder Exercises: Supine   Other Supine Exercises  supine flexion with wide grip on dowel for range of motion after surgery       Shoulder Exercises: Standing   External Rotation  Strengthening;Right;10 reps;Theraband    Theraband Level (Shoulder External Rotation)  Level 2 (Red)    Row  Strengthening;Right;10 reps    Theraband Level (Shoulder Row)  Level 2 (Red)      Iontophoresis   Type of Iontophoresis  Dexamethasone    Location  right shoulder  at bicep tendon    Dose  1 ml of 4 mg /ml dexamethasone     Time  6 hours                PT Short Term Goals - 07/09/19 1216      PT SHORT TERM GOAL #1   Title  Patient will be independent with a HEP for right shoulder ROM and posture.    Time  4    Period  Weeks    Status  New    Target Date  08/06/19      PT SHORT TERM GOAL #2   Title  Patient will improve DASH score to </= 5 for improved overall shoudler function on the right side.    Baseline  11.36    Time  4    Period  Weeks    Status  New    Target Date  08/06/19      PT SHORT TERM GOAL #3   Title  Patient will report >/= 40% less right shoulder pain with daily tasks including household tasks and work tasks.    Time  4    Period  Weeks    Status   New    Target Date  08/06/19      PT SHORT TERM GOAL #4   Title  Patient will report >/= 25% less difficulty donning and doffing her sports bra.    Time  4    Period  Weeks    Status  New    Target Date  08/06/19      PT SHORT TERM GOAL #5   Title  Patient will increase right shoulder strength to >/= 4+/5 for improved ability to perform daily tasks.    Time  4    Period  Weeks    Status  New        PT Long Term Goals - 07/09/19 1215      PT LONG TERM GOAL #1   Title  Patient will demonstrate she has regained full left shoulder ROM and function post operatively compared to baselines.    Time  8    Period  Weeks    Status  New    Target Date  09/03/19            Plan - 07/17/19 1155    Clinical Impression Statement  Pt is making progress with decreased pain and in right shoulder.  continues with ionto and upgraded exercise for right scapular retraction and shoulder external rotation. Spent time with discussion of post op exercise today    Rehab Potential  Excellent    PT Frequency  2x / week    PT Duration  2 weeks    PT Next Visit Plan  see if supine scap and isometrics caused pain, assess effect of ionto right shoulderand continue  cross friction massage, gentle joint mobs and/or PROM; begin postural exercises for HEP - scap ret, doorway stretch, ER stretch, backward shoulder rolls, etc       Patient will benefit from skilled therapeutic intervention in order to improve the following deficits and impairments:  Decreased knowledge of precautions, Pain, Impaired UE functional use, Decreased strength, Postural dysfunction, Decreased range of motion  Visit Diagnosis: Abnormal posture  Chronic right shoulder pain     Problem List Patient Active Problem List   Diagnosis Date Noted  . Malignant neoplasm of upper-inner quadrant of left breast in female, estrogen receptor positive ([place]) 07/04/2019  .  Acute medial meniscal tear, right, initial encounter 01/24/2017  .  Chondromalacia patellae, right knee 01/24/2017  . Acute lateral meniscal tear, right, initial encounter 01/24/2017  . Hyperlipidemia LDL goal < 100 03/11/2014  . Dysuria 03/11/2014  . Tobacco abuse 03/11/2014  . Routine general medical examination at a health care facility 03/11/2014  . Hypercalcemia 03/11/2014  . Need for pneumococcal vaccination 03/11/2014  . Other and unspecified hyperlipidemia 03/14/2013  . Diabetes mellitus type 2 in obese ([place]) 03/14/2013   Donato Heinz. Owens Shark PT  Nor[place] Levo 07/17/2019, 11:58 AM  [place], [place], [address] Phone: [phone]   Fax:  [phone]  Name: Kathryn Webb MRN: [id number] Date of Birth: 1961/09/27

## 2019-07-17 NOTE — Patient Instructions (Addendum)
First of all, check with your insurance company to see if provider is in network    [facility] (for wigs and compression sleeves / gloves/gauntlets )  [address] [place], [place] [address] [phone]  Will file some insurances --- call for appointment   [facility] (for mastectomy prosthetics and garments) [address] [place], [place] [address] [phone] Will file some insurances --- call for appointment  [facility]  [address]  [place], [place] [address] [phone] Lower extremity garments  [facility] [address] [place], [place]  [address] [phone]  Cathy Rubel ( Medicaid certified lymphedema fitter) [phone] [email or web address]  Melissa Meares  [facility]  [phone]  [facility] [address] [place], [place] [address] [phone]  Other Resources: National Lymphedema Network:  www.lymphnet.org www.Klosetraining.com for patient articles and self manual lymph drainage information www.lymphedemablog.com has informative articles.  www.compressionguru.com www.lymphedemaproducts.com www.brightlifedirect.com Www.compressionguru.com    SHOULDER: Flexion - Supine (Cane)        [facility] [phone]    Hold cane in both hands. Raise arms up overhead. Do not allow back to arch. Hold _5__ seconds. Do __5-10__ times; __1-2__ times a day.   SELF ASSISTED WITH OBJECT: Shoulder Abduction / Adduction - Supine    Hold cane with both hands. Move both arms from side to side, keep elbows straight.  Hold when stretch felt for __5__ seconds. Repeat __5-10__ times; __1-2__ times a day. Once this becomes easier progress to third picture bringing affected arm towards ear by staying out to side. Same hold for _5_seconds. Repeat  _5-10_ times, _1-2_ times/day.  Shoulder Blade Stretch    Clasp fingers behind head with elbows touching in front of face. Pull elbows back  while pressing shoulder blades together. Relax and hold as tolerated, can place pillow under elbow here for comfort as needed and to allow for prolonged stretch.  Repeat __5__ times. Do __1-2__ sessions per day.     SHOULDER: External Rotation - Supine (Cane)    Hold cane with both hands. Rotate arm away from body. Keep elbow on floor and next to body. _5-10__ reps per set, hold 5 seconds, _1-2__ sets per day. Add towel to keep elbow at side.  Copyright  VHI. All rights reserved.       

## 2019-07-17 NOTE — Telephone Encounter (Signed)
Left message for a return phone call to follow up from [facility]. °

## 2019-07-21 ENCOUNTER — Encounter (HOSPITAL_BASED_OUTPATIENT_CLINIC_OR_DEPARTMENT_OTHER): Payer: Self-pay | Admitting: *Deleted

## 2019-07-21 ENCOUNTER — Other Ambulatory Visit: Payer: Self-pay

## 2019-07-22 ENCOUNTER — Encounter: Payer: Self-pay | Admitting: Physical Therapy

## 2019-07-22 ENCOUNTER — Ambulatory Visit: Payer: BC Managed Care – PPO | Attending: Surgery | Admitting: Physical Therapy

## 2019-07-22 DIAGNOSIS — R293 Abnormal posture: Secondary | ICD-10-CM | POA: Diagnosis not present

## 2019-07-22 DIAGNOSIS — M25511 Pain in right shoulder: Secondary | ICD-10-CM | POA: Insufficient documentation

## 2019-07-22 DIAGNOSIS — G8929 Other chronic pain: Secondary | ICD-10-CM | POA: Diagnosis not present

## 2019-07-22 NOTE — Therapy (Signed)
[place], [place], [address] Phone: [phone]   Fax:  [phone]  Physical Therapy Treatment  Patient Details  Name: Kathryn Webb MRN: [id number] Date of Birth: November 03, 1961 Referring Provider (PT): [name]   Encounter Date: 07/22/2019  PT End of Session - 07/22/19 1628    Visit Number  5    Number of Visits  9    Date for PT Re-Evaluation  09/03/19    PT Start Time  6962    PT Stop Time  1615    PT Time Calculation (min)  45 min    Activity Tolerance  Patient tolerated treatment well    Behavior During Therapy  [facility] for tasks assessed/performed       Past Medical History:  Diagnosis Date  . Acute medial meniscus tear   . Arthritis   . Cancer ([place]) 06/2019   left breast IDC  . Diabetes mellitus without complication ([place])   . GERD (gastroesophageal reflux disease)   . Hidradenitis suppurativa   . Serum lipids high     Past Surgical History:  Procedure Laterality Date  . AXILLARY HIDRADENITIS EXCISION    . CHONDROPLASTY Right 01/24/2017   Procedure: CHONDROPLASTY PATELLA  FEMORAL JOINT;  Surgeon: Dorna Leitz, MD;  Location: [place];  Service: Orthopedics;  Laterality: Right;  . KNEE ARTHROSCOPY WITH LATERAL MENISECTOMY Right 01/24/2017   Procedure: KNEE ARTHROSCOPY WITH LATERAL MENISECTOMY/ PARTIAL;  Surgeon: Dorna Leitz, MD;  Location: [place];  Service: Orthopedics;  Laterality: Right;  . KNEE ARTHROSCOPY WITH MEDIAL MENISECTOMY Right 01/24/2017   Procedure: ARTHROSCOPY KNEE, PARTIAL MEDIAL  MENISCECTOMY,;  Surgeon: Dorna Leitz, MD;  Location: [place];  Service: Orthopedics;  Laterality: Right;  . PERINEAL HIDRADENITIS EXCISION      There were no vitals filed for this visit.  Subjective Assessment - 07/22/19 1540    Subjective  pt thinks she may have aggravated her shoulder by doing her housework this weekend. she got her  compression bra today for after surgery    Pertinent History  Patient was diagnosed on 06/11/2019 with left grade II invasive ductal carcinoma breast cancer. It measures 1.1 cm and is located in the lower inner quadrant. It is ER/PR positive and HER2 negative with a Ki67 of 15%. She had a right total knee replacement in 2018.    Patient Stated Goals  Decrease right shoulder pain, learn post op shoulder ROM HEP and lymphedema risk reduction practices.    Currently in Pain?  Yes    Pain Score  4     Pain Location  Shoulder    Pain Orientation  Right    Pain Descriptors / Indicators  Sharp    Pain Type  Acute pain    Pain Onset  More than a month ago         [facility] PT Assessment - 07/22/19 0001      AROM   Right Shoulder Flexion  160 Degrees    Right Shoulder ABduction  150 Degrees    Right Shoulder External Rotation  75 Degrees                   [facility] Adult PT Treatment/Exercise - 07/22/19 0001      Self-Care   Self-Care  Other Self-Care Comments    Other Self-Care Comments   discussed postural improvments for work       Shoulder Exercises: Standing  External Rotation  Strengthening;Right;10 reps   one step of walkouts with towel roll at axilla    Theraband Level (Shoulder External Rotation)  Level 2 (Red)    Extension  Right;10 reps   2 step of walkouts    Theraband Level (Shoulder Extension)  Level 2 (Red)      Shoulder Exercises: Stretch   Corner Stretch  3 reps   with forearms on wall for pec minor stretch   Other Shoulder Stretches  butterfly stretch . suggested she use a pillow under her left elbow to start after surgery       Iontophoresis   Type of Iontophoresis  Dexamethasone    Location  right shoulder at bicep tendon    Dose  1 ml of 4 mg /ml dexamethasone     Time  6 hours              PT Education - 07/22/19 1631    Education Details  HEP, postureal improvements for work, about shoulder impingment    Person(s) Educated  Patient     Methods  Explanation;Handout    Comprehension  Verbalized understanding       PT Short Term Goals - 07/22/19 1634      PT SHORT TERM GOAL #1   Title  Patient will be independent with a HEP for right shoulder ROM and posture.    Status  Achieved      PT SHORT TERM GOAL #2   Title  Patient will improve DASH score to </= 5 for improved overall shoudler function on the right side.    Baseline  11.36    Status  On-going      PT SHORT TERM GOAL #3   Title  Patient will report >/= 40% less right shoulder pain with daily tasks including household tasks and work tasks.    Status  Achieved      PT SHORT TERM GOAL #4   Title  Patient will report >/= 25% less difficulty donning and doffing her sports bra.    Status  On-going      PT SHORT TERM GOAL #5   Title  Patient will increase right shoulder strength to >/= 4+/5 for improved ability to perform daily tasks.    Status  On-going        PT Long Term Goals - 07/22/19 1634      PT LONG TERM GOAL #1   Title  Patient will demonstrate she has regained full left shoulder ROM and function post operatively compared to baselines.    Time  8    Period  Weeks    Status  On-going            Plan - 07/22/19 1633    PT Treatment/Interventions  ADLs/Self Care Home Management;Therapeutic exercise;Patient/family education;Neuromuscular re-education;Manual techniques;Passive range of motion;Taping    PT Next Visit Plan  reassessment    PT Home Exercise Plan  Post op shoulder ROM HEP given today,[id number] (shoulder isometrics), part of supine scap,ccess Code: [id number]    Consulted and Agree with Plan of Care  Patient       Patient will benefit from skilled therapeutic intervention in order to improve the following deficits and impairments:  Decreased knowledge of precautions, Pain, Impaired UE functional use, Decreased strength, Postural dysfunction, Decreased range of motion  Visit Diagnosis: Abnormal posture  Chronic right shoulder  pain     Problem List Patient Active Problem List   Diagnosis Date Noted  . Malignant  neoplasm of upper-inner quadrant of left breast in female, estrogen receptor positive ([place]) 07/04/2019  . Acute medial meniscal tear, right, initial encounter 01/24/2017  . Chondromalacia patellae, right knee 01/24/2017  . Acute lateral meniscal tear, right, initial encounter 01/24/2017  . Hyperlipidemia LDL goal < 100 03/11/2014  . Dysuria 03/11/2014  . Tobacco abuse 03/11/2014  . Routine general medical examination at a health care facility 03/11/2014  . Hypercalcemia 03/11/2014  . Need for pneumococcal vaccination 03/11/2014  . Other and unspecified hyperlipidemia 03/14/2013  . Diabetes mellitus type 2 in obese ([place]) 03/14/2013   Donato Heinz. Owens Shark PT  Norwood Levo 07/22/2019, 4:37 PM  [place], [place], [address] Phone: [phone]   Fax:  [phone]  Name: Kathryn Webb MRN: [id number] Date of Birth: 1961/03/17

## 2019-07-22 NOTE — Patient Instructions (Signed)
Access Code: [email or web address]  URL: [email or web address]  Date: 07/22/2019  Prepared by: Maudry Diego   Exercises Seated Passive Cervical Retraction - 10 reps - 3 sets - 1x daily - 7x weekly Corner Pec Minor Stretch - 3 reps - 3 sets - for 10 hold - 1x daily - 7x weekly Supine Chest Stretch with Elbows Bent - 10 Reps - 3 Sets - 1x daily - 7x weekly Shoulder Extension Reactive Isometrics with Elbow at 90 - 10 reps - 3 sets - 1x daily - 7x weekly Shoulder External Rotation Reactive Isometrics - 10 reps - 3 sets - 1x daily - 7x weekly Shoulder W - External Rotation with Resistance - 10 Reps - 3 Sets                            - 1x daily - 7x weekly Patient Education Shoulder Impingement Office Posture

## 2019-07-23 ENCOUNTER — Ambulatory Visit: Payer: BC Managed Care – PPO | Admitting: Physical Therapy

## 2019-07-24 ENCOUNTER — Encounter: Payer: Self-pay | Admitting: Physical Therapy

## 2019-07-25 ENCOUNTER — Telehealth: Payer: Self-pay | Admitting: Hematology and Oncology

## 2019-07-25 ENCOUNTER — Encounter (HOSPITAL_BASED_OUTPATIENT_CLINIC_OR_DEPARTMENT_OTHER)
Admission: RE | Admit: 2019-07-25 | Discharge: 2019-07-25 | Disposition: A | Discharge status: Home / Self Care | Payer: BC Managed Care – PPO | Source: Ambulatory Visit | Attending: Otolaryngology | Admitting: Otolaryngology

## 2019-07-25 ENCOUNTER — Other Ambulatory Visit: Payer: Self-pay

## 2019-07-25 ENCOUNTER — Other Ambulatory Visit (HOSPITAL_COMMUNITY)
Admission: RE | Admit: 2019-07-25 | Discharge: 2019-07-25 | Disposition: A | Discharge status: Home / Self Care | Payer: BC Managed Care – PPO | Source: Ambulatory Visit | Attending: Surgery | Admitting: Surgery

## 2019-07-25 DIAGNOSIS — Z20828 Contact with and (suspected) exposure to other viral communicable diseases: Secondary | ICD-10-CM | POA: Insufficient documentation

## 2019-07-25 DIAGNOSIS — I10 Essential (primary) hypertension: Secondary | ICD-10-CM | POA: Diagnosis not present

## 2019-07-25 DIAGNOSIS — E119 Type 2 diabetes mellitus without complications: Secondary | ICD-10-CM | POA: Insufficient documentation

## 2019-07-25 DIAGNOSIS — R001 Bradycardia, unspecified: Secondary | ICD-10-CM | POA: Diagnosis not present

## 2019-07-25 DIAGNOSIS — Z01818 Encounter for other preprocedural examination: Secondary | ICD-10-CM | POA: Insufficient documentation

## 2019-07-25 NOTE — Telephone Encounter (Signed)
Scheduled appt per 9/4 sch message - pt aware of appt date and time

## 2019-07-25 NOTE — H&P (Signed)
Kathryn Webb  Location: [facility] Patient #: [id number] DOB: 1960-11-28 Undefined / Language: Undefined / Race: Black or African American Female  History of Present Illness   The patient is a 58 year old female who presents with a complaint of breast cancer.  The PCP is Kathryn Webb, [place]  The patient was referred by Dr. Emmit Webb  The pateint is at the Breast [facility] - Oncology is Drs. Kathryn Webb and Kathryn Webb  She is by herself, but she got her mother and sister on the phone.  [The Covid-19 virus has disrupted normal medical care in [place] and across the nation. We have sometimes had to alter normal surgical/medical care to limit this epidemic and we have explained these changes to the patient.]  She had not had a mammogram in 3 years. She has no family history of breast cancer. [name] had no prior breast problems or biopsies. She is not on hormones.  Mammograms: [facility] - 06/20/2019 - 1.1 cm mass at the 9 o'clock position of the left breast Biopsy: 07/02/2019 - Left breast biopsy at 9 o'clock ([id number]) shows IDC, grade 2, ER - 100%, PR - 100%, Ki67 - 15%, Her2Neu - neg Family history of breast or ovarian cancer: On hormone therapy:  I discussed the options for breast cancer treatment with the patient. The patient is at the [facility], which includes medical oncology and radiation oncology. I discussed the surgical options of lumpectomy vs. mastectomy. If mastectomy, there is the possibility of reconstruction. I discussed the options of lymph node biopsy. The treatment plan depends on the pathologic staging of the tumor and the patient's personal wishes. The risks of surgery include, but are not limited to, bleeding, infection, the need for further surgery, and nerve injury. The patient has been given literature on the treatment of breast cancer.  Plan: 1) Left breast lumpectomy (seed localization) and  left axillary sentinel lymph node biopsy, 2. Oncotype, 3. Radiation tx, 4. Anti hormonal tx  Past Medical History: 1. Hidradenitis Both axillas skin excised in the 1980's She still gets occasional outbreaks in the skin around her breast and buttocks. 2. DM x 6 years 3. Smokes Trying to quit 4. Colonoscopy by [facility] (? name) 2014. Needs update? 5. Right shoulder bother her 6. had right knee replaced in 2018 - [facility]   Social History: Unmarried. No children. Her mother, Kathryn Webb, and her sister, Kathryn Webb, were on the phone. 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 Because she works at [employer] - I gave her some information no the Covid-19 vaccine trial  Past Surgical History Kathryn Pummel, RN; 07/09/2019 7:25 AM) Breast Biopsy  Left. Knee Surgery  Right.  Diagnostic Studies History Kathryn Pummel, RN; 07/09/2019 7:25 AM) Colonoscopy  >10 years ago Mammogram  within last year Pap Smear  1-5 years ago  Medication History Kathryn Pummel, RN; 07/09/2019 7:26 AM) Medications Reconciled  Social History Kathryn Pummel, RN; 07/09/2019 7:25 AM) Alcohol use  Occasional alcohol use. Caffeine use  Carbonated beverages, Coffee. No drug use  Tobacco use  Former smoker.  Family History Kathryn Pummel, RN; 07/09/2019 7:25 AM) Alcohol Abuse  Family Members In General. Arthritis  Mother. Diabetes Mellitus  Brother, Father, Mother, Sister. Heart Disease  Mother. Heart disease in female family member before age 57  Hypertension  Brother, Mother, Sister. Kidney Disease  Father. Migraine Headache  Sister.  Pregnancy / Birth History Kathryn Pummel, RN; 07/09/2019 7:25  AM) Age at menarche  80 years. Age of menopause  55-50 Gravida  0 Irregular periods  Para  0  Other Problems Kathryn Pummel, RN; 07/09/2019 7:25 AM) Diabetes Mellitus  Hypercholesterolemia     Review of Systems  Kathryn Spillers Ledford RN; 07/09/2019 7:25 AM) General Not Present- Appetite Loss, Chills, Fatigue, Fever, Night Sweats, Weight Gain and Weight Loss. Skin Not Present- Change in Wart/Mole, Dryness, Hives, Jaundice, New Lesions, Non-Healing Wounds, Rash and Ulcer. HEENT Not Present- Earache, Hearing Loss, Hoarseness, Nose Bleed, Oral Ulcers, Ringing in the Ears, Seasonal Allergies, Sinus Pain, Sore Throat, Visual Disturbances, Wears glasses/contact lenses and Yellow Eyes. Respiratory Not Present- Bloody sputum, Chronic Cough, Difficulty Breathing, Snoring and Wheezing. Breast Not Present- Breast Mass, Breast Pain, Nipple Discharge and Skin Changes. Cardiovascular Not Present- Chest Pain, Difficulty Breathing Lying Down, Leg Cramps, Palpitations, Rapid Heart Rate, Shortness of Breath and Swelling of Extremities. Gastrointestinal Not Present- Abdominal Pain, Bloating, Bloody Stool, Change in Bowel Habits, Chronic diarrhea, Constipation, Difficulty Swallowing, Excessive gas, Gets full quickly at meals, Hemorrhoids, Indigestion, Nausea, Rectal Pain and Vomiting. Female Genitourinary Not Present- Frequency, Nocturia, Painful Urination, Pelvic Pain and Urgency. Musculoskeletal Not Present- Back Pain, Joint Pain, Joint Stiffness, Muscle Pain, Muscle Weakness and Swelling of Extremities. Neurological Not Present- Decreased Memory, Fainting, Headaches, Numbness, Seizures, Tingling, Tremor, Trouble walking and Weakness. Psychiatric Not Present- Anxiety, Bipolar, Change in Sleep Pattern, Depression, Fearful and Frequent crying. Endocrine Present- Hot flashes. Not Present- Cold Intolerance, Excessive Hunger, Hair Changes, Heat Intolerance and New Diabetes. Hematology Not Present- Blood Thinners, Easy Bruising, Excessive bleeding, Gland problems, HIV and Persistent Infections.   Physical Exam  General: WN AA F who is alert and generally healthy appearing. She is wearing a mask. Skin: Inspection and palpation of the skin  unremarkable.  Eyes: Conjunctivae white, pupils equal. Face, ears, nose, mouth, and throat: Face - normal. Normal ears and nose. Lips and teeth normal.  Neck: Supple. No mass. Trachea midline. No thyroid mass. Lymph Nodes: No supraclavicular or cervical adenopathy.  No axillary adenopathy. She has incisions in both axillas of prior skin excision for hidradenitis  Lungs: Normal respiratory effort. Clear to auscultation and symmetric breath sounds. Cardiovascular: Regular rate and rythm. Normal auscultation of the heart. No murmur or rub.  Breast: Right - Large, no mass or nodule.  Left - Fullness in the UIQ of the left breast - biopsy hematoma ???  Abdomen: Soft. No mass. Liver and spleen not palpable. No tenderness. No hernia. Normal bowel sounds. No abdominal scars. Rectal: Not done.  Musculoskeletal/extremities: Normal gait.  Good strength and ROM in upper and lower extremities.  Has some right shoulder discomfort and mildly limited motion.  Neurologic: Grossly intact to motor and sensory function. Psychiatric: Has normal mood and affect.  Judgement and insight appear normal.   Assessment & Plan  1.  MALIGNANT NEOPLASM OF LEFT BREAST, STAGE 1, ESTROGEN RECEPTOR POSITIVE (M08.676) Story: 07/02/2019 - Left breast biopsy at 9 o'clock ([id number]) shows IDC, grade 2, ER - 100%, PR - 100%, Ki67 - 15%, Her2Neu - neg  Oncology - Gudena and Kathryn Webb  Plan:   1. Left breast lumpectomy (seed localization) and left axillary sentinel lymph node biopsy    2. Oncotype   3. Radiation tx   4. Anti hormonal tx  2.  SMOKES (F17.200)  Impression: trying to quit 3. Hidradenitis Both axillas skin excised in the 1980's She still gets occasional outbreaks in the skin around her breast and buttocks. 4. DM x 6 years  Alphonsa Overall, MD, [facility] Surgery Pager: [phone] Office phone:  [phone]

## 2019-07-25 NOTE — Progress Notes (Signed)

## 2019-07-26 LAB — NOVEL CORONAVIRUS, NAA (HOSP ORDER, SEND-OUT TO REF LAB; TAT 18-24 HRS): SARS-CoV-2, NAA: NOT DETECTED

## 2019-07-29 ENCOUNTER — Ambulatory Visit (HOSPITAL_COMMUNITY)
Admission: RE | Admit: 2019-07-29 | Discharge: 2019-07-29 | Disposition: A | Discharge status: Home / Self Care | Payer: BC Managed Care – PPO | Source: Ambulatory Visit | Attending: Surgery | Admitting: Surgery

## 2019-07-29 ENCOUNTER — Ambulatory Visit (HOSPITAL_BASED_OUTPATIENT_CLINIC_OR_DEPARTMENT_OTHER)
Admission: RE | Admit: 2019-07-29 | Discharge: 2019-07-29 | Disposition: A | Discharge status: Home / Self Care | Payer: BC Managed Care – PPO | Attending: Surgery | Admitting: Surgery

## 2019-07-29 ENCOUNTER — Ambulatory Visit (HOSPITAL_BASED_OUTPATIENT_CLINIC_OR_DEPARTMENT_OTHER): Payer: BC Managed Care – PPO | Admitting: Anesthesiology

## 2019-07-29 ENCOUNTER — Encounter (HOSPITAL_BASED_OUTPATIENT_CLINIC_OR_DEPARTMENT_OTHER): Payer: Self-pay | Admitting: Anesthesiology

## 2019-07-29 ENCOUNTER — Encounter (HOSPITAL_BASED_OUTPATIENT_CLINIC_OR_DEPARTMENT_OTHER)
Admission: RE | Disposition: A | Discharge status: Home / Self Care | Payer: Self-pay | Source: Home / Self Care | Attending: Surgery

## 2019-07-29 ENCOUNTER — Other Ambulatory Visit: Payer: Self-pay

## 2019-07-29 DIAGNOSIS — E7800 Pure hypercholesterolemia, unspecified: Secondary | ICD-10-CM | POA: Diagnosis not present

## 2019-07-29 DIAGNOSIS — K219 Gastro-esophageal reflux disease without esophagitis: Secondary | ICD-10-CM | POA: Diagnosis not present

## 2019-07-29 DIAGNOSIS — Z79899 Other long term (current) drug therapy: Secondary | ICD-10-CM | POA: Diagnosis not present

## 2019-07-29 DIAGNOSIS — Z87891 Personal history of nicotine dependence: Secondary | ICD-10-CM | POA: Insufficient documentation

## 2019-07-29 DIAGNOSIS — C50212 Malignant neoplasm of upper-inner quadrant of left female breast: Secondary | ICD-10-CM | POA: Diagnosis not present

## 2019-07-29 DIAGNOSIS — Z8619 Personal history of other infectious and parasitic diseases: Secondary | ICD-10-CM | POA: Diagnosis not present

## 2019-07-29 DIAGNOSIS — Z96651 Presence of right artificial knee joint: Secondary | ICD-10-CM | POA: Insufficient documentation

## 2019-07-29 DIAGNOSIS — Z7984 Long term (current) use of oral hypoglycemic drugs: Secondary | ICD-10-CM | POA: Insufficient documentation

## 2019-07-29 DIAGNOSIS — E119 Type 2 diabetes mellitus without complications: Secondary | ICD-10-CM | POA: Diagnosis not present

## 2019-07-29 DIAGNOSIS — C50912 Malignant neoplasm of unspecified site of left female breast: Secondary | ICD-10-CM

## 2019-07-29 DIAGNOSIS — Z170 Estrogen receptor positive status [ER+]: Secondary | ICD-10-CM

## 2019-07-29 DIAGNOSIS — E785 Hyperlipidemia, unspecified: Secondary | ICD-10-CM | POA: Diagnosis not present

## 2019-07-29 DIAGNOSIS — G8918 Other acute postprocedural pain: Secondary | ICD-10-CM | POA: Diagnosis not present

## 2019-07-29 HISTORY — PX: BREAST LUMPECTOMY WITH RADIOACTIVE SEED AND SENTINEL LYMPH NODE BIOPSY: SHX6550

## 2019-07-29 HISTORY — DX: Gastro-esophageal reflux disease without esophagitis: K21.9

## 2019-07-29 LAB — GLUCOSE, CAPILLARY
Glucose-Capillary: 107 mg/dL — ABNORMAL HIGH (ref 70–99)
Glucose-Capillary: 89 mg/dL (ref 70–99)

## 2019-07-29 SURGERY — BREAST LUMPECTOMY WITH RADIOACTIVE SEED AND SENTINEL LYMPH NODE BIOPSY
Anesthesia: General | Site: Breast | Laterality: Left

## 2019-07-29 MED ORDER — BUPIVACAINE-EPINEPHRINE (PF) 0.5% -1:200000 IJ SOLN
INTRAMUSCULAR | Status: AC
  Filled 2019-07-29: qty 30

## 2019-07-29 MED ORDER — CHLORHEXIDINE GLUCONATE CLOTH 2 % EX PADS: 6.0000 | MEDICATED_PAD | Freq: Once | CUTANEOUS | Status: DC

## 2019-07-29 MED ORDER — HYDROCODONE-ACETAMINOPHEN 5-325 MG PO TABS: 1.0000 | ORAL_TABLET | Freq: Four times a day (QID) | ORAL | 0 refills | Status: DC | PRN

## 2019-07-29 MED ORDER — MIDAZOLAM HCL 2 MG/2ML IJ SOLN
INTRAMUSCULAR | Status: AC
  Filled 2019-07-29: qty 2

## 2019-07-29 MED ORDER — ACETAMINOPHEN 500 MG PO TABS
1000.0000 mg | ORAL_TABLET | ORAL | Status: AC
  Administered 2019-07-29: 1000 mg via ORAL

## 2019-07-29 MED ORDER — CELECOXIB 200 MG PO CAPS
200.0000 mg | ORAL_CAPSULE | ORAL | Status: AC
  Administered 2019-07-29: 200 mg via ORAL

## 2019-07-29 MED ORDER — BUPIVACAINE HCL (PF) 0.25 % IJ SOLN
INTRAMUSCULAR | Status: AC
  Filled 2019-07-29: qty 30

## 2019-07-29 MED ORDER — MEPERIDINE HCL 25 MG/ML IJ SOLN: 6.2500 mg | INTRAMUSCULAR | Status: DC | PRN

## 2019-07-29 MED ORDER — FENTANYL CITRATE (PF) 100 MCG/2ML IJ SOLN
INTRAMUSCULAR | Status: AC
  Filled 2019-07-29: qty 2

## 2019-07-29 MED ORDER — SCOPOLAMINE 1 MG/3DAYS TD PT72: 1.0000 | MEDICATED_PATCH | Freq: Once | TRANSDERMAL | Status: DC

## 2019-07-29 MED ORDER — CEFAZOLIN SODIUM-DEXTROSE 2-4 GM/100ML-% IV SOLN
2.0000 g | INTRAVENOUS | Status: AC
  Administered 2019-07-29: 2 g via INTRAVENOUS

## 2019-07-29 MED ORDER — EPHEDRINE 5 MG/ML INJ
INTRAVENOUS | Status: AC
  Filled 2019-07-29: qty 10

## 2019-07-29 MED ORDER — PROPOFOL 10 MG/ML IV BOLUS
INTRAVENOUS | Status: DC | PRN
  Administered 2019-07-29: 150 mg via INTRAVENOUS

## 2019-07-29 MED ORDER — ACETAMINOPHEN 500 MG PO TABS
ORAL_TABLET | ORAL | Status: AC
  Filled 2019-07-29: qty 2

## 2019-07-29 MED ORDER — METOCLOPRAMIDE HCL 5 MG/ML IJ SOLN: 10.0000 mg | Freq: Once | INTRAMUSCULAR | Status: DC | PRN

## 2019-07-29 MED ORDER — ONDANSETRON HCL 4 MG/2ML IJ SOLN
INTRAMUSCULAR | Status: DC | PRN
  Administered 2019-07-29: 4 mg via INTRAVENOUS

## 2019-07-29 MED ORDER — FENTANYL CITRATE (PF) 100 MCG/2ML IJ SOLN: 25.0000 ug | INTRAMUSCULAR | Status: DC | PRN

## 2019-07-29 MED ORDER — PROPOFOL 10 MG/ML IV BOLUS
INTRAVENOUS | Status: AC
  Filled 2019-07-29: qty 20

## 2019-07-29 MED ORDER — CELECOXIB 200 MG PO CAPS
ORAL_CAPSULE | ORAL | Status: AC
  Filled 2019-07-29: qty 1

## 2019-07-29 MED ORDER — LACTATED RINGERS IV SOLN: INTRAVENOUS | Status: DC

## 2019-07-29 MED ORDER — LIDOCAINE HCL (CARDIAC) PF 100 MG/5ML IV SOSY
PREFILLED_SYRINGE | INTRAVENOUS | Status: DC | PRN
  Administered 2019-07-29: 80 mg via INTRAVENOUS

## 2019-07-29 MED ORDER — BUPIVACAINE LIPOSOME 1.3 % IJ SUSP
INTRAMUSCULAR | Status: DC | PRN
  Administered 2019-07-29: 10 mL

## 2019-07-29 MED ORDER — PHENYLEPHRINE 40 MCG/ML (10ML) SYRINGE FOR IV PUSH (FOR BLOOD PRESSURE SUPPORT)
PREFILLED_SYRINGE | INTRAVENOUS | Status: AC
  Filled 2019-07-29: qty 10

## 2019-07-29 MED ORDER — EPHEDRINE SULFATE 50 MG/ML IJ SOLN
INTRAMUSCULAR | Status: DC | PRN
  Administered 2019-07-29: 10 mg via INTRAVENOUS

## 2019-07-29 MED ORDER — LACTATED RINGERS IV SOLN
INTRAVENOUS | Status: DC
  Administered 2019-07-29 (×2): via INTRAVENOUS

## 2019-07-29 MED ORDER — TECHNETIUM TC 99M SULFUR COLLOID FILTERED
1.0000 | Freq: Once | INTRAVENOUS | Status: AC | PRN
  Administered 2019-07-29: 1 via INTRADERMAL

## 2019-07-29 MED ORDER — DEXAMETHASONE SODIUM PHOSPHATE 4 MG/ML IJ SOLN
INTRAMUSCULAR | Status: DC | PRN
  Administered 2019-07-29: 5 mg via INTRAVENOUS

## 2019-07-29 MED ORDER — SODIUM CHLORIDE (PF) 0.9 % IJ SOLN
INTRAMUSCULAR | Status: AC
  Filled 2019-07-29: qty 10

## 2019-07-29 MED ORDER — METHYLENE BLUE 0.5 % INJ SOLN
INTRAVENOUS | Status: AC
  Filled 2019-07-29: qty 10

## 2019-07-29 MED ORDER — BUPIVACAINE HCL (PF) 0.5 % IJ SOLN
INTRAMUSCULAR | Status: DC | PRN
  Administered 2019-07-29: 15 mL

## 2019-07-29 MED ORDER — MIDAZOLAM HCL 2 MG/2ML IJ SOLN
1.0000 mg | INTRAMUSCULAR | Status: DC | PRN
  Administered 2019-07-29 (×2): 2 mg via INTRAVENOUS

## 2019-07-29 MED ORDER — FENTANYL CITRATE (PF) 100 MCG/2ML IJ SOLN
50.0000 ug | INTRAMUSCULAR | Status: DC | PRN
  Administered 2019-07-29: 50 ug via INTRAVENOUS
  Administered 2019-07-29: 100 ug via INTRAVENOUS

## 2019-07-29 MED ORDER — CEFAZOLIN SODIUM-DEXTROSE 2-4 GM/100ML-% IV SOLN
INTRAVENOUS | Status: AC
  Filled 2019-07-29: qty 100

## 2019-07-29 SURGICAL SUPPLY — 54 items
BENZOIN TINCTURE PRP APPL 2/3 (GAUZE/BANDAGES/DRESSINGS) IMPLANT
BINDER BREAST LRG (GAUZE/BANDAGES/DRESSINGS) IMPLANT
BINDER BREAST MEDIUM (GAUZE/BANDAGES/DRESSINGS) IMPLANT
BINDER BREAST XLRG (GAUZE/BANDAGES/DRESSINGS) IMPLANT
BINDER BREAST XXLRG (GAUZE/BANDAGES/DRESSINGS) ×1 IMPLANT
BLADE SURG 15 STRL LF DISP TIS (BLADE) ×1 IMPLANT
BLADE SURG 15 STRL SS (BLADE) ×1
CANISTER SUC SOCK COL 7IN (MISCELLANEOUS) IMPLANT
CANISTER SUCT 1200ML W/VALVE (MISCELLANEOUS) ×2 IMPLANT
CHLORAPREP W/TINT 26 (MISCELLANEOUS) ×2 IMPLANT
CLIP VESOCCLUDE SM WIDE 6/CT (CLIP) ×2 IMPLANT
COVER BACK TABLE REUSABLE LG (DRAPES) ×2 IMPLANT
COVER MAYO STAND REUSABLE (DRAPES) ×2 IMPLANT
COVER PROBE W GEL 5X96 (DRAPES) ×2 IMPLANT
COVER WAND RF STERILE (DRAPES) IMPLANT
DECANTER SPIKE VIAL GLASS SM (MISCELLANEOUS) IMPLANT
DERMABOND ADVANCED (GAUZE/BANDAGES/DRESSINGS) ×1
DERMABOND ADVANCED .7 DNX12 (GAUZE/BANDAGES/DRESSINGS) ×1 IMPLANT
DRAPE HALF SHEET 70X43 (DRAPES) ×2 IMPLANT
DRAPE LAPAROSCOPIC ABDOMINAL (DRAPES) ×2 IMPLANT
DRAPE UTILITY XL STRL (DRAPES) ×2 IMPLANT
DRSG PAD ABDOMINAL 8X10 ST (GAUZE/BANDAGES/DRESSINGS) ×1 IMPLANT
ELECT COATED BLADE 2.86 ST (ELECTRODE) ×2 IMPLANT
ELECT REM PT RETURN 9FT ADLT (ELECTROSURGICAL) ×2
ELECTRODE REM PT RTRN 9FT ADLT (ELECTROSURGICAL) ×1 IMPLANT
GAUZE SPONGE 4X4 12PLY STRL (GAUZE/BANDAGES/DRESSINGS) ×2 IMPLANT
GLOVE BIOGEL PI IND STRL 7.0 (GLOVE) IMPLANT
GLOVE BIOGEL PI INDICATOR 7.0 (GLOVE) ×2
GLOVE SURG SS PI 7.0 STRL IVOR (GLOVE) ×1 IMPLANT
GLOVE SURG SYN 7.5  E (GLOVE) ×3
GLOVE SURG SYN 7.5 E (GLOVE) ×3 IMPLANT
GLOVE SURG SYN 7.5 PF PI (GLOVE) ×2 IMPLANT
GOWN STRL REUS W/ TWL LRG LVL3 (GOWN DISPOSABLE) ×1 IMPLANT
GOWN STRL REUS W/ TWL XL LVL3 (GOWN DISPOSABLE) ×1 IMPLANT
GOWN STRL REUS W/TWL LRG LVL3 (GOWN DISPOSABLE) ×1
GOWN STRL REUS W/TWL XL LVL3 (GOWN DISPOSABLE) ×2
KIT MARKER MARGIN INK (KITS) ×2 IMPLANT
NDL HYPO 25X1 1.5 SAFETY (NEEDLE) ×1 IMPLANT
NDL SAFETY ECLIPSE 18X1.5 (NEEDLE) IMPLANT
NEEDLE HYPO 18GX1.5 SHARP (NEEDLE)
NEEDLE HYPO 25X1 1.5 SAFETY (NEEDLE) ×2 IMPLANT
NS IRRIG 1000ML POUR BTL (IV SOLUTION) ×2 IMPLANT
PACK BASIN DAY SURGERY FS (CUSTOM PROCEDURE TRAY) ×2 IMPLANT
PENCIL BUTTON HOLSTER BLD 10FT (ELECTRODE) ×2 IMPLANT
SLEEVE SCD COMPRESS KNEE MED (MISCELLANEOUS) ×2 IMPLANT
SPONGE LAP 18X18 RF (DISPOSABLE) ×2 IMPLANT
STRIP CLOSURE SKIN 1/2X4 (GAUZE/BANDAGES/DRESSINGS) IMPLANT
SUT MNCRL AB 4-0 PS2 18 (SUTURE) ×3 IMPLANT
SUT VICRYL 3-0 CR8 SH (SUTURE) ×3 IMPLANT
SYR CONTROL 10ML LL (SYRINGE) ×2 IMPLANT
TOWEL GREEN STERILE FF (TOWEL DISPOSABLE) ×2 IMPLANT
TRAY FAXITRON CT DISP (TRAY / TRAY PROCEDURE) ×2 IMPLANT
TUBE CONNECTING 20X1/4 (TUBING) ×2 IMPLANT
YANKAUER SUCT BULB TIP NO VENT (SUCTIONS) ×2 IMPLANT

## 2019-07-29 NOTE — Anesthesia Procedure Notes (Signed)
Procedure Name: LMA Insertion Performed by: Verita Lamb, CRNA Pre-anesthesia Checklist: Emergency Drugs available, Patient identified, Suction available, Patient being monitored and Timeout performed Patient Re-evaluated:Patient Re-evaluated prior to induction Oxygen Delivery Method: Circle system utilized Preoxygenation: Pre-oxygenation with 100% oxygen Induction Type: IV induction LMA: LMA inserted LMA Size: 4.0 Tube type: Oral Number of attempts: 1 Placement Confirmation: positive ETCO2,  breath sounds checked- equal and bilateral and CO2 detector Tube secured with: Tape Dental Injury: Teeth and Oropharynx as per pre-operative assessment

## 2019-07-29 NOTE — Progress Notes (Signed)
Assisted Dr. Carignan with left, ultrasound guided, pectoralis block. Side rails up, monitors on throughout procedure. See vital signs in flow sheet. Tolerated Procedure well. 

## 2019-07-29 NOTE — Anesthesia Procedure Notes (Signed)
Anesthesia Regional Block: Pectoralis block   Pre-Anesthetic Checklist: ,, timeout performed, Correct Patient, Correct Site, Correct Laterality, Correct Procedure, Correct Position, site marked, Risks and benefits discussed,  Surgical consent,  Pre-op evaluation,  At surgeon's request and post-op pain management  Laterality: Left  Prep: Maximum Sterile Barrier Precautions used, chloraprep       Needles:  Injection technique: Single-shot  Needle Type: Echogenic Stimulator Needle     Needle Length: 10cm      Additional Needles:   Procedures:,,,, ultrasound used (permanent image in chart),,,,  Narrative:  Start time: 07/29/2019 11:44 AM End time: 07/29/2019 11:54 AM Injection made incrementally with aspirations every 5 mL.  Performed by: Personally  Anesthesiologist: Montez Hageman, MD  Additional Notes: Risks, benefits and alternative to block explained extensively.  Patient tolerated procedure well, without complications.

## 2019-07-29 NOTE — Progress Notes (Signed)
nuc med inj performed by nuc med staff. Pt tol well with no additional sedation. VSS. Emotional support provided

## 2019-07-29 NOTE — Transfer of Care (Signed)
Immediate Anesthesia Transfer of Care Note  Patient: Kathryn Webb  Procedure(s) Performed: LEFT BREAST LUMPECTOMY WITH RADIOACTIVE SEED AND LEFT AXILLARY SENTINEL LYMPH NODE BIOPSY (Left Breast)  Patient Location: PACU  Anesthesia Type:GA combined with regional for post-op pain  Level of Consciousness: sedated  Airway & Oxygen Therapy: Patient Spontanous Breathing and Patient connected to nasal cannula oxygen  Post-op Assessment: Report given to RN and Post -op Vital signs reviewed and stable  Post vital signs: Reviewed and stable  Last Vitals:  Vitals Value Taken Time  BP    Temp    Pulse    Resp    SpO2      Last Pain:  Vitals:   07/29/19 1017  TempSrc: Oral  PainSc: 0-No pain      Patients Stated Pain Goal: 3 ([id number])  Complications: No apparent anesthesia complications

## 2019-07-29 NOTE — Interval H&P Note (Signed)
History and Physical Interval Note:  07/29/2019 1:13 PM  Kathryn Webb  has presented today for surgery, with the diagnosis of LEFT BREAST CANCER.  The various methods of treatment have been discussed with the patient and family.   Her mother is here with her.  After consideration of risks, benefits and other options for treatment, the patient has consented to  Procedure(s): LEFT BREAST LUMPECTOMY WITH RADIOACTIVE SEED AND LEFT AXILLARY SENTINEL LYMPH NODE BIOPSY (Left) as a surgical intervention.  The patient's history has been reviewed, patient examined, no change in status, stable for surgery.  I have reviewed the patient's chart and labs.  Questions were answered to the patient's satisfaction.     Shann Medal

## 2019-07-29 NOTE — Op Note (Addendum)
07/29/2019  2:43 PM  PATIENT:  Kathryn Webb DOB: Sep 05, 1961 MRN: [id number]  PREOP DIAGNOSIS:   LEFT BREAST CANCER  POSTOP DIAGNOSIS:    Left breast cancer, 10 o'clock position (T1, N0)  PROCEDURE:   Procedure(s): LEFT BREAST LUMPECTOMY WITH RADIOACTIVE SEED AND LEFT AXILLARY SENTINEL LYMPH NODE BIOPSY, deep sentinel lymph node biopsy  SURGEON:   Alphonsa Overall, M.D.  ANESTHESIA:   General  Anesthesiologist: Montez Hageman, MD CRNA: Maryella Shivers, CRNA; Verita Lamb, CRNA  General  EBL:  75  ml  DRAINS:  none   LOCAL MEDICATIONS USED:   Left pectoralis block by anesthesia  SPECIMEN:   Left breast lumpectomy (6 color), left axillary sentinel lymph node (counts 140, background 10)  COUNTS CORRECT:  YES  INDICATIONS FOR PROCEDURE:  Kathryn Webb is a 58 y.o. (DOB: [birth year]) AA female whose primary care physician is Redmon, Noelle, PA-C and comes for left breast lumpectomy and left axillary sentinel lymph node biopsy.   She [facility] with Drs. Gudena and Kinard.  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   The options for breast cancer treatment have been discussed with the patient. She elected to proceed with lumpectomy and axillary sentinel lymph node.     The indications and potential complications of surgery were explained to the patient. Potential complications include, but are not limited to, bleeding, infection, the need for further surgery, and nerve injury.     She had a I131 seed placed in her left breast at [facility].  The seed is in the 10 o'clock position of the left breast.   In the holding area, her left areola was injected with 1 millicurie of Technitium Sulfur Colloid.  OPERATIVE NOTE:   The patient was taken to operating room # 6 at [facility] where she underwent a general anesthesia  supervised by Anesthesiologist: Montez Hageman, MD CRNA: Maryella Shivers, CRNA; Verita Lamb, CRNA. Her left breast  and axilla were prepped with  ChloraPrep and sterilely draped.    A time-out and the surgical check list was reviewed.    The cancer was about at the 10 o'clock position of the left breast.   It was 2 cm from the areola.  I used the Neoprobe to identify the I131 seed.  I was able to make an upper inner circumareolar incision to get the mass out.    I tried to excise an area around the tumor of at least 1 cm.    I excised this block of breast tissue approximately 4 cm by 3 cm  in diameter.   I painted the lumpectomy specimen with the 6 color paint kit and did a specimen mammogram which confirmed the mass, clip, and the seed were all in the right position in the specimen.  The specimen was sent to pathology who called back to confirm that they have the seed and the specimen.   I then started the left deep axillary sentinel lymph node biopsy. I made an incision in the left axilla.  She had had a prior excision of the axillary skin for hidradenitis, so the lymph node seemed more superficial than normal.   I found a hot area at the junction of the breast and the pectoralis major muscle, deep in the axilla. I cut down and  identified a hot node that had counts of 120 and the background has 10 counts.  I checked her internal mammary nodes and supraclavicular nodes with the neoprobe and found no other hot area. The axillary node was then sent to pathology.    I then irrigated the wounds with saline.  I placed 4 clips to mark biopsy cavity, at 12, 3, 6, and 9 o'clock.  I then closed all the wounds in layers using 3-0 Vicryl sutures for the deep layer. At the skin, I closed the incisions with a 4-0 Monocryl suture. The incisions were then painted with Dermabond.  She had gauze place over the wounds and placed in a breast binder.   The patient tolerated the procedure well, was transported to the recovery room in good condition. Sponge and needle count were correct at the end of the case.   Final pathology is  pending.     Left breast lumpectomy specimen  Alphonsa Overall, MD, [facility] Pager: [phone] Office phone:  [phone]

## 2019-07-29 NOTE — Anesthesia Preprocedure Evaluation (Signed)
Anesthesia Evaluation  Patient identified by MRN, date of birth, ID band Patient awake    Reviewed: Allergy & Precautions, NPO status , Patient's Chart, lab work & pertinent test results  Airway Mallampati: II  TM Distance: >3 FB Neck ROM: Full    Dental no notable dental hx.    Pulmonary neg pulmonary ROS, former smoker,    Pulmonary exam normal breath sounds clear to auscultation       Cardiovascular negative cardio ROS Normal cardiovascular exam Rhythm:Regular Rate:Normal     Neuro/Psych negative neurological ROS  negative psych ROS   GI/Hepatic Neg liver ROS, GERD  Controlled,  Endo/Other  diabetes, Type 2  Renal/GU negative Renal ROS  negative genitourinary   Musculoskeletal negative musculoskeletal ROS (+)   Abdominal   Peds negative pediatric ROS (+)  Hematology negative hematology ROS (+)   Anesthesia Other Findings   Reproductive/Obstetrics negative OB ROS                             Anesthesia Physical Anesthesia Plan  ASA: II  Anesthesia Plan: General   Post-op Pain Management:  Regional for Post-op pain   Induction: Intravenous  PONV Risk Score and Plan: 3 and Ondansetron, Dexamethasone, Midazolam and Treatment may vary due to age or medical condition  Airway Management Planned: LMA  Additional Equipment:   Intra-op Plan:   Post-operative Plan: Extubation in OR  Informed Consent: I have reviewed the patients History and Physical, chart, labs and discussed the procedure including the risks, benefits and alternatives for the proposed anesthesia with the patient or authorized representative who has indicated his/her understanding and acceptance.     Dental advisory given  Plan Discussed with: CRNA  Anesthesia Plan Comments:         Anesthesia Quick Evaluation

## 2019-07-29 NOTE — Discharge Instructions (Signed)
[facility] - DISCHARGE INSTRUCTIONS TO PATIENT   Activity:  Driving - May drive in 2 to 4 days, if doing well and off pain meds   Lifting - No lifting more than 15 pounds for 5 days, then no limits                       Practice you Covid-19 protection:  Wear a mask, social distance, and wash your hands frequently  Wound Care:   Leave the incision dry for 2 days, then you may shower  Diet:  As tolerated  Follow up appointment:  Call Dr. Pollie Friar office [facility]) at [phone] for an appointment in 2 to 3 weeks.  Medications and dosages:  Resume your home medications.  You have a prescription for:  Vicodin  Next time for Tylenol: 4PM  Call Dr. [name] or his office  [phone]) if you have:  Temperature greater than 100.4,  Persistent nausea and vomiting,  Severe uncontrolled pain,  Redness, tenderness, or signs of infection (pain, swelling, redness, odor or green/yellow discharge around the site),  Difficulty breathing, headache or visual disturbances,  Any other questions or concerns you may have after discharge.  In an emergency, call 911 or go to an Emergency Department at a nearby hospital.   Post Anesthesia Home Care Instructions  Activity: Get plenty of rest for the remainder of the day. A responsible individual must stay with you for 24 hours following the procedure.  For the next 24 hours, DO NOT: -Drive a car -Paediatric nurse -Drink alcoholic beverages -Take any medication unless instructed by your physician -Make any legal decisions or sign important papers.  Meals: Start with liquid foods such as gelatin or soup. Progress to regular foods as tolerated. Avoid greasy, spicy, heavy foods. If nausea and/or vomiting occur, drink only clear liquids until the nausea and/or vomiting subsides. Call your physician if vomiting continues.  Special Instructions/Symptoms: Your throat may feel dry or sore from the anesthesia or the  breathing tube placed in your throat during surgery. If this causes discomfort, gargle with warm salt water. The discomfort should disappear within 24 hours.  If you had a scopolamine patch placed behind your ear for the management of post- operative nausea and/or vomiting:  1. The medication in the patch is effective for 72 hours, after which it should be removed.  Wrap patch in a tissue and discard in the trash. Wash hands thoroughly with soap and water. 2. You may remove the patch earlier than 72 hours if you experience unpleasant side effects which may include dry mouth, dizziness or visual disturbances. 3. Avoid touching the patch. Wash your hands with soap and water after contact with the patch.    Information for Discharge Teaching: EXPAREL (bupivacaine liposome injectable suspension)   Your surgeon or anesthesiologist gave you EXPAREL(bupivacaine) to help control your pain after surgery.   EXPAREL is a local anesthetic that provides pain relief by numbing the tissue around the surgical site.  EXPAREL is designed to release pain medication over time and can control pain for up to 72 hours.  Depending on how you respond to EXPAREL, you may require less pain medication during your recovery.  Possible side effects:  Temporary loss of sensation or ability to move in the area where bupivacaine was injected.  Nausea, vomiting, constipation  Rarely, numbness and tingling in your mouth or lips, lightheadedness, or anxiety may occur.  Call your doctor right away if you think  you may be experiencing any of these sensations, or if you have other questions regarding possible side effects.  Follow all other discharge instructions given to you by your surgeon or nurse. Eat a healthy diet and drink plenty of water or other fluids.  If you return to the hospital for any reason within 96 hours following the administration of EXPAREL, it is important for health care providers to know that you  have received this anesthetic. A teal colored band has been placed on your arm with the date, time and amount of EXPAREL you have received in order to alert and inform your health care providers. Please leave this armband in place for the full 96 hours following administration, and then you may remove the band.  Regional Anesthesia Blocks  1. Numbness or the inability to move the "blocked" extremity may last from 3-48 hours after placement. The length of time depends on the medication injected and your individual response to the medication. If the numbness is not going away after 48 hours, call your surgeon.  2. The extremity that is blocked will need to be protected until the numbness is gone and the  Strength has returned. Because you cannot feel it, you will need to take extra care to avoid injury. Because it may be weak, you may have difficulty moving it or using it. You may not know what position it is in without looking at it while the block is in effect.  3. For blocks in the legs and feet, returning to weight bearing and walking needs to be done carefully. You will need to wait until the numbness is entirely gone and the strength has returned. You should be able to move your leg and foot normally before you try and bear weight or walk. You will need someone to be with you when you first try to ensure you do not fall and possibly risk injury.  4. Bruising and tenderness at the needle site are common side effects and will resolve in a few days.  5. Persistent numbness or new problems with movement should be communicated to the surgeon or the [facility] [phone] [place] [phone]).

## 2019-07-29 NOTE — Progress Notes (Signed)
Surgeon at bedside.  

## 2019-07-30 ENCOUNTER — Encounter (HOSPITAL_BASED_OUTPATIENT_CLINIC_OR_DEPARTMENT_OTHER): Payer: Self-pay | Admitting: Surgery

## 2019-07-30 NOTE — Anesthesia Postprocedure Evaluation (Signed)
Anesthesia Post Note  Patient: Kathryn Webb  Procedure(s) Performed: LEFT BREAST LUMPECTOMY WITH RADIOACTIVE SEED AND LEFT AXILLARY SENTINEL LYMPH NODE BIOPSY (Left Breast)     Patient location during evaluation: PACU Anesthesia Type: General Level of consciousness: awake and alert Pain management: pain level controlled Vital Signs Assessment: post-procedure vital signs reviewed and stable Respiratory status: spontaneous breathing, nonlabored ventilation, respiratory function stable and patient connected to nasal cannula oxygen Cardiovascular status: blood pressure returned to baseline and stable Postop Assessment: no apparent nausea or vomiting Anesthetic complications: no    Last Vitals:  Vitals:   07/29/19 1515 07/29/19 1538  BP: (!) 144/71 135/71  Pulse: 65 62  Resp: 15 18  Temp:  36.6 C  SpO2: 100% 100%    Last Pain:  Vitals:   07/29/19 1538  TempSrc:   PainSc: 2                  Montez Hageman

## 2019-08-01 ENCOUNTER — Telehealth: Payer: Self-pay | Admitting: *Deleted

## 2019-08-01 NOTE — Telephone Encounter (Signed)
Ordered oncotype per Dr. Gudena. Faxed requisition to pathology. °

## 2019-08-05 IMAGING — DX DG CHEST 2V
2 series · 2 of 2 positions shown · non-contrast
Comparison: None.

CLINICAL DATA: Hypercalcemia.

EXAM:
CHEST  2 VIEW

[dg chest 2 view (1 of 2)]
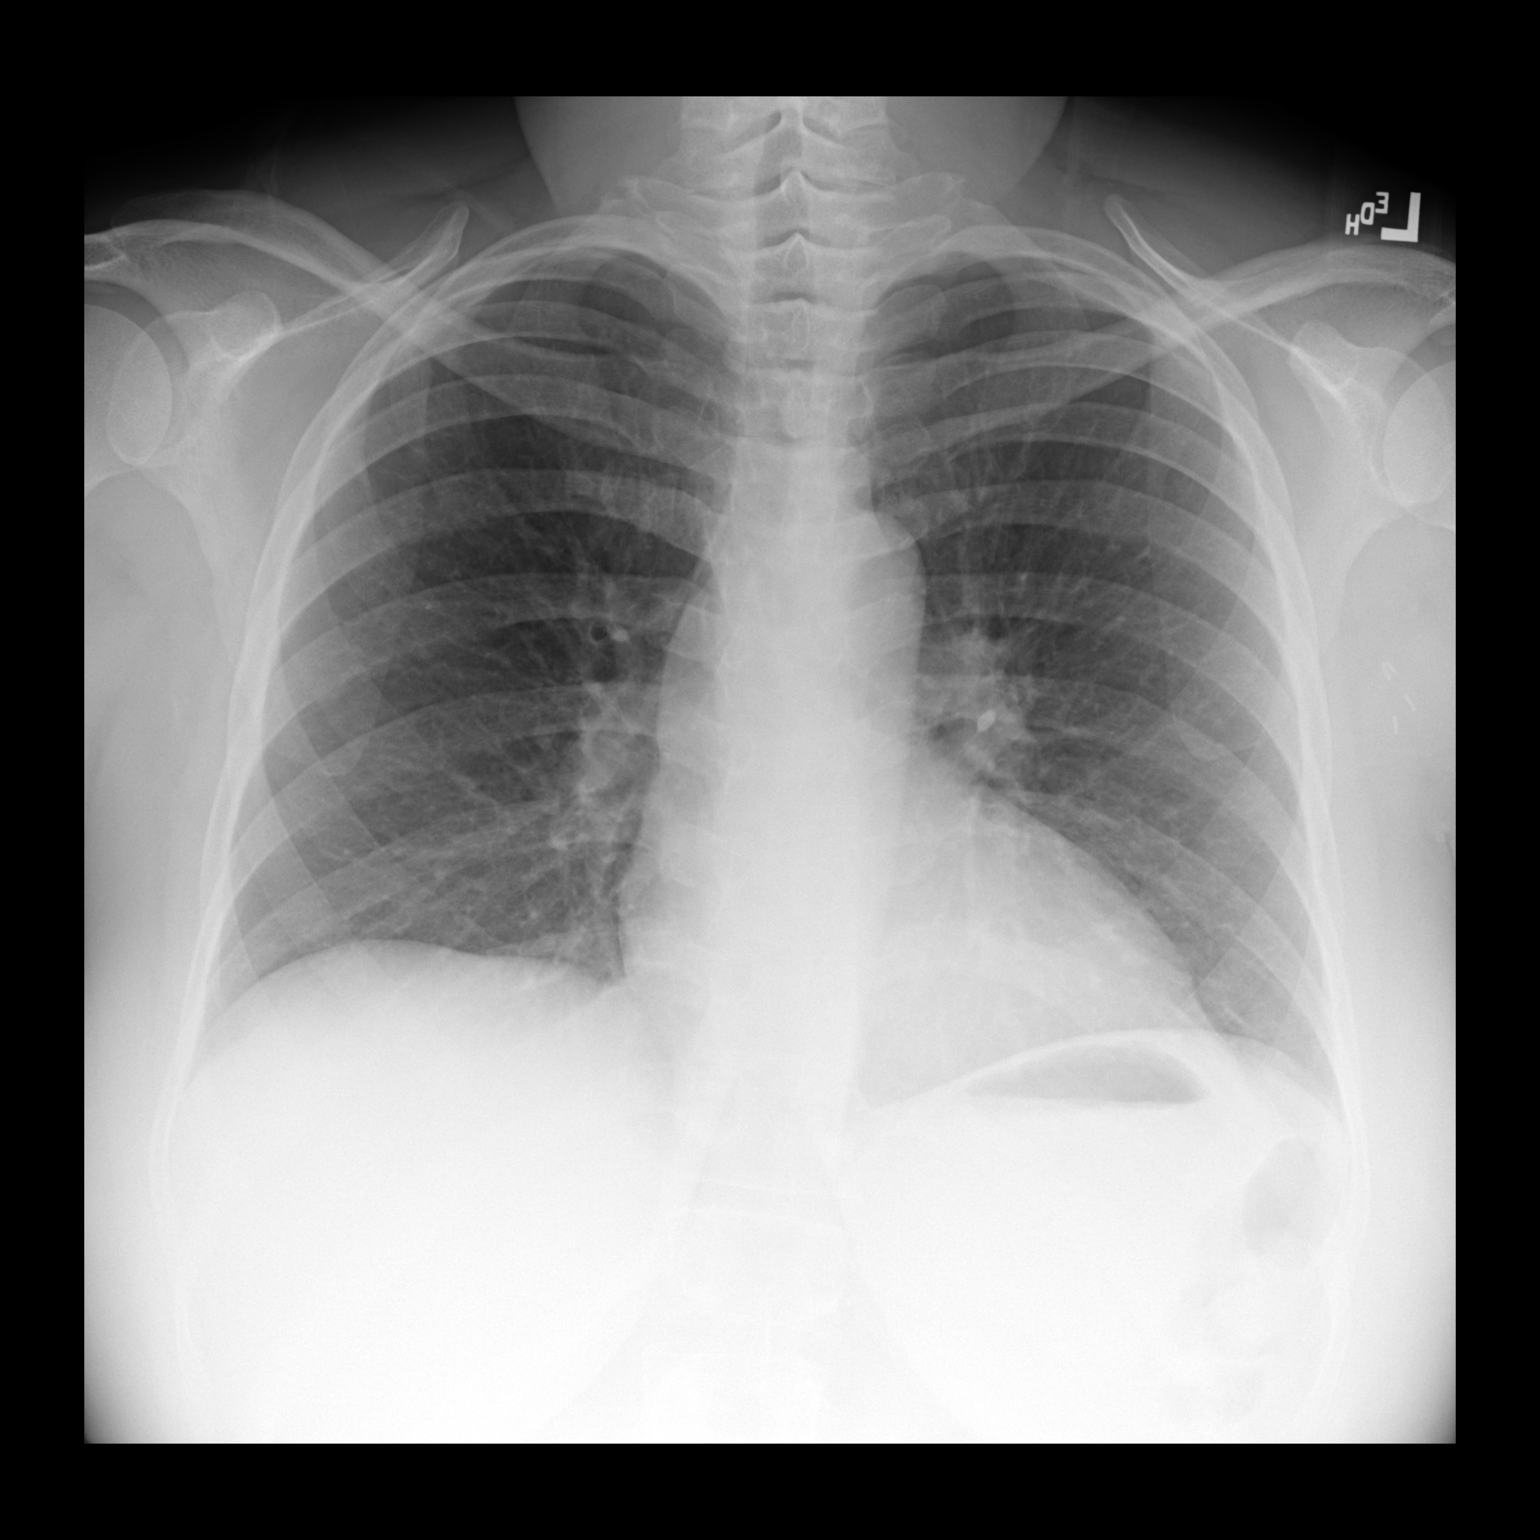

[dg chest 2 view (2 of 2)]
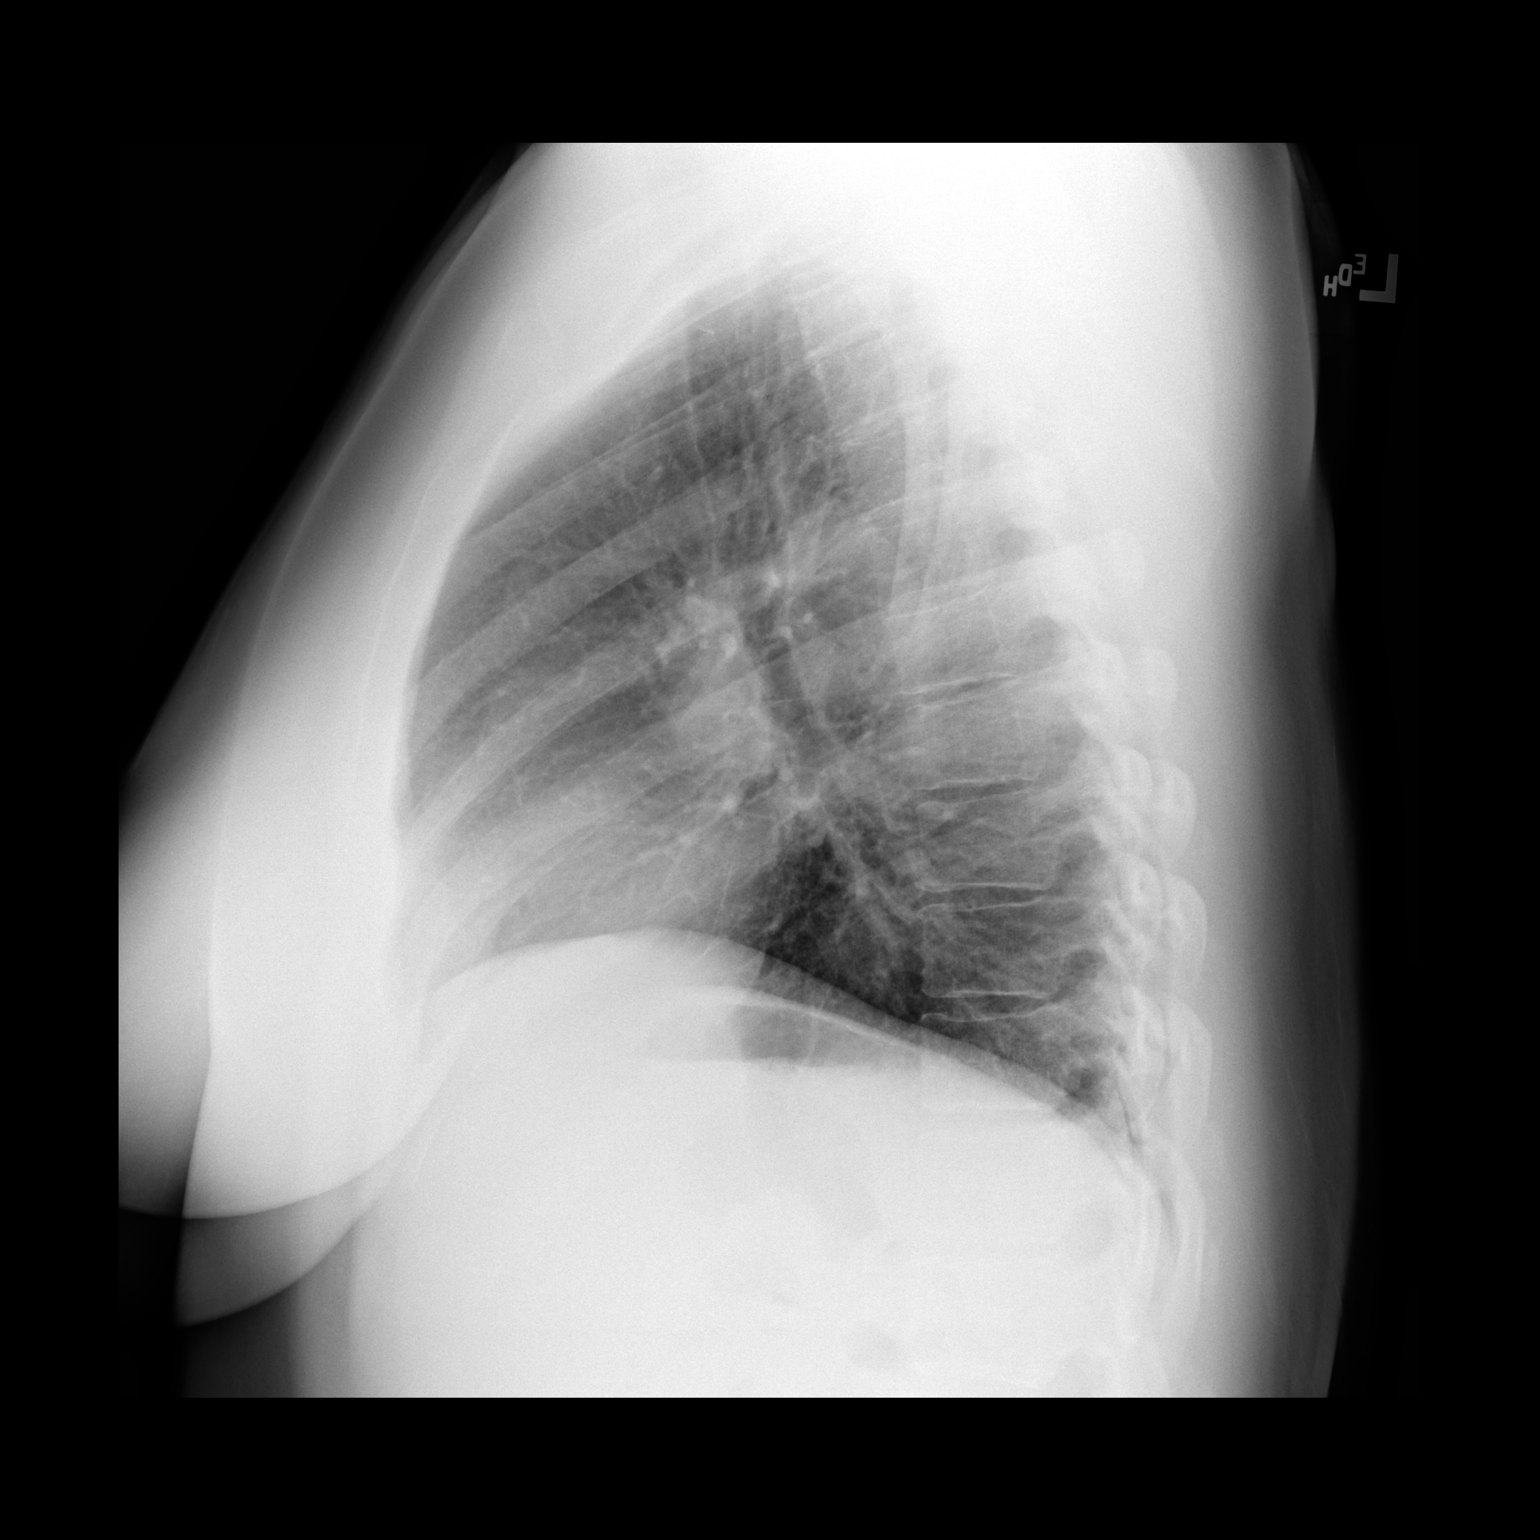

[2 of 2 positions shown; findings below may reference images not displayed]

FINDINGS: The heart size and mediastinal contours are within normal limits.
Both lungs are clear. The visualized skeletal structures are
unremarkable. No pleural effusions. Surgical clips in left axilla.
IMPRESSION: No active cardiopulmonary disease.

## 2019-08-06 NOTE — Progress Notes (Signed)
Patient Care Team: Lennie Odor, PA-C as PCP - General (Nurse Practitioner) Rockwell Germany, RN as Oncology Nurse Navigator Mauro Kaufmann, RN as Oncology Nurse Navigator Alphonsa Overall, MD as Consulting Physician (General Surgery) Nicholas Lose, MD as Consulting Physician (Hematology and Oncology) Gery Pray, MD as Consulting Physician (Radiation Oncology)  DIAGNOSIS:    ICD-10-CM   1. Malignant neoplasm of upper-inner quadrant of left breast in female, estrogen receptor positive ([place])  C50.212    Z17.0     SUMMARY OF ONCOLOGIC HISTORY: Oncology History  Malignant neoplasm of upper-inner quadrant of left breast in female, estrogen receptor positive ([place])  07/04/2019 Initial Diagnosis   Routine screening detected an irregular, indeterminate architectural distortion in the left breast. US showed a 1.0cm mass at the 9 o'clock position. Biopsy showed IDC, grade 2, HER-2 - (1+), ER+ 100%, PR+ 100%, Ki67 15%.    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      CHIEF COMPLIANT: Follow-up s/p lumpectomy to review pathology  INTERVAL HISTORY: Kathryn Webb is a 58 y.o. with above-mentioned history of invasive ductal carcinoma of the left breast. She underwent a lumpectomy on 07/29/19 with Dr. [name] for which pathology confirmed invasive ductal carcinoma with DCIS, grade 2, 1.5cm, clear margins, and one lymph node negative for carcinoma. She presents to the clinic today to review the pathology report and discuss further treatment.  She has done extremely well with the surgery.  Denies any pain or discomfort.  She feels some swelling and hardness at the site of surgery.  Denies any fevers or chills denies any pain.   REVIEW OF SYSTEMS:   Constitutional: Denies fevers, chills or abnormal weight loss Eyes: Denies blurriness of vision Ears, nose, mouth, throat, and face: Denies mucositis or sore throat Respiratory: Denies cough, dyspnea or wheezes Cardiovascular: Denies palpitation, chest discomfort Gastrointestinal: Denies nausea, heartburn or change in bowel habits Skin: Denies abnormal skin rashes Lymphatics: Denies new lymphadenopathy or easy bruising Neurological: Denies numbness, tingling or new weaknesses Behavioral/Psych: Mood is stable, no new changes  Extremities: No lower extremity edema Breast: s/p left lumpectomy patient is feeling some hardness in the area of the surgery All other systems were reviewed with the patient and are negative.  I have reviewed the past medical history, past surgical history, social history and family history with the patient and they are unchanged from previous note.  ALLERGIES:  has No Known Allergies.  MEDICATIONS:  Current Outpatient Medications  Medication Sig Dispense Refill  . atorvastatin (LIPITOR) 20 MG tablet Take 20 mg by mouth daily.    . Blood Glucose Monitoring Suppl (CONTOUR NEXT EZ MONITOR) W/DEVICE KIT 1 Act by Does not apply route 2 (two) times daily. 2 kit 0  . empagliflozin (JARDIANCE) 10 MG TABS tablet Take 10 mg by mouth daily.    . ergocalciferol (VITAMIN D2) 1.25 MG (50000 UT) capsule Take 50,000 Units by mouth once a week.    Marland Kitchen glucose blood (BAYER CONTOUR NEXT TEST) test strip Use BID 100 each 12  . HYDROcodone-acetaminophen (NORCO/VICODIN) 5-325 MG tablet Take 1 tablet by mouth every 6 (six) hours as needed for moderate pain. 15 tablet 0  . losartan (COZAAR) 25 MG tablet  Take 25 mg by mouth daily.    . metFORMIN (GLUCOPHAGE) 500 MG tablet TAKE 1 TABLET (500 MG TOTAL) BY MOUTH 2 (TWO) TIMES DAILY WITH A MEAL. (Patient taking differently: 1,000 mg 2 (two) times daily with a meal. ) 60 tablet 2  . Varenicline Tartrate (CHANTIX PO) Take by  mouth 2 (two) times daily.     No current facility-administered medications for this visit.     PHYSICAL EXAMINATION: ECOG PERFORMANCE STATUS: 1 - Symptomatic but completely ambulatory  Vitals:   08/07/19 0847  BP: (!) 143/69  Pulse: (!) 59  Resp: 18  Temp: 97.9 F (36.6 C)  SpO2: 100%   Filed Weights   08/07/19 0847  Weight: 222 lb 14.4 oz (101.1 kg)    GENERAL: alert, no distress and comfortable SKIN: skin color, texture, turgor are normal, no rashes or significant lesions EYES: normal, Conjunctiva are pink and non-injected, sclera clear OROPHARYNX: no exudate, no erythema and lips, buccal mucosa, and tongue normal  NECK: supple, thyroid normal size, non-tender, without nodularity LYMPH: no palpable lymphadenopathy in the cervical, axillary or inguinal LUNGS: clear to auscultation and percussion with normal breathing effort HEART: regular rate & rhythm and no murmurs and no lower extremity edema ABDOMEN: abdomen soft, non-tender and normal bowel sounds MUSCULOSKELETAL: no cyanosis of digits and no clubbing  NEURO: alert & oriented x 3 with fluent speech, no focal motor/sensory deficits EXTREMITIES: No lower extremity edema  LABORATORY DATA:  I have reviewed the data as listed CMP Latest Ref Rng & Units 07/09/2019 01/23/2017 03/11/2014  Glucose 70 - 99 mg/dL 114(H) 130(H) 110(H)  BUN 6 - 20 mg/dL 13 8 12   Creatinine 0.44 - 1.00 mg/dL 0.75 0.56 0.6  Sodium 135 - 145 mmol/L 139 137 139  Potassium 3.5 - 5.1 mmol/L 4.3 4.6 4.7  Chloride 98 - 111 mmol/L 103 104 103  CO2 22 - 32 mmol/L 26 25 29   Calcium 8.9 - 10.3 mg/dL 11.4(H) 11.8(H) 11.7(H)  Total Protein 6.5 - 8.1 g/dL 8.1 - 8.6(H)  Total Bilirubin 0.3 - 1.2 mg/dL 0.4 - 0.5  Alkaline Phos 38 - 126 U/L 118 - 112  AST 15 - 41 U/L 17 - 22  ALT 0 - 44 U/L 18 - 20    Lab Results  Component Value Date   WBC 5.4 07/09/2019   HGB 12.6 07/09/2019   HCT 39.5 07/09/2019   MCV 84.4 07/09/2019   PLT 238 07/09/2019    NEUTROABS 2.4 07/09/2019    ASSESSMENT & PLAN:  Malignant neoplasm of upper-inner quadrant of left breast in female, estrogen receptor positive ([place]) 07/04/2019:Routine screening detected an irregular, indeterminate architectural distortion in the left breast. US showed a 1.0cm mass at the 9 o'clock position. Biopsy showed IDC, grade 2, HER-2 - (1+), ER+ 100%, PR+ 100%, Ki67 15%.  T1cN0 stage Ia clinical stage  07/29/2019:Left lumpectomy [name]): IDC with DCIS, grade 2, 1.5cm, clear margins, and one lymph node negative.  ER 100%, PR 100%, Ki-67 15%, HER-2 negative T1CN0 stage Ia  Pathology counseling: I discussed the final pathology report of the patient provided  a copy of this report. I discussed the margins as well as lymph node surgeries. We also discussed the final staging along with previously performed ER/PR and HER-2/neu testing.  Treatment plan: 1. Oncotype DX testing to determine if chemotherapy would be of any benefit followed by 2. Adjuvant radiation therapy followed by 3. Adjuvant antiestrogen therapy  Patient was inquiring about her goal would risk.  Based upon the risk calculator she is considered to be low risk Return to clinic based upon Oncotype test result    No orders of the defined types were placed in this encounter.  The patient has a good understanding of the overall plan. she agrees with it. she will call with any problems that may develop before the next visit here.  Nicholas Lose, MD 08/07/2019  Julious Oka Dorshimer am acting as scribe for Dr. Nicholas Lose.  I have reviewed the above documentation for accuracy and completeness, and I agree with the above.

## 2019-08-06 NOTE — Progress Notes (Signed)
[facility] Psychosocial Distress Screening Clinical Social Work  Clinical Social Work was referred by distress screening protocol.  The patient scored a 6 on the Psychosocial Distress Thermometer which indicates moderate distress. Clinical Social Worker contacted patient by phone to assess for distress and other psychosocial needs.   ONCBCN DISTRESS SCREENING 08/06/2019  Distress experienced in past week (1-10) 6  Emotional problem type Nervousness/Anxiety;Adjusting to illness  Information Concerns Type Lack of info about diagnosis;Lack of info about treatment  Physical Problem type Sleep/insomnia  Referral to support programs Yes    Clinical Social Worker follow up needed: No.  Counseling Intern Notes: I spoke with Delane Ginger by phone on 08/06/2019.  She was upbeat and reported that she is doing "just fine" at the moment and that has a positive outlook.  The patient stated she feels lucky because her cancer is easily treatable. She stated that she has emotional support through family, church, coworkers and friends.  The patient declined the offer for one-on-one counseling or other support services at this time. I will check-in with the patient in 3-4 weeks.    [place] Counseling Intern  Voicemail: [phone]

## 2019-08-07 ENCOUNTER — Other Ambulatory Visit: Payer: Self-pay

## 2019-08-07 ENCOUNTER — Inpatient Hospital Stay: Payer: BC Managed Care – PPO | Attending: Hematology and Oncology | Admitting: Hematology and Oncology

## 2019-08-07 DIAGNOSIS — Z7984 Long term (current) use of oral hypoglycemic drugs: Secondary | ICD-10-CM | POA: Diagnosis not present

## 2019-08-07 DIAGNOSIS — Z170 Estrogen receptor positive status [ER+]: Secondary | ICD-10-CM

## 2019-08-07 DIAGNOSIS — C50212 Malignant neoplasm of upper-inner quadrant of left female breast: Secondary | ICD-10-CM | POA: Insufficient documentation

## 2019-08-07 DIAGNOSIS — Z79899 Other long term (current) drug therapy: Secondary | ICD-10-CM | POA: Insufficient documentation

## 2019-08-07 NOTE — Assessment & Plan Note (Signed)
07/04/2019:Routine screening detected an irregular, indeterminate architectural distortion in the left breast. US showed a 1.0cm mass at the 9 o'clock position. Biopsy showed IDC, grade 2, HER-2 - (1+), ER+ 100%, PR+ 100%, Ki67 15%.  T1cN0 stage Ia clinical stage  07/29/2019:Left lumpectomy [name]): IDC with DCIS, grade 2, 1.5cm, clear margins, and one lymph node negative.  ER 100%, PR 100%, Ki-67 15%, HER-2 negative T1CN0 stage Ia  Pathology counseling: I discussed the final pathology report of the patient provided  a copy of this report. I discussed the margins as well as lymph node surgeries. We also discussed the final staging along with previously performed ER/PR and HER-2/neu testing.  Treatment plan: 1. Oncotype DX testing to determine if chemotherapy would be of any benefit followed by 2. Adjuvant radiation therapy followed by 3. Adjuvant antiestrogen therapy  Return to clinic based upon Oncotype test result

## 2019-08-13 ENCOUNTER — Telehealth: Payer: Self-pay | Admitting: *Deleted

## 2019-08-13 ENCOUNTER — Encounter: Payer: Self-pay | Admitting: Hematology and Oncology

## 2019-08-13 DIAGNOSIS — C50212 Malignant neoplasm of upper-inner quadrant of left female breast: Secondary | ICD-10-CM

## 2019-08-13 DIAGNOSIS — Z170 Estrogen receptor positive status [ER+]: Secondary | ICD-10-CM

## 2019-08-13 NOTE — Telephone Encounter (Signed)
Received oncotype of 6/3%.  Patient aware.  Referral placed for Dr. Sondra Come.

## 2019-08-15 ENCOUNTER — Encounter: Payer: Self-pay | Admitting: *Deleted

## 2019-08-18 NOTE — Progress Notes (Signed)
Location of Breast Cancer:   Malignant neoplasm of upper-inner quadrant of left breast in female, estrogen receptor positive [facility])      Histology per Pathology Report: 07/29/19: Diagnosis 1. 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 Lymph node, sentinel, biopsy, left axillary - NO CARCINOMA IDENTIFIED IN ONE LYMPH NODE (0/1)  Receptor Status: ER(100%), PR (100%), Her2-neu (negative), Ki-(15%)  Did patient present with symptoms (if so, please note symptoms) or was this found on screening mammography?: She had routine screening mammography on 06/11/2019 showing a possible abnormality in the left breast. She underwent left diagnostic mammography with tomography and left breast ultrasonography at [facility] on 06/20/2019 showing: new 1 cm irregular mass at 9 o'clock.  Biopsy on 07/02/2019 showed: invasive ductal carcinoma, grade 2. Prognostic indicators significant for: estrogen receptor, 100% positive and progesterone receptor, 100% positive, both with strong staining intensity. Proliferation marker Ki67 at 15%. HER2 negative.  Past/Anticipated interventions by surgeon, if any: 07/29/19: PROCEDURE:   Procedure(s): LEFT BREAST LUMPECTOMY WITH RADIOACTIVE SEED AND LEFT AXILLARY SENTINEL LYMPH NODE BIOPSY, deep sentinel lymph node biopsy  SURGEON:   Alphonsa Overall, M.D.  Past/Anticipated interventions by medical oncology, if any: Chemotherapy Per Dr. Lindi Adie 08/07/19:  Treatment plan: 1. Oncotype DX testing to determine if chemotherapy would be of any benefit followed by 2. Adjuvant radiation therapy followed by 3. Adjuvant antiestrogen therapy  Patient was inquiring about her goal would risk.  Based upon the risk calculator  she is considered to be low risk Return to clinic based upon Oncotype test result  Lymphedema issues, if any:  Pt denies s/s lymphedema  Pain issues, if any:  Pt reports hematoma in LEFT breast, currently rates pain 3/10. Pt is wearing compression bra per surgeon.  SAFETY ISSUES:  Prior radiation? No  Pacemaker/ICD? No  Possible current pregnancy? No  Is the patient on methotrexate? No  Current Complaints / other details:  Pt presents today for consult with Dr. Sondra Come for Radiation Oncology.   BP 120/70 (BP Location: Right Arm, Patient Position: Sitting)   Pulse (!) 57   Temp 98.2 F (36.8 C) (Temporal)   Resp 16   Ht 5' 11.5" (1.816 m)   Wt 224 lb 3.2 oz (101.7 kg)   LMP 11/20/2012   SpO2 100%   BMI 30.83 kg/m   Wt Readings from Last 3 Encounters:  08/20/19 224 lb 3.2 oz (101.7 kg)  08/07/19 222 lb 14.4 oz (101.1 kg)  07/29/19 220 lb 4 oz (99.9 kg)       Loma Sousa, RN 08/20/2019,8:27 AM

## 2019-08-19 NOTE — Progress Notes (Signed)
Radiation Oncology         [phone] ________________________________  Name: Kathryn Webb MRN: [id number]  Date: 08/20/2019  DOB: 1961-01-23  Re-Evaluation Note  CC: Redmon, Genene Churn, MD    ICD-10-CM   1. Malignant neoplasm of upper-inner quadrant of left breast in female, estrogen receptor positive ([place])  C50.212    Z17.0      Diagnosis:   Stage IA (T1c, N0) Left Breast UIQ, Invasive Ductal Carcinoma with DCIS, ER+ / PR+ / Her2-, Grade 2  Narrative:  The patient returns today to discuss radiation treatment options. She was seen in the multidisciplinary breast clinic on  07/09/2019.    She opted to proceed with left lumpectomy with sentinel lymph node biopsy on date of 07/29/2019 with pathology report revealing: tumor size of 1.5 cm; histology of invasive carcinoma; margin status to invasive disease of uninvolved and margin status to in situ disease of uninvolved; nodal status of negative (0/1); Grade 2. Prognostic panel was note repeated (ER/PR+, Her2-).  Oncotype DX was obtained on the final surgical sample and the recurrence score of 6 predicts a risk of recurrence outside the breast over the next 9 years of 3%, if the patient's only systemic therapy is an antiestrogen for 5 years.  It also predicts no benefit from chemotherapy.  On review of systems, the patient reports discomfort in her left breast along her lumpectomy scar related to her hematoma.  She occasionally will take Percocet for this issue.. She denies pain along her axillary scar or problems with left arm or swelling.  She has good range of movement in her left arm and shoulder but has difficulty with her right shoulder due to nerve impingement.  She has seen physical therapy for her right arm and shoulder issue.    Allergies:  has No Known Allergies.  Meds: Current Outpatient Medications  Medication Sig Dispense Refill  . atorvastatin (LIPITOR) 20 MG tablet Take 20 mg by mouth daily.    . Blood  Glucose Monitoring Suppl (CONTOUR NEXT EZ MONITOR) W/DEVICE KIT 1 Act by Does not apply route 2 (two) times daily. 2 kit 0  . empagliflozin (JARDIANCE) 10 MG TABS tablet Take 10 mg by mouth daily.    . ergocalciferol (VITAMIN D2) 1.25 MG (50000 UT) capsule Take 50,000 Units by mouth once a week.    Marland Kitchen glucose blood (BAYER CONTOUR NEXT TEST) test strip Use BID 100 each 12  . losartan (COZAAR) 25 MG tablet Take 25 mg by mouth daily.    . metFORMIN (GLUCOPHAGE) 500 MG tablet TAKE 1 TABLET (500 MG TOTAL) BY MOUTH 2 (TWO) TIMES DAILY WITH A MEAL. (Patient taking differently: 1,000 mg 2 (two) times daily with a meal. ) 60 tablet 2  . oxyCODONE (OXY IR/ROXICODONE) 5 MG immediate release tablet TAKE 1 TABLET BY MOUTH EVERY FOUR HOURS, AS NEEDED    . Varenicline Tartrate (CHANTIX PO) Take by mouth 2 (two) times daily.     No current facility-administered medications for this encounter.     Physical Findings: The patient is in no acute distress. Patient is alert and oriented.  height is 5' 11.5" (1.816 m) and weight is 224 lb 3.2 oz (101.7 kg). Her temporal temperature is 98.2 F (36.8 C). Her blood pressure is 120/70 and her pulse is 57 (abnormal). Her respiration is 16 and oxygen saturation is 100%.   Lungs are clear to auscultation bilaterally. Heart has regular rate and rhythm. No palpable cervical,  supraclavicular, or axillary adenopathy. Abdomen soft, non-tender, normal bowel sounds. Right Breast: no palpable mass, nipple discharge or bleeding.  Large and pendulous. Left Breast: Patient has a lumpectomy scar in the periareolar area, upper inner location.  Patient has significant swelling in the upper inner quadrant related to her hematoma.  The estimated size of this is approximately 6 x 7 cm.  Patient has a small scar in the axillary region from her sentinel node procedure which is healing well without signs of drainage or infection.  No drainage or infection from her lumpectomy scar either.  She has  good range of movement in her left arm and shoulder..  She does have limited abduction of her right arm.  Lab Findings: Lab Results  Component Value Date   WBC 5.4 07/09/2019   HGB 12.6 07/09/2019   HCT 39.5 07/09/2019   MCV 84.4 07/09/2019   PLT 238 07/09/2019    Radiographic Findings: Nm Sentinel Node Inj-no Rpt (breast)  Result Date: 07/29/2019 Sulfur colloid was injected by the nuclear medicine technologist for melanoma sentinel node.    Impression:  Stage IA (T1c, N0) Left Breast UIQ, Invasive Ductal Carcinoma with DCIS, ER+ / PR+ / Her2-, Grade 2.  Patient would be an excellent candidate for breast conservation with radiation therapy directed at the left breast.  As above the patient does not require adjuvant chemotherapy as part of her overall management but would benefit from adjuvant hormonal therapy.  Today, I talked to the patient  about the findings and work-up thus far.  We discussed the natural history of invasive breast cnacer and general treatment, highlighting the role of radiotherapy in the management.  We discussed the available radiation techniques, and focused on the details of logistics and delivery.  We reviewed the anticipated acute and late sequelae associated with radiation in this setting.  The patient was encouraged to ask questions that I answered to the best of my ability.  A patient consent form was discussed and signed.  We retained a copy for our records.  The patient would like to proceed with radiation and will be scheduled for CT simulation.  Plan:  Patient is scheduled for CT simulation October 19 with treatments to begin October 26.  Treatment delayed some to allow additional time for resolution of the patient's hematoma.  We will  attempt hypofractionated accelerated radiation therapy but may not be possible given the patient's breast size.  ____________________________________ Gery Pray, MD   This document serves as a record of services personally  performed by Gery Pray, MD. It was created on his behalf by Wilburn Mylar, a trained medical scribe. The creation of this record is based on the scribe's personal observations and the provider's statements to them. This document has been checked and approved by the attending provider.

## 2019-08-20 ENCOUNTER — Ambulatory Visit
Admission: RE | Admit: 2019-08-20 | Discharge: 2019-08-20 | Disposition: A | Discharge status: Home / Self Care | Payer: BC Managed Care – PPO | Source: Ambulatory Visit | Attending: Radiation Oncology | Admitting: Radiation Oncology

## 2019-08-20 ENCOUNTER — Other Ambulatory Visit: Payer: Self-pay

## 2019-08-20 ENCOUNTER — Encounter: Payer: Self-pay | Admitting: Radiation Oncology

## 2019-08-20 VITALS — BP 120/70 | HR 57 | Temp 98.2°F | Resp 16 | Ht 71.5 in | Wt 224.2 lb

## 2019-08-20 DIAGNOSIS — Z170 Estrogen receptor positive status [ER+]: Secondary | ICD-10-CM

## 2019-08-20 DIAGNOSIS — Z7984 Long term (current) use of oral hypoglycemic drugs: Secondary | ICD-10-CM | POA: Diagnosis not present

## 2019-08-20 DIAGNOSIS — C50212 Malignant neoplasm of upper-inner quadrant of left female breast: Secondary | ICD-10-CM

## 2019-08-20 DIAGNOSIS — Z9889 Other specified postprocedural states: Secondary | ICD-10-CM | POA: Diagnosis not present

## 2019-08-20 DIAGNOSIS — Z79899 Other long term (current) drug therapy: Secondary | ICD-10-CM | POA: Diagnosis not present

## 2019-08-20 DIAGNOSIS — L7632 Postprocedural hematoma of skin and subcutaneous tissue following other procedure: Secondary | ICD-10-CM | POA: Diagnosis not present

## 2019-08-20 NOTE — Patient Instructions (Signed)
Coronavirus (COVID-19) Are you at risk?  Are you at risk for the Coronavirus (COVID-19)?  To be considered HIGH RISK for Coronavirus (COVID-19), you have to meet the following criteria:  . Traveled to [place], [place], [place], [place] or [place]; or in the [place] to [place], [place], [place], or [place]; and have fever, cough, and shortness of breath within the last 2 weeks of travel OR . Been in close contact with a person diagnosed with COVID-19 within the last 2 weeks and have fever, cough, and shortness of breath . IF YOU DO NOT MEET THESE CRITERIA, YOU ARE CONSIDERED LOW RISK FOR COVID-19.  What to do if you are HIGH RISK for COVID-19?  . If you are having a medical emergency, call 911. . Seek medical care right away. Before you go to a doctor's office, urgent care or emergency department, call ahead and tell them about your recent travel, contact with someone diagnosed with COVID-19, and your symptoms. You should receive instructions from your physician's office regarding next steps of care.  . When you arrive at healthcare provider, tell the healthcare staff immediately you have returned from visiting [place], [place], [place], [place] or [place]; or traveled in the [place] to [place], [place], [place], or [place]; in the last two weeks or you have been in close contact with a person diagnosed with COVID-19 in the last 2 weeks.   . Tell the health care staff about your symptoms: fever, cough and shortness of breath. . After you have been seen by a medical provider, you will be either: o Tested for (COVID-19) and discharged home on quarantine except to seek medical care if symptoms worsen, and asked to  - Stay home and avoid contact with others until you get your results (4-5 days)  - Avoid travel on public transportation if possible (such as bus, train, or airplane) or o Sent to the Emergency Department by EMS for evaluation, COVID-19 testing, and possible  admission depending on your condition and test results.  What to do if you are LOW RISK for COVID-19?  Reduce your risk of any infection by using the same precautions used for avoiding the common cold or flu:  . Wash your hands often with soap and warm water for at least 20 seconds.  If soap and water are not readily available, use an alcohol-based hand sanitizer with at least 60% alcohol.  . If coughing or sneezing, cover your mouth and nose by coughing or sneezing into the elbow areas of your shirt or coat, into a tissue or into your sleeve (not your hands). . Avoid shaking hands with others and consider head nods or verbal greetings only. . Avoid touching your eyes, nose, or mouth with unwashed hands.  . Avoid close contact with people who are sick. . Avoid places or events with large numbers of people in one location, like concerts or sporting events. . Carefully consider travel plans you have or are making. . If you are planning any travel outside or inside the [place], visit the CDC's Travelers' Health webpage for the latest health notices. . If you have some symptoms but not all symptoms, continue to monitor at home and seek medical attention if your symptoms worsen. . If you are having a medical emergency, call 911.   ADDITIONAL HEALTHCARE OPTIONS FOR PATIENTS  [facility] / e-Visit: [email or web address]         [facility]: [phone]  [facility]: [phone]                   [facility]: [phone]   

## 2019-08-25 ENCOUNTER — Other Ambulatory Visit: Payer: Self-pay

## 2019-08-25 ENCOUNTER — Encounter: Payer: Self-pay | Admitting: Physical Therapy

## 2019-08-25 ENCOUNTER — Ambulatory Visit: Payer: BC Managed Care – PPO | Attending: Surgery | Admitting: Physical Therapy

## 2019-08-25 ENCOUNTER — Encounter: Payer: Self-pay | Admitting: Hematology and Oncology

## 2019-08-25 DIAGNOSIS — G8929 Other chronic pain: Secondary | ICD-10-CM

## 2019-08-25 DIAGNOSIS — R600 Localized edema: Secondary | ICD-10-CM

## 2019-08-25 DIAGNOSIS — C50312 Malignant neoplasm of lower-inner quadrant of left female breast: Secondary | ICD-10-CM | POA: Diagnosis not present

## 2019-08-25 DIAGNOSIS — Z170 Estrogen receptor positive status [ER+]: Secondary | ICD-10-CM

## 2019-08-25 DIAGNOSIS — R293 Abnormal posture: Secondary | ICD-10-CM | POA: Diagnosis not present

## 2019-08-25 DIAGNOSIS — M25611 Stiffness of right shoulder, not elsewhere classified: Secondary | ICD-10-CM

## 2019-08-25 DIAGNOSIS — M25511 Pain in right shoulder: Secondary | ICD-10-CM | POA: Diagnosis not present

## 2019-08-25 DIAGNOSIS — Z483 Aftercare following surgery for neoplasm: Secondary | ICD-10-CM | POA: Diagnosis not present

## 2019-08-25 NOTE — Therapy (Signed)
[place], [place], [address] Phone: [phone]   Fax:  [phone]  Physical Therapy Treatment  Patient Details  Name: Kathryn Webb MRN: [id number] Date of Birth: 04-04-1961 Referring Provider (PT): [name]   Encounter Date: 08/25/2019  PT End of Session - 08/25/19 1221    Visit Number  6    Number of Visits  14    Date for PT Re-Evaluation  09/22/19    PT Start Time  1100    PT Stop Time  1158    PT Time Calculation (min)  58 min    Activity Tolerance  Patient tolerated treatment well    Behavior During Therapy  [facility] for tasks assessed/performed       Past Medical History:  Diagnosis Date  . Acute medial meniscus tear   . Arthritis   . Cancer ([place]) 06/2019   left breast IDC  . Diabetes mellitus without complication ([place])   . GERD (gastroesophageal reflux disease)   . Hidradenitis suppurativa   . Serum lipids high     Past Surgical History:  Procedure Laterality Date  . AXILLARY HIDRADENITIS EXCISION    . BREAST LUMPECTOMY WITH RADIOACTIVE SEED AND SENTINEL LYMPH NODE BIOPSY Left 07/29/2019   Procedure: LEFT BREAST LUMPECTOMY WITH RADIOACTIVE SEED AND LEFT AXILLARY SENTINEL LYMPH NODE BIOPSY;  Surgeon: Alphonsa Overall, MD;  Location: [place];  Service: General;  Laterality: Left;  . CHONDROPLASTY Right 01/24/2017   Procedure: CHONDROPLASTY PATELLA  FEMORAL JOINT;  Surgeon: Dorna Leitz, MD;  Location: [place];  Service: Orthopedics;  Laterality: Right;  . KNEE ARTHROSCOPY WITH LATERAL MENISECTOMY Right 01/24/2017   Procedure: KNEE ARTHROSCOPY WITH LATERAL MENISECTOMY/ PARTIAL;  Surgeon: Dorna Leitz, MD;  Location: [place];  Service: Orthopedics;  Laterality: Right;  . KNEE ARTHROSCOPY WITH MEDIAL MENISECTOMY Right 01/24/2017   Procedure: ARTHROSCOPY KNEE, PARTIAL MEDIAL  MENISCECTOMY,;  Surgeon: Dorna Leitz, MD;  Location: [place];  Service: Orthopedics;  Laterality: Right;  . PERINEAL HIDRADENITIS EXCISION      There were no vitals filed for this visit.  Subjective Assessment - 08/25/19 1104    Subjective  Patient underwent a left lumpectomy on 07/29/2019 with 1 negative lymph node removed. Oncotype score of 6 so no chemotherapy needed. She will have radiation.    Pertinent History  Patient was diagnosed on 06/11/2019 with left grade II invasive ductal carcinoma breast cancer. Patient underwent a left lumpectomy on 07/29/2019 with 1 negative lymph node removed. It is ER/PR positive and HER2 negative with a Ki67 of 15%. She had a right total knee replacement in 2018.    Patient Stated Goals  Make sure my left arm is ok. Decrease pain in right shoulder.    Currently in Pain?  Yes    Pain Score  5     Pain Location  Shoulder    Pain Orientation  Right    Pain Descriptors / Indicators  Aching;Sharp;Throbbing    Pain Type  Chronic pain    Pain Onset  More than a month ago    Pain Frequency  Intermittent    Aggravating Factors   Sleeping, donning and doffing bra    Pain Relieving Factors  Rest         [facility] PT Assessment - 08/25/19 0001      Assessment   Medical Diagnosis  s/p left lumpectomy SLNB    Referring Provider (PT)  [name]    Onset Date/Surgical Date  07/29/19    Hand Dominance  Right    Prior Therapy  Baselines and 2 PT visits for right shoulder      Precautions   Precautions  Other (comment)    Precaution Comments  recent surgery      Restrictions   Weight Bearing Restrictions  No      Balance Screen   Has the patient fallen in the past 6 months  No    Has the patient had a decrease in activity level because of a fear of falling?   No    Is the patient reluctant to leave their home because of a fear of falling?   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    Leisure  She is walking 30-45 minutes daily      Cognition   Overall Cognitive Status  Within Functional Limits for tasks assessed      Observation/Other Assessments   Observations  Edema present left medial breast and inferior breast; placed compression foam inside left side of bra to reduce edema.      Posture/Postural Control   Posture/Postural Control  Postural limitations    Postural Limitations  Rounded Shoulders;Forward head      ROM / Strength   AROM / PROM / Strength  AROM      AROM   AROM Assessment Site  Shoulder    Right/Left Shoulder  Right;Left    Right Shoulder Extension  46 Degrees    Right Shoulder Flexion  86 Degrees    Right Shoulder ABduction  89 Degrees    Right Shoulder Internal Rotation  55 Degrees   Difficulty obtaining position   Left Shoulder Extension  69 Degrees    Left Shoulder Flexion  142 Degrees    Left Shoulder ABduction  140 Degrees    Left Shoulder Internal Rotation  77 Degrees    Left Shoulder External Rotation  85 Degrees      Palpation   Patella mobility  Significant pain with palpation to right anterior shoulder at biceps insertion and along lateral deltoid.      Empty Can test   Findings  Positive    Side  Right    Comment  Pain and weakness present with difficulty achieving positioning        LYMPHEDEMA/ONCOLOGY QUESTIONNAIRE - 08/25/19 1127      Type   Cancer Type  Left breast cancer      Surgeries   Lumpectomy Date  07/29/19    Sentinel Lymph Node Biopsy Date  07/29/19    Number Lymph Nodes Removed  1      Treatment   Active Chemotherapy Treatment  No    Past Chemotherapy Treatment  No    Active Radiation Treatment  No    Past Radiation Treatment  No    Current Hormone Treatment  No    Past Hormone Therapy  No      What other symptoms do you have   Are you Having  Heaviness or Tightness  No    Are you having Pain  Yes  Are you having pitting edema  No    Is it Hard or Difficult finding clothes that fit  No    Do you have infections  No    Is there Decreased scar mobility  Yes    Stemmer Sign  No      Lymphedema Assessments   Lymphedema Assessments  Upper extremities      Right Upper Extremity Lymphedema   10 cm Proximal to Olecranon Process  30 cm    Olecranon Process  25.7 cm    10 cm Proximal to Ulnar Styloid Process  21.3 cm    Just Proximal to Ulnar Styloid Process  16.6 cm    Across Hand at [employer]  20.3 cm    At [place] of 2nd Digit  7 cm      Left Upper Extremity Lymphedema   10 cm Proximal to Olecranon Process  31.5 cm    Olecranon Process  25.7 cm    10 cm Proximal to Ulnar Styloid Process  21.4 cm    Just Proximal to Ulnar Styloid Process  16.3 cm    Across Hand at [employer]  19.3 cm    At [place] of 2nd Digit  6.4 cm        Quick Dash - 08/25/19 0001    Open a tight or new jar  Moderate difficulty    Do heavy household chores (wash walls, wash floors)  Severe difficulty    Carry a shopping bag or briefcase  Moderate difficulty    Wash your back  Severe difficulty    Use a knife to cut food  No difficulty    Recreational activities in which you take some force or impact through your arm, shoulder, or hand (golf, hammering, tennis)  Unable    During the past week, to what extent has your arm, shoulder or hand problem interfered with your normal social activities with family, friends, neighbors, or groups?  Not at all    During the past week, to what extent has your arm, shoulder or hand problem limited your work or other regular daily activities  Modererately    Arm, shoulder, or hand pain.  Moderate    Tingling (pins and needles) in your arm, shoulder, or hand  None    Difficulty Sleeping  Severe difficulty    DASH Score  47.73 %                       PT Short Term Goals - 08/25/19 1228       PT SHORT TERM GOAL #1   Title  Patient will be independent with a HEP for right shoulder ROM and posture.    Time  4    Period  Weeks    Status  Achieved      PT SHORT TERM GOAL #2   Title  Patient will improve DASH score to </= 11.36 for improved overall shoulder function on the right side.    Baseline  47.73 at re-eval 08/25/2019    Time  4    Period  Weeks    Status  New      PT SHORT TERM GOAL #3   Title  Patient will report >/= 40% less right shoulder pain with daily tasks including household tasks and work tasks.    Time  4    Period  Weeks    Status  Revised      PT SHORT TERM  GOAL #4   Title  Patient will report >/= 25% less difficulty donning and doffing her sports bra.    Time  4    Period  Weeks    Status  On-going      PT SHORT TERM GOAL #5   Title  Patient will increase right shoulder strength to >/= 4+/5 for improved ability to perform daily tasks.    Time  4    Period  Weeks    Status  On-going        PT Long Term Goals - 08/25/19 1231      PT LONG TERM GOAL #1   Title  Patient will demonstrate she has regained full left shoulder ROM and function post operatively compared to baselines.    Time  8    Period  Weeks    Status  Achieved            Plan - 08/25/19 1222    Clinical Impression Statement  Patient is doing very well s/p left lumpectomy and SLNB. She will not need chemotherapy but will begin radiation in 2 weeks. Her left shoulder appears to be back to baseline with ROM and function and there are no signs of lymphedema. Her right shoulder, which we were treating in PT prior to surgery appears to be significantly worse since surgery. This is somewhat to be expected as she compensated for her left shoulder after surgery overusing her right. She will benefit from resuming PT on her right shoulder to try to obtain baseline function and motion. She has a hematoma and some edema in her left breast but a piece of compression foam was placed today and  she was encouraged to get a more snug fitting sports bra. If that edema does not reduce, we can consider manual lymph drainage but it is too tender at this time.    PT Frequency  2x / week    PT Duration  4 weeks    PT Treatment/Interventions  ADLs/Self Care Home Management;Therapeutic exercise;Patient/family education;Neuromuscular re-education;Manual techniques;Passive range of motion;Taping;Manual lymph drainage;Moist Heat;Cryotherapy;Iontophoresis 59m/ml Dexamethasone;Joint Manipulations   Ionto, heat and ice are specifically for right shoulder   PT Next Visit Plan  Ionto right shoulder if MD signs re-cert; ice/heat as needed for right shoulder. Assess left breast and see if she needs manual lymph drainage. Left shoulder PROM and AAROM exercises    Consulted and Agree with Plan of Care  Patient       Patient will benefit from skilled therapeutic intervention in order to improve the following deficits and impairments:  Decreased knowledge of precautions, Pain, Impaired UE functional use, Decreased strength, Postural dysfunction, Decreased range of motion, Increased edema  Visit Diagnosis: Abnormal posture - Plan: PT plan of care cert/re-cert  Chronic right shoulder pain - Plan: PT plan of care cert/re-cert  Malignant neoplasm of lower-inner quadrant of left breast in female, estrogen receptor positive ([place] - Plan: PT plan of care cert/re-cert  Aftercare following surgery for neoplasm - Plan: PT plan of care cert/re-cert  Localized edema - Plan: PT plan of care cert/re-cert  Stiffness of right shoulder, not elsewhere classified - Plan: PT plan of care cert/re-cert     Problem List Patient Active Problem List   Diagnosis Date Noted  . Malignant neoplasm of upper-inner quadrant of left breast in female, estrogen receptor positive ([place] 07/04/2019  . Acute medial meniscal tear, right, initial encounter 01/24/2017  . Chondromalacia patellae, right knee 01/24/2017  . Acute lateral  meniscal tear, right, initial encounter 01/24/2017  . Hyperlipidemia LDL goal < 100 03/11/2014  . Dysuria 03/11/2014  . Tobacco abuse 03/11/2014  . Routine general medical examination at a health care facility 03/11/2014  . Hypercalcemia 03/11/2014  . Need for pneumococcal vaccination 03/11/2014  . Other and unspecified hyperlipidemia 03/14/2013  . Diabetes mellitus type 2 in obese [facility]) 03/14/2013   Annia Friendly, PT 08/25/19 12:35 PM  [place], [place], [address] Phone: [phone]   Fax:  [phone]  Name: Kathryn Webb MRN: [id number] Date of Birth: [birth year]

## 2019-08-27 ENCOUNTER — Other Ambulatory Visit: Payer: Self-pay

## 2019-08-27 ENCOUNTER — Ambulatory Visit: Payer: BC Managed Care – PPO | Admitting: Physical Therapy

## 2019-08-27 ENCOUNTER — Encounter: Payer: Self-pay | Admitting: Physical Therapy

## 2019-08-27 DIAGNOSIS — R293 Abnormal posture: Secondary | ICD-10-CM

## 2019-08-27 DIAGNOSIS — C50312 Malignant neoplasm of lower-inner quadrant of left female breast: Secondary | ICD-10-CM | POA: Diagnosis not present

## 2019-08-27 DIAGNOSIS — Z170 Estrogen receptor positive status [ER+]: Secondary | ICD-10-CM | POA: Diagnosis not present

## 2019-08-27 DIAGNOSIS — G8929 Other chronic pain: Secondary | ICD-10-CM | POA: Diagnosis not present

## 2019-08-27 DIAGNOSIS — Z483 Aftercare following surgery for neoplasm: Secondary | ICD-10-CM

## 2019-08-27 DIAGNOSIS — M25511 Pain in right shoulder: Secondary | ICD-10-CM

## 2019-08-27 DIAGNOSIS — M25611 Stiffness of right shoulder, not elsewhere classified: Secondary | ICD-10-CM

## 2019-08-27 DIAGNOSIS — R600 Localized edema: Secondary | ICD-10-CM

## 2019-08-27 NOTE — Therapy (Signed)
[place], [place], [address] Phone: [phone]   Fax:  [phone]  Physical Therapy Treatment  Patient Details  Name: Kathryn Webb MRN: [id number] Date of Birth: [birth year] Referring Provider (PT): [name]   Encounter Date: 08/27/2019  PT End of Session - 08/27/19 0859    Visit Number  7    Number of Visits  14    Date for PT Re-Evaluation  09/22/19    PT Start Time  0802    PT Stop Time  0845    PT Time Calculation (min)  43 min    Activity Tolerance  Patient tolerated treatment well    Behavior During Therapy  [facility] for tasks assessed/performed       Past Medical History:  Diagnosis Date  . Acute medial meniscus tear   . Arthritis   . Cancer ([place]) 06/2019   left breast IDC  . Diabetes mellitus without complication ([place])   . GERD (gastroesophageal reflux disease)   . Hidradenitis suppurativa   . Serum lipids high     Past Surgical History:  Procedure Laterality Date  . AXILLARY HIDRADENITIS EXCISION    . BREAST LUMPECTOMY WITH RADIOACTIVE SEED AND SENTINEL LYMPH NODE BIOPSY Left 07/29/2019   Procedure: LEFT BREAST LUMPECTOMY WITH RADIOACTIVE SEED AND LEFT AXILLARY SENTINEL LYMPH NODE BIOPSY;  Surgeon: Alphonsa Overall, MD;  Location: [place];  Service: General;  Laterality: Left;  . CHONDROPLASTY Right 01/24/2017   Procedure: CHONDROPLASTY PATELLA  FEMORAL JOINT;  Surgeon: Dorna Leitz, MD;  Location: [place];  Service: Orthopedics;  Laterality: Right;  . KNEE ARTHROSCOPY WITH LATERAL MENISECTOMY Right 01/24/2017   Procedure: KNEE ARTHROSCOPY WITH LATERAL MENISECTOMY/ PARTIAL;  Surgeon: Dorna Leitz, MD;  Location: [place];  Service: Orthopedics;  Laterality: Right;  . KNEE ARTHROSCOPY WITH MEDIAL MENISECTOMY Right 01/24/2017   Procedure: ARTHROSCOPY KNEE, PARTIAL MEDIAL  MENISCECTOMY,;  Surgeon: Dorna Leitz, MD;  Location: [place];  Service: Orthopedics;  Laterality: Right;  . PERINEAL HIDRADENITIS EXCISION      There were no vitals filed for this visit.  Subjective Assessment - 08/27/19 0809    Subjective  Pr remembered she had a flu shot last week and that may have something to do with it.  She did have pain before but its a little worse now    Pertinent History  Patient was diagnosed on 06/11/2019 with left grade II invasive ductal carcinoma breast cancer. Patient underwent a left lumpectomy on 07/29/2019 with 1 negative lymph node removed. It is ER/PR positive and HER2 negative with a Ki67 of 15%. She had a right total knee replacement in 2018.    Patient Stated Goals  Make sure my left arm is ok. Decrease pain in right shoulder.    Currently in Pain?  Yes    Pain Score  3     Pain Location  Shoulder    Pain Orientation  Right    Pain Descriptors / Indicators  Aching;Throbbing    Pain Type  Chronic pain    Pain Onset  More than a month ago    Pain Frequency  Intermittent    Aggravating Factors   trying to put clothose on    Pain Relieving Factors  rest and leave arm down                       [facility] Adult PT Treatment/Exercise -  08/27/19 0001      Shoulder Exercises: Standing   Other Standing Exercises  10 x 2 sets of trunk rotation to left and then follow up with right shoulder abd with less pain experienced, then did 5 sec stretch at left trunk rotation end range with less right shoulder pain.  Pt will follow up wiht this at home       Shoulder Exercises: Isometric Strengthening   Other Isometric Exercises  with manual resistance 10 reps of 3 sec holds for right shoulder  IR, ER,  abd       Modalities   Modalities  Iontophoresis;Moist Heat      Moist Heat Therapy   Number Minutes Moist Heat  15 Minutes    Moist Heat Location  Shoulder   and upper back on the right      Iontophoresis   Type of Iontophoresis  Dexamethasone    Location  right shoulder at bicep tendon     Dose  1 ml of 4 mg /ml dexamethasone     Time  6 hours       Manual Therapy   Manual Therapy  Edema management;Joint mobilization;Manual Lymphatic Drainage (MLD);Passive ROM    Edema Management  pt will continue to wear light compression patch on left breast     Joint Mobilization  right G-H traction and gentle posterior and inferior glides     Manual Lymphatic Drainage (MLD)  briefly to left side, short neck  5 diaphragmatic breaths, abdominal area below left breast and across chest medial to hematoma     Passive ROM  right shoulder in conventional and diagonal patterns limited by pain                PT Short Term Goals - 08/25/19 1228      PT SHORT TERM GOAL #1   Title  Patient will be independent with a HEP for right shoulder ROM and posture.    Time  4    Period  Weeks    Status  Achieved      PT SHORT TERM GOAL #2   Title  Patient will improve DASH score to </= 11.36 for improved overall shoulder function on the right side.    Baseline  47.73 at re-eval 08/25/2019    Time  4    Period  Weeks    Status  New      PT SHORT TERM GOAL #3   Title  Patient will report >/= 40% less right shoulder pain with daily tasks including household tasks and work tasks.    Time  4    Period  Weeks    Status  Revised      PT SHORT TERM GOAL #4   Title  Patient will report >/= 25% less difficulty donning and doffing her sports bra.    Time  4    Period  Weeks    Status  On-going      PT SHORT TERM GOAL #5   Title  Patient will increase right shoulder strength to >/= 4+/5 for improved ability to perform daily tasks.    Time  4    Period  Weeks    Status  On-going        PT Long Term Goals - 08/25/19 1231      PT LONG TERM GOAL #1   Title  Patient will demonstrate she has regained full left shoulder ROM and function post operatively compared to baselines.  Time  8    Period  Weeks    Status  Achieved            Plan - 08/27/19 0900    Clinical Impression  Statement  Pt had relieft with moist heat and will follow up with that at home for no more than 15- 20 minutes and will do right shoulder AROM. She felt some relief after session    Stability/Clinical Decision Making  Stable/Uncomplicated    Rehab Potential  Excellent    PT Frequency  2x / week    PT Duration  4 weeks    PT Treatment/Interventions  ADLs/Self Care Home Management;Therapeutic exercise;Patient/family education;Neuromuscular re-education;Manual techniques;Passive range of motion;Taping;Manual lymph drainage;Moist Heat;Cryotherapy;Iontophoresis 15m/ml Dexamethasone;Joint Manipulations    PT Next Visit Plan  cont,Ionto right shoulder heat as needed for right shoulder. cont with isometrics and strengthening to right sholder along with joint mobilizaion and manual techniques  Assess left breast and see if she needs manual lymph drainage. Left shoulder PROM and AAROM exercises    Consulted and Agree with Plan of Care  Patient       Patient will benefit from skilled therapeutic intervention in order to improve the following deficits and impairments:  Decreased knowledge of precautions, Pain, Impaired UE functional use, Decreased strength, Postural dysfunction, Decreased range of motion, Increased edema  Visit Diagnosis: Abnormal posture  Chronic right shoulder pain  Aftercare following surgery for neoplasm  Localized edema  Stiffness of right shoulder, not elsewhere classified     Problem List Patient Active Problem List   Diagnosis Date Noted  . Malignant neoplasm of upper-inner quadrant of left breast in female, estrogen receptor positive ([place] 07/04/2019  . Acute medial meniscal tear, right, initial encounter 01/24/2017  . Chondromalacia patellae, right knee 01/24/2017  . Acute lateral meniscal tear, right, initial encounter 01/24/2017  . Hyperlipidemia LDL goal < 100 03/11/2014  . Dysuria 03/11/2014  . Tobacco abuse 03/11/2014  . Routine general medical examination at  a health care facility 03/11/2014  . Hypercalcemia 03/11/2014  . Need for pneumococcal vaccination 03/11/2014  . Other and unspecified hyperlipidemia 03/14/2013  . Diabetes mellitus type 2 in obese ([place] 03/14/2013   Kathryn Webb Kathryn SharkPT  BNorwood Levo10/05/2019, 9:03 AM  [place] [address]Phone: [phone]  Fax:  [phone] Name: Kathryn KutschMRN: [id number] of Birth: 9Mar 02, 1962

## 2019-09-02 ENCOUNTER — Ambulatory Visit: Payer: BC Managed Care – PPO

## 2019-09-02 ENCOUNTER — Other Ambulatory Visit: Payer: Self-pay

## 2019-09-02 DIAGNOSIS — R600 Localized edema: Secondary | ICD-10-CM

## 2019-09-02 DIAGNOSIS — M25611 Stiffness of right shoulder, not elsewhere classified: Secondary | ICD-10-CM

## 2019-09-02 DIAGNOSIS — Z170 Estrogen receptor positive status [ER+]: Secondary | ICD-10-CM

## 2019-09-02 DIAGNOSIS — C50312 Malignant neoplasm of lower-inner quadrant of left female breast: Secondary | ICD-10-CM

## 2019-09-02 DIAGNOSIS — R293 Abnormal posture: Secondary | ICD-10-CM | POA: Diagnosis not present

## 2019-09-02 DIAGNOSIS — G8929 Other chronic pain: Secondary | ICD-10-CM | POA: Diagnosis not present

## 2019-09-02 DIAGNOSIS — M25511 Pain in right shoulder: Secondary | ICD-10-CM

## 2019-09-02 DIAGNOSIS — Z483 Aftercare following surgery for neoplasm: Secondary | ICD-10-CM | POA: Diagnosis not present

## 2019-09-02 NOTE — Patient Instructions (Signed)

## 2019-09-02 NOTE — Therapy (Signed)
[place], [place], [address] Phone: [phone]   Fax:  [phone]  Physical Therapy Treatment  Patient Details  Name: Kathryn Webb MRN: [id number] Date of Birth: 1961/01/29 Referring Provider (PT): [name]   Encounter Date: 09/02/2019  PT End of Session - 09/02/19 1107    Visit Number  8    Number of Visits  14    Date for PT Re-Evaluation  09/22/19    PT Start Time  1000    PT Stop Time  1100    PT Time Calculation (min)  60 min    Activity Tolerance  Patient tolerated treatment well    Behavior During Therapy  [facility] for tasks assessed/performed       Past Medical History:  Diagnosis Date  . Acute medial meniscus tear   . Arthritis   . Cancer ([place]) 06/2019   left breast IDC  . Diabetes mellitus without complication ([place])   . GERD (gastroesophageal reflux disease)   . Hidradenitis suppurativa   . Serum lipids high     Past Surgical History:  Procedure Laterality Date  . AXILLARY HIDRADENITIS EXCISION    . BREAST LUMPECTOMY WITH RADIOACTIVE SEED AND SENTINEL LYMPH NODE BIOPSY Left 07/29/2019   Procedure: LEFT BREAST LUMPECTOMY WITH RADIOACTIVE SEED AND LEFT AXILLARY SENTINEL LYMPH NODE BIOPSY;  Surgeon: Alphonsa Overall, MD;  Location: [facility];  Service: General;  Laterality: Left;  . CHONDROPLASTY Right 01/24/2017   Procedure: CHONDROPLASTY PATELLA  FEMORAL JOINT;  Surgeon: Dorna Leitz, MD;  Location: [facility];  Service: Orthopedics;  Laterality: Right;  . KNEE ARTHROSCOPY WITH LATERAL MENISECTOMY Right 01/24/2017   Procedure: KNEE ARTHROSCOPY WITH LATERAL MENISECTOMY/ PARTIAL;  Surgeon: Dorna Leitz, MD;  Location: [facility];  Service: Orthopedics;  Laterality: Right;  . KNEE ARTHROSCOPY WITH MEDIAL MENISECTOMY Right 01/24/2017   Procedure: ARTHROSCOPY KNEE, PARTIAL MEDIAL  MENISCECTOMY,;  Surgeon: Dorna Leitz, MD;  Location: [facility];  Service: Orthopedics;  Laterality: Right;  . PERINEAL HIDRADENITIS EXCISION      There were no vitals filed for this visit.  Subjective Assessment - 09/02/19 1010    Subjective  Pt reports that she is still getting a catch in her R shoulder when she reaches but she feels better overall and has been performing her exercises at home.    Pertinent History  Patient was diagnosed on 06/11/2019 with left grade II invasive ductal carcinoma breast cancer. Patient underwent a left lumpectomy on 07/29/2019 with 1 negative lymph node removed. It is ER/PR positive and HER2 negative with a Ki67 of 15%. She had a right total knee replacement in 2018.    Patient Stated Goals  Make sure my left arm is ok. Decrease pain in right shoulder.    Currently in Pain?  Yes    Pain Score  3    7at the worst   Pain Location  Shoulder    Pain Orientation  Right    Pain Descriptors / Indicators  Aching;Throbbing    Pain Type  Chronic pain    Pain Onset  More than a month ago    Pain Frequency  Intermittent    Aggravating Factors   Trying toput clothes on    Pain Relieving Factors  rest and leave arm down    Multiple Pain Sites  No  [facility] Adult PT Treatment/Exercise - 09/02/19 0001      Shoulder Exercises: Supine   Other Supine Exercises  supine flexion with wide grip on dowel for range of motion after surgery     Other Supine Exercises  Supine ER pec stretch 3x20 seconds avoiding pain      Manual Therapy   Manual Therapy  Joint mobilization;Soft tissue mobilization;Manual Lymphatic Drainage (MLD)    Joint Mobilization  Grade II 8x 8 sets inf and posterior to the R glenohumeral joint for pain relief.     Soft tissue mobilization  STM to the R deltoid and biceps with focus on the R biceps due to tenderness noted at the long head of the biceps and throughout the belly of the biceps with significant decrease in crepitus and tightness following STM    Manual  Lymphatic Drainage (MLD)  MLD for the R breast with step by step instructions and pt performing following each movement with 5 deep breathes followed by stimulation of R axillary lymph nodes and L inguinal lymph nodes, anterior inter-axillary anastomosis to the R, axillo-inguinal anastomosis on the L,superior breast toward inter-axillary anastomosis and inferior breast toward L aillo-inguinal anastomosis followed by re-working all surfaces.     Passive ROM  Right shoulder flexion/abduction with VC and oscillations for relaxation. Pt had pain initially on adduction and extension back to neutral but with heavy cueing and focus on relaxation pt had no sharp pain with PROM             PT Education - 09/02/19 1100    Education Details  Pt was provided with hand out for self MLD of the breast. Educated on step by step instructions with return demonstration. Discussed light touch with skin stretch and correct direction to direct fluid flow.    Person(s) Educated  Patient    Methods  Explanation;Demonstration;Tactile cues;Verbal cues;Handout    Comprehension  Verbalized understanding;Returned demonstration       PT Short Term Goals - 08/25/19 1228      PT SHORT TERM GOAL #1   Title  Patient will be independent with a HEP for right shoulder ROM and posture.    Time  4    Period  Weeks    Status  Achieved      PT SHORT TERM GOAL #2   Title  Patient will improve DASH score to </= 11.36 for improved overall shoulder function on the right side.    Baseline  47.73 at re-eval 08/25/2019    Time  4    Period  Weeks    Status  New      PT SHORT TERM GOAL #3   Title  Patient will report >/= 40% less right shoulder pain with daily tasks including household tasks and work tasks.    Time  4    Period  Weeks    Status  Revised      PT SHORT TERM GOAL #4   Title  Patient will report >/= 25% less difficulty donning and doffing her sports bra.    Time  4    Period  Weeks    Status  On-going       PT SHORT TERM GOAL #5   Title  Patient will increase right shoulder strength to >/= 4+/5 for improved ability to perform daily tasks.    Time  4    Period  Weeks    Status  On-going        PT Long Term  Goals - 08/25/19 1231      PT LONG TERM GOAL #1   Title  Patient will demonstrate she has regained full left shoulder ROM and function post operatively compared to baselines.    Time  8    Period  Weeks    Status  Achieved            Plan - 09/02/19 1108    Clinical Impression Statement  Pt presents today with pain in her R shoulder with AROM elevation against gravity. Decreased in supine with AAROM activities. Palpable tightness/tenderness noted throughout the R biceps and deltoid; decreased following STM and light TpR. Pt was experiencing sharp pain during flexion/abd ROM during adduction and extension into neutral from flexion that decreasd significantly following oscillations and VC for focus on relaxation during PROM. Pt was taught MLD for the L breast this session in order to decrease edema associated with hematoma. Pt will benefit from continued POC.    PT Frequency  2x / week    PT Duration  4 weeks    PT Treatment/Interventions  ADLs/Self Care Home Management;Therapeutic exercise;Patient/family education;Neuromuscular re-education;Manual techniques;Passive range of motion;Taping;Manual lymph drainage;Moist Heat;Cryotherapy;Iontophoresis 67m/ml Dexamethasone;Joint Manipulations    PT Next Visit Plan  taping at the L breast, ionto if needed, progress shoulder exercises to isometrics with band, assess self MLD    PT Home Exercise Plan  [id number] continue with current HEP    Consulted and Agree with Plan of Care  Patient       Patient will benefit from skilled therapeutic intervention in order to improve the following deficits and impairments:  Decreased knowledge of precautions, Pain, Impaired UE functional use, Decreased strength, Postural dysfunction, Decreased range  of motion, Increased edema  Visit Diagnosis: Abnormal posture  Chronic right shoulder pain  Aftercare following surgery for neoplasm  Localized edema  Stiffness of right shoulder, not elsewhere classified  Malignant neoplasm of lower-inner quadrant of left breast in female, estrogen receptor positive ([place]     Problem List Patient Active Problem List   Diagnosis Date Noted  . Malignant neoplasm of upper-inner quadrant of left breast in female, estrogen receptor positive ([place] 07/04/2019  . Acute medial meniscal tear, right, initial encounter 01/24/2017  . Chondromalacia patellae, right knee 01/24/2017  . Acute lateral meniscal tear, right, initial encounter 01/24/2017  . Hyperlipidemia LDL goal < 100 03/11/2014  . Dysuria 03/11/2014  . Tobacco abuse 03/11/2014  . Routine general medical examination at a health care facility 03/11/2014  . Hypercalcemia 03/11/2014  . Need for pneumococcal vaccination 03/11/2014  . Other and unspecified hyperlipidemia 03/14/2013  . Diabetes mellitus type 2 in obese ([facility] 03/14/2013    CGretel AcreHealy,PT 09/02/2019, 11:13 AM  [place] [address]Phone: [phone]  Fax:  [phone] Name: Kathryn ReslerMRN: [id number] of Birth: 906/18/1962

## 2019-09-04 ENCOUNTER — Ambulatory Visit: Payer: BC Managed Care – PPO

## 2019-09-04 ENCOUNTER — Other Ambulatory Visit: Payer: Self-pay

## 2019-09-04 DIAGNOSIS — G8929 Other chronic pain: Secondary | ICD-10-CM | POA: Diagnosis not present

## 2019-09-04 DIAGNOSIS — R600 Localized edema: Secondary | ICD-10-CM

## 2019-09-04 DIAGNOSIS — Z170 Estrogen receptor positive status [ER+]: Secondary | ICD-10-CM

## 2019-09-04 DIAGNOSIS — R293 Abnormal posture: Secondary | ICD-10-CM

## 2019-09-04 DIAGNOSIS — C50312 Malignant neoplasm of lower-inner quadrant of left female breast: Secondary | ICD-10-CM

## 2019-09-04 DIAGNOSIS — M25511 Pain in right shoulder: Secondary | ICD-10-CM | POA: Diagnosis not present

## 2019-09-04 DIAGNOSIS — M25611 Stiffness of right shoulder, not elsewhere classified: Secondary | ICD-10-CM

## 2019-09-04 DIAGNOSIS — Z483 Aftercare following surgery for neoplasm: Secondary | ICD-10-CM | POA: Diagnosis not present

## 2019-09-04 NOTE — Therapy (Signed)
[place], [place], [address] Phone: [phone]   Fax:  [phone]  Physical Therapy Treatment  Patient Details  Name: Kathryn Webb MRN: [id number] Date of Birth: 09-Jul-1961 Referring Provider (PT): [name]   Encounter Date: 09/04/2019  PT End of Session - 09/04/19 1406    Visit Number  9    Number of Visits  14    Date for PT Re-Evaluation  09/22/19    PT Start Time  1401    PT Stop Time  1458    PT Time Calculation (min)  57 min    Activity Tolerance  Patient tolerated treatment well    Behavior During Therapy  [facility] for tasks assessed/performed       Past Medical History:  Diagnosis Date  . Acute medial meniscus tear   . Arthritis   . Cancer ([place]) 06/2019   left breast IDC  . Diabetes mellitus without complication ([place])   . GERD (gastroesophageal reflux disease)   . Hidradenitis suppurativa   . Serum lipids high     Past Surgical History:  Procedure Laterality Date  . AXILLARY HIDRADENITIS EXCISION    . BREAST LUMPECTOMY WITH RADIOACTIVE SEED AND SENTINEL LYMPH NODE BIOPSY Left 07/29/2019   Procedure: LEFT BREAST LUMPECTOMY WITH RADIOACTIVE SEED AND LEFT AXILLARY SENTINEL LYMPH NODE BIOPSY;  Surgeon: Alphonsa Overall, MD;  Location: [facility];  Service: General;  Laterality: Left;  . CHONDROPLASTY Right 01/24/2017   Procedure: CHONDROPLASTY PATELLA  FEMORAL JOINT;  Surgeon: Dorna Leitz, MD;  Location: [facility];  Service: Orthopedics;  Laterality: Right;  . KNEE ARTHROSCOPY WITH LATERAL MENISECTOMY Right 01/24/2017   Procedure: KNEE ARTHROSCOPY WITH LATERAL MENISECTOMY/ PARTIAL;  Surgeon: Dorna Leitz, MD;  Location: [facility];  Service: Orthopedics;  Laterality: Right;  . KNEE ARTHROSCOPY WITH MEDIAL MENISECTOMY Right 01/24/2017   Procedure: ARTHROSCOPY KNEE, PARTIAL MEDIAL  MENISCECTOMY,;  Surgeon: Dorna Leitz, MD;  Location: [facility];  Service: Orthopedics;  Laterality: Right;  . PERINEAL HIDRADENITIS EXCISION      There were no vitals filed for this visit.  Subjective Assessment - 09/04/19 1404    Subjective  Pt states she feels no better and no worse. She still gets a pain in her R shoulder when she is moving her arm a certain way and reaching.    Pertinent History  Patient was diagnosed on 06/11/2019 with left grade II invasive ductal carcinoma breast cancer. Patient underwent a left lumpectomy on 07/29/2019 with 1 negative lymph node removed. It is ER/PR positive and HER2 negative with a Ki67 of 15%. She had a right total knee replacement in 2018.    Patient Stated Goals  Make sure my left arm is ok. Decrease pain in right shoulder.    Currently in Pain?  Yes    Pain Score  3     Pain Location  Shoulder    Pain Orientation  Right    Pain Descriptors / Indicators  Aching;Throbbing    Pain Type  Chronic pain    Pain Onset  More than a month ago    Aggravating Factors   reaching, moving her arm a certain way    Pain Relieving Factors  rest    Multiple Pain Sites  No                       [facility] Adult PT Treatment/Exercise - 09/04/19  0001      Shoulder Exercises: Standing   External Rotation  Strengthening;Both;10 reps;Theraband    Theraband Level (Shoulder External Rotation)  Level 1 (Yellow)    External Rotation Limitations  1 step out for reactive isometric 10x w/VC to prevent elbow extension and internal rotation Bil    Internal Rotation  Strengthening;Both;10 reps;Theraband    Theraband Level (Shoulder Internal Rotation)  Level 1 (Yellow)    Internal Rotation Limitations  2 step out for reactive isometric 10x w/VC to prevent external rotation and elbow flexion for compensation    Extension  Strengthening;Both;10 reps    Theraband Level (Shoulder Extension)  Level 1 (Yellow)    Extension Limitations  VC to maintain elbow extension and tactile cueing for direction of scapula to  address lower trapezius    Row  Strengthening;Both;10 reps    Theraband Level (Shoulder Row)  Level 1 (Yellow)    Row Limitations  VC for scapular retraction w/tactile cueing and to decrease B shoulder hiking for targeting the middle trapezius and rhomboids.     Other Standing Exercises  Seated Bruegger's postural exercise with external roation and band anchored in an x pattern over the knees for postural lower/middle trapezius and external rotators of the shoulder Bil 10x in sitting      Manual Therapy   Manual Therapy  Manual Lymphatic Drainage (MLD);Taping    Manual therapy comments  taping for edema at the L breast with ends toward the axillo-inguinal anastomosis and L axilla following MLD.     Manual Lymphatic Drainage (MLD)  MLD for the R breast with step by step instructions and pt performing following each movement with 5 deep breathes followed by stimulation of R axillary lymph nodes and L inguinal lymph nodes, anterior inter-axillary anastomosis to the R, axillo-inguinal anastomosis on the L,superior breast toward inter-axillary anastomosis and inferior breast toward L aillo-inguinal anastomosis followed by re-working all surfaces.              PT Education - 09/04/19 1500    Education Details  Access Code: [email or web address]    Person(s) Educated  Patient    Methods  Explanation;Demonstration;Tactile cues;Verbal cues;Handout    Comprehension  Verbalized understanding;Returned demonstration       PT Short Term Goals - 08/25/19 1228      PT SHORT TERM GOAL #1   Title  Patient will be independent with a HEP for right shoulder ROM and posture.    Time  4    Period  Weeks    Status  Achieved      PT SHORT TERM GOAL #2   Title  Patient will improve DASH score to </= 11.36 for improved overall shoulder function on the right side.    Baseline  47.73 at re-eval 08/25/2019    Time  4    Period  Weeks    Status  New      PT SHORT TERM GOAL #3   Title  Patient will report >/= 40% less  right shoulder pain with daily tasks including household tasks and work tasks.    Time  4    Period  Weeks    Status  Revised      PT SHORT TERM GOAL #4   Title  Patient will report >/= 25% less difficulty donning and doffing her sports bra.    Time  4    Period  Weeks    Status  On-going      PT SHORT TERM GOAL #5  Title  Patient will increase right shoulder strength to >/= 4+/5 for improved ability to perform daily tasks.    Time  4    Period  Weeks    Status  On-going        PT Long Term Goals - 08/25/19 1231      PT LONG TERM GOAL #1   Title  Patient will demonstrate she has regained full left shoulder ROM and function post operatively compared to baselines.    Time  8    Period  Weeks    Status  Achieved            Plan - 09/04/19 1405    Clinical Impression Statement  Pt presents today with continued pain in her R shoulder with AROM elevation against gravity. She continues with hematoma in the superior/medial L breast and was taped for edema with kinesiotape this session. Isometric shoulder ther-ex was initiated this session w/o an increase in pain from baseline. Pt will benefit from continued POC.    Stability/Clinical Decision Making  Stable/Uncomplicated    Rehab Potential  Excellent    PT Frequency  2x / week    PT Duration  4 weeks    PT Treatment/Interventions  ADLs/Self Care Home Management;Therapeutic exercise;Patient/family education;Neuromuscular re-education;Manual techniques;Passive range of motion;Taping;Manual lymph drainage;Moist Heat;Cryotherapy;Iontophoresis 40m/ml Dexamethasone;Joint Manipulations    Consulted and Agree with Plan of Care  Patient       Patient will benefit from skilled therapeutic intervention in order to improve the following deficits and impairments:  Decreased knowledge of precautions, Pain, Impaired UE functional use, Decreased strength, Postural dysfunction, Decreased range of motion, Increased edema  Visit  Diagnosis: Abnormal posture  Chronic right shoulder pain  Aftercare following surgery for neoplasm  Localized edema  Stiffness of right shoulder, not elsewhere classified  Malignant neoplasm of lower-inner quadrant of left breast in female, estrogen receptor positive ([place]     Problem List Patient Active Problem List   Diagnosis Date Noted  . Malignant neoplasm of upper-inner quadrant of left breast in female, estrogen receptor positive ([place] 07/04/2019  . Acute medial meniscal tear, right, initial encounter 01/24/2017  . Chondromalacia patellae, right knee 01/24/2017  . Acute lateral meniscal tear, right, initial encounter 01/24/2017  . Hyperlipidemia LDL goal < 100 03/11/2014  . Dysuria 03/11/2014  . Tobacco abuse 03/11/2014  . Routine general medical examination at a health care facility 03/11/2014  . Hypercalcemia 03/11/2014  . Need for pneumococcal vaccination 03/11/2014  . Other and unspecified hyperlipidemia 03/14/2013  . Diabetes mellitus type 2 in obese ([facility] 03/14/2013    Kathryn Webb PT 09/04/2019, 3:25 PM  [place] [address]Phone: [phone]  Fax:  [phone] Name: Kathryn FrymireMRN: [id number] of Birth: 925-Jun-1962

## 2019-09-04 NOTE — Patient Instructions (Signed)
Access Code: [email or web address]  URL: [email or web address]  Date: 09/04/2019  Prepared by: Tomma Rakers   Exercises Shoulder External Rotation Reactive Isometrics - 10 reps - 1 sets - 1x daily - 7x weekly Shoulder Internal Rotation Reactive Isometrics - 10 reps - 1 sets - 1x daily - 7x weekly Standing Shoulder Row with Anchored Resistance - 10 reps - 1 sets - 1x daily - 7x weekly Single Arm Shoulder Extension with Anchored Resistance - 10 reps - 1 sets - 1x daily - 7x weekly Seated Bilateral Shoulder External Rotation with Resistance - 10 reps - 1 sets - 1x daily - 7x weekly

## 2019-09-05 ENCOUNTER — Encounter

## 2019-09-08 ENCOUNTER — Other Ambulatory Visit: Payer: Self-pay

## 2019-09-08 ENCOUNTER — Ambulatory Visit
Admission: RE | Admit: 2019-09-08 | Discharge: 2019-09-08 | Disposition: A | Discharge status: Home / Self Care | Payer: BC Managed Care – PPO | Source: Ambulatory Visit | Attending: Radiation Oncology | Admitting: Radiation Oncology

## 2019-09-08 DIAGNOSIS — C50212 Malignant neoplasm of upper-inner quadrant of left female breast: Secondary | ICD-10-CM

## 2019-09-08 DIAGNOSIS — Z170 Estrogen receptor positive status [ER+]: Secondary | ICD-10-CM | POA: Diagnosis not present

## 2019-09-08 DIAGNOSIS — Z510 Encounter for antineoplastic radiation therapy: Secondary | ICD-10-CM | POA: Diagnosis not present

## 2019-09-08 NOTE — Progress Notes (Signed)
Radiation Oncology         [phone] ________________________________  Name: Quaniya Damas MRN: [id number]  Date: 09/08/2019  DOB: November 07, 1961  SIMULATION AND TREATMENT PLANNING NOTE    ICD-10-CM   1. Malignant neoplasm of upper-inner quadrant of left breast in female, estrogen receptor positive ([place])  C50.212    Z17.0     DIAGNOSIS:  Stage IA (T1c, N0) Left Breast UIQ, Invasive Ductal Carcinoma with DCIS, ER+ / PR+ / Her2-, Grade 2  NARRATIVE:  The patient was brought to the [facility].  Identity was confirmed.  All relevant records and images related to the planned course of therapy were reviewed.  The patient freely provided informed written consent to proceed with treatment after reviewing the details related to the planned course of therapy. The consent form was witnessed and verified by the simulation staff.  Then, the patient was set-up in a stable reproducible  supine position for radiation therapy.  CT images were obtained.  Surface markings were placed.  The CT images were loaded into the planning software.  Then the target and avoidance structures were contoured.  Treatment planning then occurred.  The radiation prescription was entered and confirmed.  Then, I designed and supervised the construction of a total of 5 medically necessary complex treatment devices.  I have requested : 3D Simulation  I have requested a DVH of the following structures: Lumpectomy cavity, heart, lungs.  I have ordered:dose calc.  PLAN:  The patient will receive 50.4 Gy in 28 fractions directed at the left breast.  The patient will then proceed with a boost to the lumpectomy cavity of 10 Gray in 5 fractions for cumulative dose of 60.4 Gray.  -----------------------------------   Optical Surface Tracking Plan:  Since intensity modulated radiotherapy (IMRT) and 3D conformal radiation treatment methods are predicated on accurate and precise positioning for treatment, intrafraction  motion monitoring is medically necessary to ensure accurate and safe treatment delivery.  The ability to quantify intrafraction motion without excessive ionizing radiation dose can only be performed with optical surface tracking. Accordingly, surface imaging offers the opportunity to obtain 3D measurements of patient position throughout IMRT and 3D treatments without excessive radiation exposure.  I am ordering optical surface tracking for this patient's upcoming course of radiotherapy.   Special treatment procedure was performed today due to the extra time and effort required by myself to plan and prepare this patient for deep inspiration breath hold technique.  I have determined cardiac sparing to be of benefit to this patient to prevent long term cardiac damage due to radiation of the heart.  Bellows were placed on the patient's abdomen. To facilitate cardiac sparing, the patient was coached by the radiation therapists on breath hold techniques and breathing practice was performed. Practice waveforms were obtained. The patient was then scanned while maintaining breath hold in the treatment position.  This image was then transferred over to the imaging specialist. The imaging specialist then created a fusion of the free breathing and breath hold scans using the chest wall as the stable structure. I personally reviewed the fusion in axial, coronal and sagittal image planes.  Excellent cardiac sparing was obtained.  I felt the patient is an appropriate candidate for breath hold and the patient will be treated as such.  The image fusion was then reviewed with the patient to reinforce the necessity of reproducible breath hold.  ________________________________       Blair Promise, PhD, MD  This  document serves as a record of services personally performed by Gery Pray, MD. It was created on his behalf by Wilburn Mylar, a trained medical scribe. The creation of this record is based on the scribe's  personal observations and the provider's statements to them. This document has been checked and approved by the attending provider.

## 2019-09-10 ENCOUNTER — Telehealth: Payer: Self-pay | Admitting: Hematology and Oncology

## 2019-09-10 NOTE — Telephone Encounter (Signed)
Called pt per 10/20 sch message- unable to reach pt . Left message with appt date and time

## 2019-09-11 ENCOUNTER — Ambulatory Visit: Payer: BC Managed Care – PPO | Admitting: Physical Therapy

## 2019-09-11 ENCOUNTER — Other Ambulatory Visit: Payer: Self-pay

## 2019-09-11 DIAGNOSIS — R600 Localized edema: Secondary | ICD-10-CM

## 2019-09-11 DIAGNOSIS — Z483 Aftercare following surgery for neoplasm: Secondary | ICD-10-CM

## 2019-09-11 DIAGNOSIS — M25611 Stiffness of right shoulder, not elsewhere classified: Secondary | ICD-10-CM | POA: Diagnosis not present

## 2019-09-11 DIAGNOSIS — M25511 Pain in right shoulder: Secondary | ICD-10-CM

## 2019-09-11 DIAGNOSIS — G8929 Other chronic pain: Secondary | ICD-10-CM | POA: Diagnosis not present

## 2019-09-11 DIAGNOSIS — R293 Abnormal posture: Secondary | ICD-10-CM | POA: Diagnosis not present

## 2019-09-11 DIAGNOSIS — Z170 Estrogen receptor positive status [ER+]: Secondary | ICD-10-CM | POA: Diagnosis not present

## 2019-09-11 DIAGNOSIS — C50312 Malignant neoplasm of lower-inner quadrant of left female breast: Secondary | ICD-10-CM | POA: Diagnosis not present

## 2019-09-11 DIAGNOSIS — C50212 Malignant neoplasm of upper-inner quadrant of left female breast: Secondary | ICD-10-CM | POA: Diagnosis not present

## 2019-09-11 NOTE — Therapy (Signed)
[place], [place], [address] Phone: [phone]   Fax:  [phone]  Physical Therapy Treatment  Patient Details  Name: Kathryn Webb MRN: [id number] Date of Birth: [birth year] Referring Provider (PT): [name]   Encounter Date: 09/11/2019  PT End of Session - 09/11/19 1558    Visit Number  11    Number of Visits  14    Date for PT Re-Evaluation  09/22/19    PT Start Time  1500    PT Stop Time  1545    PT Time Calculation (min)  45 min    Activity Tolerance  Patient tolerated treatment well    Behavior During Therapy  [facility] for tasks assessed/performed       Past Medical History:  Diagnosis Date  . Acute medial meniscus tear   . Arthritis   . Cancer ([place]) 06/2019   left breast IDC  . Diabetes mellitus without complication ([place])   . GERD (gastroesophageal reflux disease)   . Hidradenitis suppurativa   . Serum lipids high     Past Surgical History:  Procedure Laterality Date  . AXILLARY HIDRADENITIS EXCISION    . BREAST LUMPECTOMY WITH RADIOACTIVE SEED AND SENTINEL LYMPH NODE BIOPSY Left 07/29/2019   Procedure: LEFT BREAST LUMPECTOMY WITH RADIOACTIVE SEED AND LEFT AXILLARY SENTINEL LYMPH NODE BIOPSY;  Surgeon: Alphonsa Overall, MD;  Location: [place];  Service: General;  Laterality: Left;  . CHONDROPLASTY Right 01/24/2017   Procedure: CHONDROPLASTY PATELLA  FEMORAL JOINT;  Surgeon: Dorna Leitz, MD;  Location: [place];  Service: Orthopedics;  Laterality: Right;  . KNEE ARTHROSCOPY WITH LATERAL MENISECTOMY Right 01/24/2017   Procedure: KNEE ARTHROSCOPY WITH LATERAL MENISECTOMY/ PARTIAL;  Surgeon: Dorna Leitz, MD;  Location: [place];  Service: Orthopedics;  Laterality: Right;  . KNEE ARTHROSCOPY WITH MEDIAL MENISECTOMY Right 01/24/2017   Procedure: ARTHROSCOPY KNEE, PARTIAL MEDIAL  MENISCECTOMY,;  Surgeon: Dorna Leitz, MD;  Location: [place];  Service: Orthopedics;  Laterality: Right;  . PERINEAL HIDRADENITIS EXCISION      There were no vitals filed for this visit.  Subjective Assessment - 09/11/19 1554    Subjective  Pt is sitll having pain in her right shoulder.  Pt states she thinks it might always be this way.  She feels like she knows what exercises she needs to do and that she has really tried to help it. She may call Dr. Berenice Primas to see what he would recommend She starts radiation on Tuesday.  She is still having the hematoma in her breast but feels it is getting smaller.    Pertinent History  Patient was diagnosed on 06/11/2019 with left grade II invasive ductal carcinoma breast cancer. Patient underwent a left lumpectomy on 07/29/2019 with 1 negative lymph node removed. It is ER/PR positive and HER2 negative with a Ki67 of 15%. She had a right total knee replacement in 2018.    Patient Stated Goals  Make sure my left arm is ok. Decrease pain in right shoulder.    Currently in Pain?  No/denies   no pain at rest, but pain with movment of right shoulder                      [facility] Adult PT Treatment/Exercise - 09/11/19 0001      Manual Therapy   Manual Therapy  Manual Lymphatic Drainage (MLD);Taping    Edema Management  provded  a piece of small dotted foam to wear in bra     Manual Lymphatic Drainage (MLD)  MLD for the R breast with step by step instructions and pt performing following each movement with 5 deep breathes followed by stimulation of R axillary lymph nodes and L inguinal lymph nodes, anterior inter-axillary anastomosis to the R, axillo-inguinal anastomosis on the L,superior breast toward inter-axillary anastomosis and inferior breast toward L aillo-inguinal anastomosis followed by re-working all surfaces.     Kinesiotex  Edema      Kinesiotix   Edema  I band of tape with slits cut in it to lift skin to help with resorption of hematoma.                PT Short Term Goals -  09/11/19 1516      PT SHORT TERM GOAL #1   Title  Patient will be independent with a HEP for right shoulder ROM and posture.    Status  Achieved      PT SHORT TERM GOAL #2   Title  Patient will improve DASH score to </= 11.36 for improved overall shoulder function on the right side.    Baseline  47.73 at re-eval 08/25/2019      PT SHORT TERM GOAL #3   Title  Patient will report >/= 40% less right shoulder pain with daily tasks including household tasks and work tasks.    Baseline  pt still has shoulder  pain      PT SHORT TERM GOAL #4   Title  Patient will report >/= 25% less difficulty donning and doffing her sports bra.        PT Long Term Goals - 09/11/19 1515      PT LONG TERM GOAL #1   Title  Patient will demonstrate she has regained full left shoulder ROM and function post operatively compared to baselines.    Status  Achieved            Plan - 09/11/19 1558    Clinical Impression Statement  Pt does not feel that therapy to her right shoulder is helping so she wants to focus on her breast lymphedema and hematoma. She will start radiation therapy on Tuesday so wants Monday to be her last session    PT Frequency  2x / week    PT Duration  4 weeks    PT Treatment/Interventions  ADLs/Self Care Home Management;Therapeutic exercise;Patient/family education;Neuromuscular re-education;Manual techniques;Passive range of motion;Taping;Manual lymph drainage;Moist Heat;Cryotherapy;Iontophoresis 85m/ml Dexamethasone;Joint Manipulations    PT Next Visit Plan  MLD to left breast, assess goals and Discharge    Consulted and Agree with Plan of Care  Patient       Patient will benefit from skilled therapeutic intervention in order to improve the following deficits and impairments:  Decreased knowledge of precautions, Pain, Impaired UE functional use, Decreased strength, Postural dysfunction, Decreased range of motion, Increased edema  Visit Diagnosis: Chronic right shoulder  pain  Aftercare following surgery for neoplasm  Localized edema     Problem List Patient Active Problem List   Diagnosis Date Noted  . 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 Acute medial meniscal tear, right, initial encounter 01/24/2017  . Chondromalacia patellae, right knee 01/24/2017  . Acute lateral meniscal tear, right, initial encounter 01/24/2017  . Hyperlipidemia LDL goal < 100 03/11/2014  . Dysuria 03/11/2014  . Tobacco abuse 03/11/2014  . Routine general medical examination  at a health care facility 03/11/2014  . Hypercalcemia 03/11/2014  . Need for pneumococcal vaccination 03/11/2014  . Other and unspecified hyperlipidemia 03/14/2013  . Diabetes mellitus type 2 in obese ([place]) 03/14/2013   Donato Heinz. Owens Shark PT  Norwood Levo 09/11/2019, 4:01 PM  [place], [place], [address] Phone: [phone]   Fax:  [phone]  Name: Kathryn Webb MRN: [id number] Date of Birth: 09-05-1961

## 2019-09-12 DIAGNOSIS — C50212 Malignant neoplasm of upper-inner quadrant of left female breast: Secondary | ICD-10-CM | POA: Diagnosis not present

## 2019-09-15 ENCOUNTER — Ambulatory Visit: Payer: BC Managed Care – PPO

## 2019-09-15 ENCOUNTER — Other Ambulatory Visit: Payer: Self-pay

## 2019-09-15 ENCOUNTER — Ambulatory Visit: Payer: BC Managed Care – PPO | Admitting: Radiation Oncology

## 2019-09-15 DIAGNOSIS — M25511 Pain in right shoulder: Secondary | ICD-10-CM | POA: Diagnosis not present

## 2019-09-15 DIAGNOSIS — Z483 Aftercare following surgery for neoplasm: Secondary | ICD-10-CM

## 2019-09-15 DIAGNOSIS — R600 Localized edema: Secondary | ICD-10-CM

## 2019-09-15 DIAGNOSIS — G8929 Other chronic pain: Secondary | ICD-10-CM

## 2019-09-15 DIAGNOSIS — R293 Abnormal posture: Secondary | ICD-10-CM | POA: Diagnosis not present

## 2019-09-15 DIAGNOSIS — C50312 Malignant neoplasm of lower-inner quadrant of left female breast: Secondary | ICD-10-CM | POA: Diagnosis not present

## 2019-09-15 DIAGNOSIS — M25611 Stiffness of right shoulder, not elsewhere classified: Secondary | ICD-10-CM | POA: Diagnosis not present

## 2019-09-15 DIAGNOSIS — Z170 Estrogen receptor positive status [ER+]: Secondary | ICD-10-CM | POA: Diagnosis not present

## 2019-09-15 NOTE — Patient Instructions (Addendum)
Self manual lymph drainage:    Cancer Rehab [id number] Perform this sequence once a day.  Only give enough pressure no your skin to make the skin move. Hug yourself.  Do circles at your neck just above your collarbones.  Repeat this 10 times. Diaphragmatic - Supine   Inhale through nose making navel move out toward hands. Exhale through puckered lips, hands follow navel in. Repeat _5__ times. Rest _10__ seconds between repeats.   Axilla - One at a Time   Using full weight of flat hand and fingers at center of uninvolved armpit, make _10__ in-place circles.   Copyright  VHI. All rights reserved.  LEG: Inguinal Nodes Stimulation Self manual lymph drainage: Perform this sequence once a day.  Only give enough pressure no your skin to make the skin move.  Diaphragmatic - Supine   Inhale through nose making navel move out toward hands. Exhale through puckered lips, hands follow navel in. Repeat _5__ times. Rest _10__ seconds between repeats.   Copyright  VHI. All rights reserved.  Hug yourself.  Do circles at your neck just above your collarbones.  Repeat this 10 times.  Axilla - One at a Time   Using full weight of flat hand and fingers at center of uninvolved armpit, make _10__ in-place circles.   Copyright  VHI. All rights reserved.  LEG: Inguinal Nodes Stimulation   With small finger side of hand against hip crease on involved side, gently perform circles at the crease. Repeat __10_ times.   Copyright  VHI. All rights reserved.  1) Axilla to Inguinal Nodes - Sweep   ((CAN HOLD ON THIS UNLESS SWELLING IN BREAST GETS WORSE))   On involved side, sweep _4__ times from armpit along side of trunk to hip crease.  Now gently stretch skin from the involved side to the uninvolved side across the chest at the shoulder line.  Repeat that 4 times.  Draw an imaginary diagonal line from upper outer breast through the nipple area toward lower inner breast.  Direct fluid upward and  inward from this line toward the pathway across your upper chest .  Do this in three rows to treat all of the upper inner breast tissue, and do each row 3-4x.   ((   Direct fluid to treat all of lower outer breast tissue downward and outward toward pathway that is aimed at the left groin. )) CAN ONLY DO THIS IF SWELLING PROGRESSES INTO BREAST  Finish by doing the pathways as described above going from your involved armpit to the same side groin and going across your upper chest from the involved shoulder to the uninvolved shoulder.  Repeat the steps above where you do circles in your left groin and right armpit. Copyright  VHI. All rights reserved.

## 2019-09-15 NOTE — Therapy (Addendum)
[place], [place], [address] Phone: [phone]   Fax:  [phone]  Physical Therapy Discharge Note  Patient Details  Name: Kathryn Webb MRN: [id number] Date of Birth: [birth year] Referring Provider (PT): Dr. Alphonsa Webb   Encounter Date: 09/15/2019  PT End of Session - 09/15/19 1557    Visit Number  12    Number of Visits  14    Date for PT Re-Evaluation  09/22/19    PT Start Time  7654    PT Stop Time  1356    PT Time Calculation (min)  54 min    Activity Tolerance  Patient tolerated treatment well    Behavior During Therapy  [facility] for tasks assessed/performed       Past Medical History:  Diagnosis Date  . Acute medial meniscus tear   . Arthritis   . Cancer ([place]) 06/2019   left breast IDC  . Diabetes mellitus without complication ([place])   . GERD (gastroesophageal reflux disease)   . Hidradenitis suppurativa   . Serum lipids high     Past Surgical History:  Procedure Laterality Date  . AXILLARY HIDRADENITIS EXCISION    . BREAST LUMPECTOMY WITH RADIOACTIVE SEED AND SENTINEL LYMPH NODE BIOPSY Left 07/29/2019   Procedure: LEFT BREAST LUMPECTOMY WITH RADIOACTIVE SEED AND LEFT AXILLARY SENTINEL LYMPH NODE BIOPSY;  Surgeon: Kathryn Overall, MD;  Location: [place];  Service: General;  Laterality: Left;  . CHONDROPLASTY Right 01/24/2017   Procedure: CHONDROPLASTY PATELLA  FEMORAL JOINT;  Surgeon: Kathryn Leitz, MD;  Location: [place];  Service: Orthopedics;  Laterality: Right;  . KNEE ARTHROSCOPY WITH LATERAL MENISECTOMY Right 01/24/2017   Procedure: KNEE ARTHROSCOPY WITH LATERAL MENISECTOMY/ PARTIAL;  Surgeon: Kathryn Leitz, MD;  Location: [place];  Service: Orthopedics;  Laterality: Right;  . KNEE ARTHROSCOPY WITH MEDIAL MENISECTOMY Right 01/24/2017   Procedure: ARTHROSCOPY KNEE, PARTIAL MEDIAL  MENISCECTOMY,;  Surgeon: Kathryn Leitz, MD;  Location: [place];  Service: Orthopedics;  Laterality: Right;  . PERINEAL HIDRADENITIS EXCISION      There were no vitals filed for this visit.  Subjective Assessment - 09/15/19 1306    Subjective  I am ready to make today my last visit. I start radiation tomorrow and I can tell my Lt breast is some better. I need to be better at the massage though.    Pertinent History  Patient was diagnosed on 06/11/2019 with left grade II invasive ductal carcinoma breast cancer. Patient underwent a left lumpectomy on 07/29/2019 with 1 negative lymph node removed. It is ER/PR positive and HER2 negative with a Ki67 of 15%. She had a right total knee replacement in 2018.    Patient Stated Goals  Make sure my left arm is ok. Decrease pain in right shoulder.    Currently in Pain?  No/denies                       [facility] Adult PT Treatment/Exercise - 09/15/19 0001      Manual Therapy   Manual Lymphatic Drainage (MLD)  MLD for the Lt breast with step by step instructions and pt performing following each movement with 5 deep breathes followed by stimulation of R axillary lymph nodes and L inguinal lymph nodes, anterior inter-axillary anastomosis to the R, axillo-inguinal anastomosis on the L,superior breast toward inter-axillary anastomosis and inferior breast toward L aillo-inguinal anastomosis followed by re-working all surfaces; hand  over hand technique used at times.             PT Education - 09/15/19 1556    Education Details  Self MLD for Lt breast    Person(s) Educated  Patient    Methods  Explanation;Demonstration;Verbal cues;Handout    Comprehension  Verbalized understanding;Returned demonstration;Tactile cues required       PT Short Term Goals - 09/15/19 1602      PT SHORT TERM GOAL #1   Title  Patient will be independent with a HEP for right shoulder ROM and posture.    Status  Achieved      PT SHORT TERM GOAL #2   Title  Patient will improve DASH score to </= 11.36 for  improved Webb shoulder function on the right side.    Baseline  47.73 at re-eval 08/25/2019; unchanged    Status  Not Met      PT SHORT TERM GOAL #3   Title  Patient will report >/= 40% less right shoulder pain with daily tasks including household tasks and work tasks.    Baseline  pt still has shoulder  pain; pain unchanged, pt plans on seeing an orthopedist - 09/15/19    Status  Not Met      PT SHORT TERM GOAL #4   Title  Patient will report >/= 25% less difficulty donning and doffing her sports bra.    Status  Not Met      PT SHORT TERM GOAL #5   Title  Patient will increase right shoulder strength to >/= 4+/5 for improved ability to perform daily tasks.    Baseline  unchanged    Status  Not Met        PT Long Term Goals - 09/15/19 1601      PT LONG TERM GOAL #1   Title  Patient will demonstrate she has regained full left shoulder ROM and function post operatively compared to baselines.    Status  Achieved            Plan - 09/15/19 1603    Clinical Impression Statement  Pt is ready for D/C today. She did not have any improvement with her Rt shoulder pain and plans on following up with her doctor for that. She does report good improvement with her Lt breast swelling, up to 85% improved and is very pleased with that. Also reviewed self manual lymph drainage to her Lt breast and issued handout for this. Pt begins radiation tomorrow.    Stability/Clinical Decision Making  Stable/Uncomplicated    Rehab Potential  Excellent    PT Frequency  2x / week    PT Duration  4 weeks    PT Treatment/Interventions  ADLs/Self Care Home Management;Therapeutic exercise;Patient/family education;Neuromuscular re-education;Manual techniques;Passive range of motion;Taping;Manual lymph drainage;Moist Heat;Cryotherapy;Iontophoresis 59m/ml Dexamethasone;Joint Manipulations    PT Next Visit Plan  D/C this visit.    PT Home Exercise Plan  [place] with current HEP; self MLD for  Lt breast    Consulted and Agree with Plan of Care  Patient       Patient will benefit from skilled therapeutic intervention in order to improve the following deficits and impairments:  Decreased knowledge of precautions, Pain, Impaired UE functional use, Decreased strength, Postural dysfunction, Decreased range of motion, Increased edema  Visit Diagnosis: Chronic right shoulder pain  Aftercare following surgery for neoplasm  Localized edema     Problem List Patient Active Problem List   Diagnosis Date Noted  .  Malignant neoplasm of upper-inner quadrant of left breast in female, estrogen receptor positive ([place]) 07/04/2019  . Acute medial meniscal tear, right, initial encounter 01/24/2017  . Chondromalacia patellae, right knee 01/24/2017  . Acute lateral meniscal tear, right, initial encounter 01/24/2017  . Hyperlipidemia LDL goal < 100 03/11/2014  . Dysuria 03/11/2014  . Tobacco abuse 03/11/2014  . Routine general medical examination at a health care facility 03/11/2014  . Hypercalcemia 03/11/2014  . Need for pneumococcal vaccination 03/11/2014  . Other and unspecified hyperlipidemia 03/14/2013  . Diabetes mellitus type 2 in obese ([place]) 03/14/2013   PHYSICAL THERAPY DISCHARGE SUMMARY   Plan: Patient agrees to discharge.  Patient goals were not met. Patient is being discharged due to the patient's request.  ?????    Tomma Rakers, PT 09/16/19 10:28 AM  Otelia Limes, PTA 09/15/2019, 4:08 PM  [place], [place], [address] Phone: [phone]   Fax:  [phone]  Name: Kathryn Webb MRN: [id number] Date of Birth: 10-04-1961

## 2019-09-16 ENCOUNTER — Other Ambulatory Visit: Payer: Self-pay

## 2019-09-16 ENCOUNTER — Ambulatory Visit
Admission: RE | Admit: 2019-09-16 | Discharge: 2019-09-16 | Disposition: A | Discharge status: Home / Self Care | Payer: BC Managed Care – PPO | Source: Ambulatory Visit | Attending: Radiation Oncology | Admitting: Radiation Oncology

## 2019-09-16 DIAGNOSIS — C50212 Malignant neoplasm of upper-inner quadrant of left female breast: Secondary | ICD-10-CM | POA: Diagnosis not present

## 2019-09-17 ENCOUNTER — Ambulatory Visit
Admission: RE | Admit: 2019-09-17 | Discharge: 2019-09-17 | Disposition: A | Discharge status: Home / Self Care | Payer: BC Managed Care – PPO | Source: Ambulatory Visit | Attending: Radiation Oncology | Admitting: Radiation Oncology

## 2019-09-17 ENCOUNTER — Other Ambulatory Visit: Payer: Self-pay

## 2019-09-17 DIAGNOSIS — C50212 Malignant neoplasm of upper-inner quadrant of left female breast: Secondary | ICD-10-CM | POA: Diagnosis not present

## 2019-09-18 ENCOUNTER — Other Ambulatory Visit: Payer: Self-pay

## 2019-09-18 ENCOUNTER — Ambulatory Visit
Admission: RE | Admit: 2019-09-18 | Discharge: 2019-09-18 | Disposition: A | Discharge status: Home / Self Care | Payer: BC Managed Care – PPO | Source: Ambulatory Visit | Attending: Radiation Oncology | Admitting: Radiation Oncology

## 2019-09-18 ENCOUNTER — Encounter: Payer: Self-pay | Admitting: Physical Therapy

## 2019-09-18 DIAGNOSIS — C50212 Malignant neoplasm of upper-inner quadrant of left female breast: Secondary | ICD-10-CM | POA: Diagnosis not present

## 2019-09-19 ENCOUNTER — Ambulatory Visit
Admission: RE | Admit: 2019-09-19 | Discharge: 2019-09-19 | Disposition: A | Discharge status: Home / Self Care | Payer: BC Managed Care – PPO | Source: Ambulatory Visit | Attending: Radiation Oncology | Admitting: Radiation Oncology

## 2019-09-19 ENCOUNTER — Other Ambulatory Visit: Payer: Self-pay

## 2019-09-19 DIAGNOSIS — C50212 Malignant neoplasm of upper-inner quadrant of left female breast: Secondary | ICD-10-CM | POA: Diagnosis not present

## 2019-09-22 ENCOUNTER — Ambulatory Visit
Admission: RE | Admit: 2019-09-22 | Discharge: 2019-09-22 | Disposition: A | Discharge status: Home / Self Care | Payer: BC Managed Care – PPO | Source: Ambulatory Visit | Attending: Radiation Oncology | Admitting: Radiation Oncology

## 2019-09-22 ENCOUNTER — Other Ambulatory Visit: Payer: Self-pay

## 2019-09-22 DIAGNOSIS — Z170 Estrogen receptor positive status [ER+]: Secondary | ICD-10-CM | POA: Diagnosis present

## 2019-09-22 DIAGNOSIS — C50212 Malignant neoplasm of upper-inner quadrant of left female breast: Secondary | ICD-10-CM | POA: Insufficient documentation

## 2019-09-22 DIAGNOSIS — Z510 Encounter for antineoplastic radiation therapy: Secondary | ICD-10-CM | POA: Diagnosis not present

## 2019-09-23 ENCOUNTER — Other Ambulatory Visit: Payer: Self-pay

## 2019-09-23 ENCOUNTER — Ambulatory Visit
Admission: RE | Admit: 2019-09-23 | Discharge: 2019-09-23 | Disposition: A | Discharge status: Home / Self Care | Payer: BC Managed Care – PPO | Source: Ambulatory Visit | Attending: Radiation Oncology | Admitting: Radiation Oncology

## 2019-09-23 DIAGNOSIS — C50212 Malignant neoplasm of upper-inner quadrant of left female breast: Secondary | ICD-10-CM | POA: Diagnosis not present

## 2019-09-24 ENCOUNTER — Ambulatory Visit
Admission: RE | Admit: 2019-09-24 | Discharge: 2019-09-24 | Disposition: A | Discharge status: Home / Self Care | Payer: BC Managed Care – PPO | Source: Ambulatory Visit | Attending: Radiation Oncology | Admitting: Radiation Oncology

## 2019-09-24 ENCOUNTER — Encounter: Payer: Self-pay | Admitting: *Deleted

## 2019-09-24 ENCOUNTER — Other Ambulatory Visit: Payer: Self-pay

## 2019-09-24 DIAGNOSIS — C50212 Malignant neoplasm of upper-inner quadrant of left female breast: Secondary | ICD-10-CM | POA: Diagnosis not present

## 2019-09-25 ENCOUNTER — Other Ambulatory Visit: Payer: Self-pay

## 2019-09-25 ENCOUNTER — Encounter: Payer: Self-pay | Admitting: Physical Therapy

## 2019-09-25 ENCOUNTER — Ambulatory Visit
Admission: RE | Admit: 2019-09-25 | Discharge: 2019-09-25 | Disposition: A | Discharge status: Home / Self Care | Payer: BC Managed Care – PPO | Source: Ambulatory Visit | Attending: Radiation Oncology | Admitting: Radiation Oncology

## 2019-09-25 DIAGNOSIS — C50212 Malignant neoplasm of upper-inner quadrant of left female breast: Secondary | ICD-10-CM | POA: Diagnosis not present

## 2019-09-26 ENCOUNTER — Other Ambulatory Visit: Payer: Self-pay

## 2019-09-26 ENCOUNTER — Ambulatory Visit
Admission: RE | Admit: 2019-09-26 | Discharge: 2019-09-26 | Disposition: A | Discharge status: Home / Self Care | Payer: BC Managed Care – PPO | Source: Ambulatory Visit | Attending: Radiation Oncology | Admitting: Radiation Oncology

## 2019-09-26 DIAGNOSIS — C50212 Malignant neoplasm of upper-inner quadrant of left female breast: Secondary | ICD-10-CM | POA: Diagnosis not present

## 2019-09-29 ENCOUNTER — Ambulatory Visit
Admission: RE | Admit: 2019-09-29 | Discharge: 2019-09-29 | Disposition: A | Discharge status: Home / Self Care | Payer: BC Managed Care – PPO | Source: Ambulatory Visit | Attending: Radiation Oncology | Admitting: Radiation Oncology

## 2019-09-29 DIAGNOSIS — C50212 Malignant neoplasm of upper-inner quadrant of left female breast: Secondary | ICD-10-CM | POA: Diagnosis not present

## 2019-09-30 ENCOUNTER — Ambulatory Visit
Admission: RE | Admit: 2019-09-30 | Discharge: 2019-09-30 | Disposition: A | Discharge status: Home / Self Care | Payer: BC Managed Care – PPO | Source: Ambulatory Visit | Attending: Radiation Oncology | Admitting: Radiation Oncology

## 2019-09-30 DIAGNOSIS — C50212 Malignant neoplasm of upper-inner quadrant of left female breast: Secondary | ICD-10-CM | POA: Diagnosis not present

## 2019-10-01 ENCOUNTER — Other Ambulatory Visit: Payer: Self-pay

## 2019-10-01 ENCOUNTER — Ambulatory Visit
Admission: RE | Admit: 2019-10-01 | Discharge: 2019-10-01 | Disposition: A | Discharge status: Home / Self Care | Payer: BC Managed Care – PPO | Source: Ambulatory Visit | Attending: Radiation Oncology | Admitting: Radiation Oncology

## 2019-10-01 DIAGNOSIS — C50212 Malignant neoplasm of upper-inner quadrant of left female breast: Secondary | ICD-10-CM | POA: Diagnosis not present

## 2019-10-02 ENCOUNTER — Other Ambulatory Visit: Payer: Self-pay

## 2019-10-02 ENCOUNTER — Ambulatory Visit
Admission: RE | Admit: 2019-10-02 | Discharge: 2019-10-02 | Disposition: A | Discharge status: Home / Self Care | Payer: BC Managed Care – PPO | Source: Ambulatory Visit | Attending: Radiation Oncology | Admitting: Radiation Oncology

## 2019-10-02 DIAGNOSIS — C50212 Malignant neoplasm of upper-inner quadrant of left female breast: Secondary | ICD-10-CM | POA: Diagnosis not present

## 2019-10-03 ENCOUNTER — Ambulatory Visit
Admission: RE | Admit: 2019-10-03 | Discharge: 2019-10-03 | Disposition: A | Discharge status: Home / Self Care | Payer: BC Managed Care – PPO | Source: Ambulatory Visit | Attending: Radiation Oncology | Admitting: Radiation Oncology

## 2019-10-03 DIAGNOSIS — C50212 Malignant neoplasm of upper-inner quadrant of left female breast: Secondary | ICD-10-CM | POA: Diagnosis not present

## 2019-10-06 ENCOUNTER — Other Ambulatory Visit: Payer: Self-pay

## 2019-10-06 ENCOUNTER — Ambulatory Visit
Admission: RE | Admit: 2019-10-06 | Discharge: 2019-10-06 | Disposition: A | Discharge status: Home / Self Care | Payer: BC Managed Care – PPO | Source: Ambulatory Visit | Attending: Radiation Oncology | Admitting: Radiation Oncology

## 2019-10-06 DIAGNOSIS — C50212 Malignant neoplasm of upper-inner quadrant of left female breast: Secondary | ICD-10-CM | POA: Diagnosis not present

## 2019-10-07 ENCOUNTER — Other Ambulatory Visit: Payer: Self-pay

## 2019-10-07 ENCOUNTER — Ambulatory Visit
Admission: RE | Admit: 2019-10-07 | Discharge: 2019-10-07 | Disposition: A | Discharge status: Home / Self Care | Payer: BC Managed Care – PPO | Source: Ambulatory Visit | Attending: Radiation Oncology | Admitting: Radiation Oncology

## 2019-10-07 DIAGNOSIS — C50212 Malignant neoplasm of upper-inner quadrant of left female breast: Secondary | ICD-10-CM | POA: Diagnosis not present

## 2019-10-08 ENCOUNTER — Other Ambulatory Visit: Payer: Self-pay

## 2019-10-08 ENCOUNTER — Ambulatory Visit
Admission: RE | Admit: 2019-10-08 | Discharge: 2019-10-08 | Disposition: A | Discharge status: Home / Self Care | Payer: BC Managed Care – PPO | Source: Ambulatory Visit | Attending: Radiation Oncology | Admitting: Radiation Oncology

## 2019-10-08 DIAGNOSIS — C50212 Malignant neoplasm of upper-inner quadrant of left female breast: Secondary | ICD-10-CM | POA: Diagnosis not present

## 2019-10-09 ENCOUNTER — Other Ambulatory Visit: Payer: Self-pay

## 2019-10-09 ENCOUNTER — Ambulatory Visit
Admission: RE | Admit: 2019-10-09 | Discharge: 2019-10-09 | Disposition: A | Discharge status: Home / Self Care | Payer: BC Managed Care – PPO | Source: Ambulatory Visit | Attending: Radiation Oncology | Admitting: Radiation Oncology

## 2019-10-09 DIAGNOSIS — C50212 Malignant neoplasm of upper-inner quadrant of left female breast: Secondary | ICD-10-CM | POA: Diagnosis not present

## 2019-10-10 ENCOUNTER — Ambulatory Visit
Admission: RE | Admit: 2019-10-10 | Discharge: 2019-10-10 | Disposition: A | Discharge status: Home / Self Care | Payer: BC Managed Care – PPO | Source: Ambulatory Visit | Attending: Radiation Oncology | Admitting: Radiation Oncology

## 2019-10-10 ENCOUNTER — Other Ambulatory Visit: Payer: Self-pay

## 2019-10-10 DIAGNOSIS — C50212 Malignant neoplasm of upper-inner quadrant of left female breast: Secondary | ICD-10-CM | POA: Diagnosis not present

## 2019-10-13 ENCOUNTER — Other Ambulatory Visit: Payer: Self-pay

## 2019-10-13 ENCOUNTER — Ambulatory Visit
Admission: RE | Admit: 2019-10-13 | Discharge: 2019-10-13 | Disposition: A | Discharge status: Home / Self Care | Payer: BC Managed Care – PPO | Source: Ambulatory Visit | Attending: Radiation Oncology | Admitting: Radiation Oncology

## 2019-10-13 DIAGNOSIS — C50212 Malignant neoplasm of upper-inner quadrant of left female breast: Secondary | ICD-10-CM | POA: Diagnosis not present

## 2019-10-14 ENCOUNTER — Ambulatory Visit
Admission: RE | Admit: 2019-10-14 | Discharge: 2019-10-14 | Disposition: A | Discharge status: Home / Self Care | Payer: BC Managed Care – PPO | Source: Ambulatory Visit | Attending: Radiation Oncology | Admitting: Radiation Oncology

## 2019-10-14 ENCOUNTER — Other Ambulatory Visit: Payer: Self-pay

## 2019-10-14 DIAGNOSIS — C50212 Malignant neoplasm of upper-inner quadrant of left female breast: Secondary | ICD-10-CM

## 2019-10-14 DIAGNOSIS — Z170 Estrogen receptor positive status [ER+]: Secondary | ICD-10-CM

## 2019-10-14 MED ORDER — SONAFINE EX EMUL
1.0000 "application " | Freq: Two times a day (BID) | CUTANEOUS | Status: DC
  Administered 2019-10-14: 1 via TOPICAL

## 2019-10-15 ENCOUNTER — Ambulatory Visit
Admission: RE | Admit: 2019-10-15 | Discharge: 2019-10-15 | Disposition: A | Discharge status: Home / Self Care | Payer: BC Managed Care – PPO | Source: Ambulatory Visit | Attending: Radiation Oncology | Admitting: Radiation Oncology

## 2019-10-15 ENCOUNTER — Other Ambulatory Visit: Payer: Self-pay

## 2019-10-15 DIAGNOSIS — C50212 Malignant neoplasm of upper-inner quadrant of left female breast: Secondary | ICD-10-CM | POA: Diagnosis not present

## 2019-10-20 ENCOUNTER — Ambulatory Visit
Admission: RE | Admit: 2019-10-20 | Discharge: 2019-10-20 | Disposition: A | Discharge status: Home / Self Care | Payer: BC Managed Care – PPO | Source: Ambulatory Visit | Attending: Radiation Oncology | Admitting: Radiation Oncology

## 2019-10-20 ENCOUNTER — Other Ambulatory Visit: Payer: Self-pay

## 2019-10-20 DIAGNOSIS — C50212 Malignant neoplasm of upper-inner quadrant of left female breast: Secondary | ICD-10-CM | POA: Diagnosis not present

## 2019-10-21 ENCOUNTER — Other Ambulatory Visit: Payer: Self-pay

## 2019-10-21 ENCOUNTER — Ambulatory Visit
Admission: RE | Admit: 2019-10-21 | Discharge: 2019-10-21 | Disposition: A | Discharge status: Home / Self Care | Payer: BC Managed Care – PPO | Source: Ambulatory Visit | Attending: Radiation Oncology | Admitting: Radiation Oncology

## 2019-10-21 DIAGNOSIS — C50212 Malignant neoplasm of upper-inner quadrant of left female breast: Secondary | ICD-10-CM | POA: Insufficient documentation

## 2019-10-21 DIAGNOSIS — Z170 Estrogen receptor positive status [ER+]: Secondary | ICD-10-CM | POA: Diagnosis not present

## 2019-10-21 DIAGNOSIS — Z510 Encounter for antineoplastic radiation therapy: Secondary | ICD-10-CM | POA: Insufficient documentation

## 2019-10-22 ENCOUNTER — Other Ambulatory Visit: Payer: Self-pay

## 2019-10-22 ENCOUNTER — Ambulatory Visit
Admission: RE | Admit: 2019-10-22 | Discharge: 2019-10-22 | Disposition: A | Discharge status: Home / Self Care | Payer: BC Managed Care – PPO | Source: Ambulatory Visit | Attending: Radiation Oncology | Admitting: Radiation Oncology

## 2019-10-22 DIAGNOSIS — C50212 Malignant neoplasm of upper-inner quadrant of left female breast: Secondary | ICD-10-CM | POA: Diagnosis not present

## 2019-10-23 ENCOUNTER — Ambulatory Visit
Admission: RE | Admit: 2019-10-23 | Discharge: 2019-10-23 | Disposition: A | Discharge status: Home / Self Care | Payer: BC Managed Care – PPO | Source: Ambulatory Visit | Attending: Radiation Oncology | Admitting: Radiation Oncology

## 2019-10-23 ENCOUNTER — Other Ambulatory Visit: Payer: Self-pay

## 2019-10-23 DIAGNOSIS — C50212 Malignant neoplasm of upper-inner quadrant of left female breast: Secondary | ICD-10-CM | POA: Diagnosis not present

## 2019-10-24 ENCOUNTER — Ambulatory Visit
Admission: RE | Admit: 2019-10-24 | Discharge: 2019-10-24 | Disposition: A | Discharge status: Home / Self Care | Payer: BC Managed Care – PPO | Source: Ambulatory Visit | Attending: Radiation Oncology | Admitting: Radiation Oncology

## 2019-10-24 ENCOUNTER — Other Ambulatory Visit: Payer: Self-pay

## 2019-10-24 DIAGNOSIS — C50212 Malignant neoplasm of upper-inner quadrant of left female breast: Secondary | ICD-10-CM | POA: Diagnosis not present

## 2019-10-27 ENCOUNTER — Ambulatory Visit
Admission: RE | Admit: 2019-10-27 | Discharge: 2019-10-27 | Disposition: A | Discharge status: Home / Self Care | Payer: BC Managed Care – PPO | Source: Ambulatory Visit | Attending: Radiation Oncology | Admitting: Radiation Oncology

## 2019-10-27 ENCOUNTER — Ambulatory Visit: Payer: BC Managed Care – PPO

## 2019-10-27 ENCOUNTER — Other Ambulatory Visit: Payer: Self-pay

## 2019-10-27 DIAGNOSIS — C50212 Malignant neoplasm of upper-inner quadrant of left female breast: Secondary | ICD-10-CM | POA: Diagnosis not present

## 2019-10-28 ENCOUNTER — Ambulatory Visit: Payer: BC Managed Care – PPO

## 2019-10-28 ENCOUNTER — Other Ambulatory Visit: Payer: Self-pay

## 2019-10-28 ENCOUNTER — Ambulatory Visit
Admission: RE | Admit: 2019-10-28 | Discharge: 2019-10-28 | Disposition: A | Discharge status: Home / Self Care | Payer: BC Managed Care – PPO | Source: Ambulatory Visit | Attending: Radiation Oncology | Admitting: Radiation Oncology

## 2019-10-28 DIAGNOSIS — C50212 Malignant neoplasm of upper-inner quadrant of left female breast: Secondary | ICD-10-CM | POA: Diagnosis not present

## 2019-10-29 ENCOUNTER — Ambulatory Visit
Admission: RE | Admit: 2019-10-29 | Discharge: 2019-10-29 | Disposition: A | Discharge status: Home / Self Care | Payer: BC Managed Care – PPO | Source: Ambulatory Visit | Attending: Radiation Oncology | Admitting: Radiation Oncology

## 2019-10-29 ENCOUNTER — Other Ambulatory Visit: Payer: Self-pay

## 2019-10-29 DIAGNOSIS — C50212 Malignant neoplasm of upper-inner quadrant of left female breast: Secondary | ICD-10-CM | POA: Diagnosis not present

## 2019-10-30 ENCOUNTER — Ambulatory Visit
Admission: RE | Admit: 2019-10-30 | Discharge: 2019-10-30 | Disposition: A | Discharge status: Home / Self Care | Payer: BC Managed Care – PPO | Source: Ambulatory Visit | Attending: Radiation Oncology | Admitting: Radiation Oncology

## 2019-10-30 ENCOUNTER — Other Ambulatory Visit: Payer: Self-pay

## 2019-10-30 DIAGNOSIS — C50212 Malignant neoplasm of upper-inner quadrant of left female breast: Secondary | ICD-10-CM | POA: Diagnosis not present

## 2019-10-31 ENCOUNTER — Ambulatory Visit
Admission: RE | Admit: 2019-10-31 | Discharge: 2019-10-31 | Disposition: A | Discharge status: Home / Self Care | Payer: BC Managed Care – PPO | Source: Ambulatory Visit | Attending: Radiation Oncology | Admitting: Radiation Oncology

## 2019-10-31 ENCOUNTER — Ambulatory Visit: Payer: BC Managed Care – PPO

## 2019-10-31 ENCOUNTER — Other Ambulatory Visit: Payer: Self-pay

## 2019-10-31 DIAGNOSIS — C50212 Malignant neoplasm of upper-inner quadrant of left female breast: Secondary | ICD-10-CM | POA: Diagnosis not present

## 2019-11-02 NOTE — Progress Notes (Signed)
Patient Care Team: Lennie Odor, PA-C as PCP - General (Nurse Practitioner) Rockwell [place], RN as Oncology Nurse Navigator Mauro Kaufmann, RN as Oncology Nurse Navigator Alphonsa Overall, MD as Consulting Physician (General Surgery) Nicholas Lose, MD as Consulting Physician (Hematology and Oncology) Gery Pray, MD as Consulting Physician (Radiation Oncology)  DIAGNOSIS:    ICD-10-CM   1. 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 US showed a 1.0cm mass at the 9 o'clock position. Biopsy showed IDC, grade 2, HER-2 - (1+), ER+ 100%, PR+ 100%, Ki67 15%.    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    07/29/2019 Oncotype testing   Recurrence score: 6, distant recurrence at 9 years: 3%   09/17/2019 -  Radiation Therapy   Adjuvant radiation     CHIEF COMPLIANT: Follow-up to discuss antiestrogen therapy  INTERVAL HISTORY: Kathryn Webb is a 58 y.o. with above-mentioned history of left breast cancer who underwent a lumpectomy and is currently undergoing radiation. She presents to the clinic today to discuss antiestrogen therapy.  Apart from radiation dermatitis she has done quite well from radiation therapy.  REVIEW OF SYSTEMS:   Constitutional: Denies fevers, chills or abnormal weight loss Eyes: Denies blurriness of vision Ears, nose, mouth, throat, and  face: Denies mucositis or sore throat Respiratory: Denies cough, dyspnea or wheezes Cardiovascular: Denies palpitation, chest discomfort Gastrointestinal: Denies nausea, heartburn or change in bowel habits Skin: Denies abnormal skin rashes Lymphatics: Denies new lymphadenopathy or easy bruising Neurological: Denies numbness, tingling or new weaknesses Behavioral/Psych: Mood is stable, no new changes  Extremities: No lower extremity edema Breast: Radiation dermatitis All other systems were reviewed with the patient and are negative.  I have reviewed the past medical history, past surgical history, social history and family history with the patient and they are unchanged from previous note.  ALLERGIES:  has No Known Allergies.  MEDICATIONS:  Current Outpatient Medications  Medication Sig Dispense Refill  . atorvastatin (LIPITOR) 20 MG tablet Take 20 mg by mouth daily.    . Blood Glucose Monitoring Suppl (CONTOUR NEXT EZ MONITOR) W/DEVICE KIT 1 Act by Does not apply route 2 (two) times daily. 2 kit 0  . empagliflozin (JARDIANCE) 10 MG TABS tablet Take 10 mg by mouth daily.    . ergocalciferol (VITAMIN D2) 1.25 MG (50000 UT) capsule Take 50,000 Units by mouth once a week.    Marland Kitchen glucose blood (BAYER CONTOUR NEXT TEST) test strip Use BID 100 each 12  . losartan (COZAAR) 25 MG tablet Take 25 mg by mouth daily.    . metFORMIN (GLUCOPHAGE) 500 MG tablet TAKE 1 TABLET (500 MG TOTAL) BY MOUTH 2 (TWO) TIMES DAILY WITH A MEAL. (Patient taking differently: 1,000 mg 2 (two) times daily with a meal. ) 60 tablet 2  . oxyCODONE (OXY IR/ROXICODONE) 5 MG immediate release tablet TAKE 1 TABLET BY MOUTH EVERY FOUR  HOURS, AS NEEDED    . Varenicline Tartrate (CHANTIX PO) Take by mouth 2 (two) times daily.     No current facility-administered medications for this visit.    PHYSICAL EXAMINATION: ECOG PERFORMANCE STATUS: 1 - Symptomatic but completely ambulatory  Vitals:   11/03/19 1411  BP: (!) 148/71    Pulse: 62  Resp: 17  Temp: 97.9 F (36.6 C)  SpO2: 100%   Filed Weights   11/03/19 1411  Weight: 225 lb (102.1 kg)    GENERAL: alert, no distress and comfortable SKIN: skin color, texture, turgor are normal, no rashes or significant lesions EYES: normal, Conjunctiva are pink and non-injected, sclera clear OROPHARYNX: no exudate, no erythema and lips, buccal mucosa, and tongue normal  NECK: supple, thyroid normal size, non-tender, without nodularity LYMPH: no palpable lymphadenopathy in the cervical, axillary or inguinal LUNGS: clear to auscultation and percussion with normal breathing effort HEART: regular rate & rhythm and no murmurs and no lower extremity edema ABDOMEN: abdomen soft, non-tender and normal bowel sounds MUSCULOSKELETAL: no cyanosis of digits and no clubbing  NEURO: alert & oriented x 3 with fluent speech, no focal motor/sensory deficits EXTREMITIES: No lower extremity edema  LABORATORY DATA:  I have reviewed the data as listed CMP Latest Ref Rng & Units 07/09/2019 01/23/2017 03/11/2014  Glucose 70 - 99 mg/dL 114(H) 130(H) 110(H)  BUN 6 - 20 mg/dL _0 Creatinine 0.44 - 1.00 mg/dL 0.75 0.56 0.6  Sodium 135 - 145 mmol/L 139 137 139  Potassium 3.5 - 5.1 mmol/L 4.3 4.6 4.7  Chloride 98 - 111 mmol/L 103 104 103  CO2 22 - 32 mmol/L _1 Calcium 8.9 - 10.3 mg/dL 11.4(H) 11.8(H) 11.7(H)  Total Protein 6.5 - 8.1 g/dL 8.1 - 8.6(H)  Total Bilirubin 0.3 - 1.2 mg/dL 0.4 - 0.5  Alkaline Phos 38 - 126 U/L 118 - 112  AST 15 - 41 U/L 17 - 22  ALT 0 - 44 U/L 18 - 20    Lab Results  Component Value Date   WBC 5.4 07/09/2019   HGB 12.6 07/09/2019   HCT 39.5 07/09/2019   MCV 84.4 07/09/2019   PLT 238 07/09/2019   NEUTROABS 2.4 07/09/2019    ASSESSMENT & PLAN:  Malignant neoplasm of upper-inner quadrant of left breast in female, estrogen receptor positive ([place]) 07/04/2019:Routine screening detected an irregular, indeterminate architectural distortion in the left  breast. US showed a 1.0cm mass at the 9 o'clock position. Biopsy showed IDC, grade 2, HER-2 - (1+), ER+ 100%, PR+ 100%, Ki67 15%. T1cN0 stage Ia clinical stage  07/29/2019:Left lumpectomy [name]): IDC with DCIS, grade 2, 1.5cm, clear margins, and one lymph node negative.  ER 100%, PR 100%, Ki-67 15%, HER-2 negative T1CN0 stage Ia  Oncotype DX recurrence score: 6  Treatment plan: Anastrozole 1 mg daily starting November 21, 2019 Anastrozole counseling:We discussed the risks and benefits of anti-estrogen therapy with aromatase inhibitors. These include but not limited to insomnia, hot flashes, mood changes, vaginal dryness, bone density loss, and weight gain. We strongly believe that the benefits far outweigh the risks. Patient understands these risks and consented to starting treatment. Planned treatment duration is 5-7  years.  Return to clinic in 3 months for survivorship care plan visit  No orders of the defined types were placed in this encounter.  The patient has a good understanding of the overall plan. she agrees with it. she will call with any problems that may develop before the next  visit here.  Nicholas Lose, MD 11/03/2019  Julious Oka Dorshimer, am acting as scribe for Dr. Nicholas Lose.  I have reviewed the above document for accuracy and completeness, and I agree with the above.

## 2019-11-03 ENCOUNTER — Encounter: Payer: Self-pay | Admitting: Radiation Oncology

## 2019-11-03 ENCOUNTER — Other Ambulatory Visit: Payer: Self-pay

## 2019-11-03 ENCOUNTER — Encounter: Payer: Self-pay | Admitting: *Deleted

## 2019-11-03 ENCOUNTER — Inpatient Hospital Stay: Payer: BC Managed Care – PPO | Attending: Hematology and Oncology | Admitting: Hematology and Oncology

## 2019-11-03 ENCOUNTER — Ambulatory Visit
Admission: RE | Admit: 2019-11-03 | Discharge: 2019-11-03 | Disposition: A | Discharge status: Home / Self Care | Payer: BC Managed Care – PPO | Source: Ambulatory Visit | Attending: Radiation Oncology | Admitting: Radiation Oncology

## 2019-11-03 DIAGNOSIS — Z170 Estrogen receptor positive status [ER+]: Secondary | ICD-10-CM | POA: Insufficient documentation

## 2019-11-03 DIAGNOSIS — C50212 Malignant neoplasm of upper-inner quadrant of left female breast: Secondary | ICD-10-CM | POA: Insufficient documentation

## 2019-11-03 DIAGNOSIS — Z79899 Other long term (current) drug therapy: Secondary | ICD-10-CM | POA: Insufficient documentation

## 2019-11-03 DIAGNOSIS — Z7984 Long term (current) use of oral hypoglycemic drugs: Secondary | ICD-10-CM | POA: Insufficient documentation

## 2019-11-03 MED ORDER — ANASTROZOLE 1 MG PO TABS: 1.0000 mg | ORAL_TABLET | Freq: Every day | ORAL | 3 refills | Status: DC

## 2019-11-03 NOTE — Assessment & Plan Note (Signed)
07/04/2019:Routine screening detected an irregular, indeterminate architectural distortion in the left breast. US showed a 1.0cm mass at the 9 o'clock position. Biopsy showed IDC, grade 2, HER-2 - (1+), ER+ 100%, PR+ 100%, Ki67 15%. 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  ER 100%, PR 100%, Ki-67 15%, HER-2 negative T1CN0 stage Ia  Oncotype DX recurrence score: 6

## 2019-11-04 ENCOUNTER — Telehealth: Payer: Self-pay | Admitting: Hematology and Oncology

## 2019-11-04 NOTE — Telephone Encounter (Signed)
I talk with patient regarding schedule  

## 2019-11-05 NOTE — Progress Notes (Incomplete)
  Patient Name: Kathryn Webb MRN: [id number] DOB: 01-09-1961 Referring Physician: Nicholas Lose (Profile Not Attached) Date of Service: 11/03/2019 [facility], [place]                                                        End Of Treatment Note  Diagnoses: C50.212-Malignant neoplasm of upper-inner quadrant of left female breast  Cancer Staging: Stage IA (T1c, N0) Left Breast UIQ, Invasive Ductal Carcinoma with DCIS, ER+ / PR+ / Her2-, Grade 2  Intent: Curative  Radiation Treatment Dates: 09/16/2019 through 11/03/2019 Site Technique Total Dose (Gy) Dose per Fx (Gy) Completed Fx Beam Energies  Breast, Left: Breast_Lt 3D 50.4/50.4 1.8 28/28 6X, 10X  Breast, Left: Breast_Lt_Bst 3D 10/10 2 5/5 6X, 10X   Narrative: The patient tolerated radiation therapy relatively well. Patient reported moderate to severe fatigue throughout. She also reported pain in left breast and along left ribs during beginning of treatment. On 09/23/2019 examination, patient continued to have a hematoma within the medial left breast but overall, it had decreased in size since intitial examination. No signs of infection in breast. She also had some mild tenderness in the left rib cage just below the inframammary fold without signs of infection.  Towards the end of treatment, patient began to report soreness, heaviness, and itching of left breast. Breast was diffusely hyperpigmented and swollen with mild skin breakdown in the inframammary fold for which she was advised to use Neosporin. She also had some peeling without moist desquamation.  Of note, patient reported difficulty sleeping secondary to right shoulder pain due to impingement. She did not have any significant improvement with physical therapy. Patient had been taking Tylenol, Hydrocodone, and  Oxycodone. Suggested Advil or Aleve. She received Xanax by her PCP to help with sleep.  Plan: The patient will follow-up with radiation oncology in left breast.  ________________________________________________   Blair Promise, PhD, MD  This document serves as a record of services personally performed by Gery Pray, MD. It was created on his behalf by Clerance Lav, a trained medical scribe. The creation of this record is based on the scribe's personal observations and the provider's statements to them. This document has been checked and approved by the attending provider.

## 2019-11-11 ENCOUNTER — Encounter: Payer: Self-pay | Admitting: *Deleted

## 2019-12-08 ENCOUNTER — Encounter: Payer: Self-pay | Admitting: Radiation Oncology

## 2019-12-08 ENCOUNTER — Other Ambulatory Visit: Payer: Self-pay

## 2019-12-08 ENCOUNTER — Ambulatory Visit
Admission: RE | Admit: 2019-12-08 | Discharge: 2019-12-08 | Disposition: A | Discharge status: Home / Self Care | Payer: BC Managed Care – PPO | Source: Ambulatory Visit | Attending: Radiation Oncology | Admitting: Radiation Oncology

## 2019-12-08 VITALS — BP 148/65 | HR 72 | Temp 97.9°F | Resp 18 | Ht 71.5 in | Wt 226.5 lb

## 2019-12-08 DIAGNOSIS — C50212 Malignant neoplasm of upper-inner quadrant of left female breast: Secondary | ICD-10-CM | POA: Insufficient documentation

## 2019-12-08 DIAGNOSIS — R232 Flushing: Secondary | ICD-10-CM | POA: Insufficient documentation

## 2019-12-08 DIAGNOSIS — Z170 Estrogen receptor positive status [ER+]: Secondary | ICD-10-CM | POA: Insufficient documentation

## 2019-12-08 DIAGNOSIS — Z79899 Other long term (current) drug therapy: Secondary | ICD-10-CM | POA: Diagnosis not present

## 2019-12-08 DIAGNOSIS — Z7984 Long term (current) use of oral hypoglycemic drugs: Secondary | ICD-10-CM | POA: Insufficient documentation

## 2019-12-08 DIAGNOSIS — R61 Generalized hyperhidrosis: Secondary | ICD-10-CM | POA: Insufficient documentation

## 2019-12-08 DIAGNOSIS — Z923 Personal history of irradiation: Secondary | ICD-10-CM | POA: Insufficient documentation

## 2019-12-08 NOTE — Progress Notes (Signed)
Radiation Oncology         [phone] ________________________________  Name: Kathryn Webb MRN: [id number]  Date: 12/08/2019  DOB: 05-09-1961  Follow-Up Visit Note  CC: Wells Guiles, MD    ICD-10-CM   1. Malignant neoplasm of upper-inner quadrant of left breast in female, estrogen receptor positive ([place])  C50.212    Z17.0     Diagnosis: Stage IA (T1c, N0) Left Breast UIQ, Invasive Ductal Carcinoma with DCIS, ER+ / PR+ / Her2-, Grade 2  Interval Since Last Radiation: One month and four days.  Radiation Treatment Dates: 09/16/2019 through 11/03/2019 Site Technique Total Dose (Gy) Dose per Fx (Gy) Completed Fx Beam Energies  Breast, Left: Breast_Lt 3D 50.4/50.4 1.8 28/28 6X, 10X  Breast, Left: Breast_Lt_Bst 3D 10/10 2 5/5 6X, 10X    Narrative:  The patient returns today for routine follow-up. No significant interval history since the end of treatment except that she has started adjuvant hormonal therapy and has noticed increase in her hot flashes and night sweats.  On review of systems, patient reports occasional sharp shooting pains within the breast.  She has noticed swelling in the left breast does not seem to be very bothersome to her.  She does use a compression bra during the daytime.. Patient denies nipple discharge or bleeding.              ALLERGIES:  has No Known Allergies.  Meds: Current Outpatient Medications  Medication Sig Dispense Refill  . anastrozole (ARIMIDEX) 1 MG tablet Take 1 tablet (1 mg total) by mouth daily. 90 tablet 3  . Ascorbic Acid (VITAMIN C WITH ROSE HIPS) 500 MG tablet Take 500 mg by mouth daily.    Marland Kitchen atorvastatin (LIPITOR) 20 MG tablet Take 20 mg by mouth daily.    . Blood Glucose Monitoring Suppl (CONTOUR NEXT EZ MONITOR) W/DEVICE KIT 1 Act by Does not apply route 2 (two) times daily. 2 kit 0  . clonazePAM (KLONOPIN) 1 MG tablet Take 1 mg by mouth at bedtime.    . ergocalciferol (VITAMIN D2) 1.25 MG (50000 UT) capsule  Take 50,000 Units by mouth once a week.    Marland Kitchen glucose blood (BAYER CONTOUR NEXT TEST) test strip Use BID 100 each 12  . HYDROcodone-acetaminophen (NORCO) 10-325 MG tablet Take 1 tablet by mouth 4 (four) times daily as needed.    Marland Kitchen losartan (COZAAR) 25 MG tablet Take 25 mg by mouth daily.    . metFORMIN (GLUCOPHAGE) 1000 MG tablet Take 1,000 mg by mouth 2 (two) times daily.    . traZODone (DESYREL) 50 MG tablet     . empagliflozin (JARDIANCE) 10 MG TABS tablet Take 10 mg by mouth daily.     No current facility-administered medications for this encounter.    Physical Findings: The patient is in no acute distress. Patient is alert and oriented.  height is 5' 11.5" (1.816 m) and weight is 226 lb 8 oz (102.7 kg). Her temporal temperature is 97.9 F (36.6 C). Her blood pressure is 148/65 (abnormal) and her pulse is 72. Her respiration is 18 and oxygen saturation is 100%. .  No significant changes. Lungs are clear to auscultation bilaterally. Heart has regular rate and rhythm. No palpable cervical, supraclavicular, or axillary adenopathy. Abdomen soft, non-tender, normal bowel sounds. Right breast: no palpable mass, nipple discharge or bleeding. Left breast: Hyperpigmentation changes noted.  Patient skin is healed well.  She continues to have a fair amount of edema in  the breast, although on exam the breast is nontender with palpation.  There is induration in the upper inner aspect of the breast consistent with seroma at her lumpectomy site.  No nipple discharge or bleeding noted  Lab Findings: Lab Results  Component Value Date   WBC 5.4 07/09/2019   HGB 12.6 07/09/2019   HCT 39.5 07/09/2019   MCV 84.4 07/09/2019   PLT 238 07/09/2019    Radiographic Findings: No results found.  Impression: Stage IA (T1c, N0) Left Breast UIQ, Invasive Ductal Carcinoma with DCIS, ER+ / PR+ / Her2-, Grade 2  The patient is recovering from the effects of radiation.  No evidence of recurrence on clinical exam.  patient skin is healed well.  She has some persistent edema in the breast we discussed consideration for formal evaluation with physical therapy if this persists or becomes bothersome to her.  At this time she does not wish to pursue physical therapy evaluation.  Plan: As needed follow-up in radiation oncology. She is scheduled to follow-up with Wilber Bihari, oncology NP, on 02/03/2020.  ____________________________________   Blair Promise, PhD, MD  This document serves as a record of services personally performed by Gery Pray, MD. It was created on his behalf by Clerance Lav, a trained medical scribe. The creation of this record is based on the scribe's personal observations and the provider's statements to them. This document has been checked and approved by the attending provider.

## 2019-12-08 NOTE — Progress Notes (Signed)
Pt presents today for f/u with Dr. Sondra Come. Pt reports fatigue is lingering but is unable to sleep a full night and attributes fatigue insomnia. Pt reports rare sharp, shooting pains throughout breast. Pt reports using vitamin E oil on breast. Breast with areas of hyperpigmentation, and noticeable swelling.   BP (!) 148/65 (BP Location: Right Arm, Patient Position: Sitting)   Pulse 72   Temp 97.9 F (36.6 C) (Temporal)   Resp 18   Ht 5' 11.5" (1.816 m)   Wt 226 lb 8 oz (102.7 kg)   LMP 11/20/2012   SpO2 100%   BMI 31.15 kg/m   Wt Readings from Last 3 Encounters:  12/08/19 226 lb 8 oz (102.7 kg)  11/03/19 225 lb (102.1 kg)  08/20/19 224 lb 3.2 oz (101.7 kg)   Loma Sousa, RN BSN

## 2019-12-08 NOTE — Patient Instructions (Signed)
Coronavirus (COVID-19) Are you at risk?  Are you at risk for the Coronavirus (COVID-19)?  To be considered HIGH RISK for Coronavirus (COVID-19), you have to meet the following criteria:  . Traveled to [place], [place], [place], [place] or [place]; or in the [place] to [place], [place], [place], or [place]; and have fever, cough, and shortness of breath within the last 2 weeks of travel OR . Been in close contact with a person diagnosed with COVID-19 within the last 2 weeks and have fever, cough, and shortness of breath . IF YOU DO NOT MEET THESE CRITERIA, YOU ARE CONSIDERED LOW RISK FOR COVID-19.  What to do if you are HIGH RISK for COVID-19?  . If you are having a medical emergency, call 911. . Seek medical care right away. Before you go to a doctor's office, urgent care or emergency department, call ahead and tell them about your recent travel, contact with someone diagnosed with COVID-19, and your symptoms. You should receive instructions from your physician's office regarding next steps of care.  . When you arrive at healthcare provider, tell the healthcare staff immediately you have returned from visiting [place], [place], [place], [place] or [place]; or traveled in the [place] to [place], [place], [place], or [place]; in the last two weeks or you have been in close contact with a person diagnosed with COVID-19 in the last 2 weeks.   . Tell the health care staff about your symptoms: fever, cough and shortness of breath. . After you have been seen by a medical provider, you will be either: o Tested for (COVID-19) and discharged home on quarantine except to seek medical care if symptoms worsen, and asked to  - Stay home and avoid contact with others until you get your results (4-5 days)  - Avoid travel on public transportation if possible (such as bus, train, or airplane) or o Sent to the Emergency Department by EMS for evaluation, COVID-19 testing, and possible  admission depending on your condition and test results.  What to do if you are LOW RISK for COVID-19?  Reduce your risk of any infection by using the same precautions used for avoiding the common cold or flu:  . Wash your hands often with soap and warm water for at least 20 seconds.  If soap and water are not readily available, use an alcohol-based hand sanitizer with at least 60% alcohol.  . If coughing or sneezing, cover your mouth and nose by coughing or sneezing into the elbow areas of your shirt or coat, into a tissue or into your sleeve (not your hands). . Avoid shaking hands with others and consider head nods or verbal greetings only. . Avoid touching your eyes, nose, or mouth with unwashed hands.  . Avoid close contact with people who are sick. . Avoid places or events with large numbers of people in one location, like concerts or sporting events. . Carefully consider travel plans you have or are making. . If you are planning any travel outside or inside the [place], visit the CDC's Travelers' Health webpage for the latest health notices. . If you have some symptoms but not all symptoms, continue to monitor at home and seek medical attention if your symptoms worsen. . If you are having a medical emergency, call 911.   ADDITIONAL HEALTHCARE OPTIONS FOR PATIENTS  [facility] / e-Visit: [email or web address]         [facility]: [phone]  [facility]: [phone]                   [facility]: [phone]   

## 2020-01-01 ENCOUNTER — Ambulatory Visit: Payer: BC Managed Care – PPO | Attending: Family

## 2020-01-01 DIAGNOSIS — Z23 Encounter for immunization: Secondary | ICD-10-CM | POA: Insufficient documentation

## 2020-01-01 NOTE — Progress Notes (Signed)
   Covid-19 Vaccination Clinic  Name:  Kathryn Webb    MRN: [id number] DOB: November 08, 1961  01/01/2020  Ms. Kathryn Webb was observed post Covid-19 immunization for 15 minutes without incidence. She was provided with Vaccine Information Sheet and instruction to access the V-Safe system.   Ms. Kathryn Webb was instructed to call 911 with any severe reactions post vaccine: Marland Kitchen Difficulty breathing  . Swelling of your face and throat  . A fast heartbeat  . A bad rash all over your body  . Dizziness and weakness

## 2020-01-07 ENCOUNTER — Ambulatory Visit
Admission: RE | Admit: 2020-01-07 | Discharge: 2020-01-07 | Disposition: A | Discharge status: Home / Self Care | Payer: BC Managed Care – PPO | Source: Ambulatory Visit | Attending: Radiation Oncology | Admitting: Radiation Oncology

## 2020-01-07 ENCOUNTER — Encounter: Payer: Self-pay | Admitting: Radiation Oncology

## 2020-01-07 ENCOUNTER — Other Ambulatory Visit: Payer: Self-pay

## 2020-01-07 VITALS — BP 143/69 | HR 71 | Temp 97.1°F | Resp 18 | Ht 71.5 in | Wt 225.0 lb

## 2020-01-07 DIAGNOSIS — Z170 Estrogen receptor positive status [ER+]: Secondary | ICD-10-CM | POA: Diagnosis not present

## 2020-01-07 DIAGNOSIS — C50212 Malignant neoplasm of upper-inner quadrant of left female breast: Secondary | ICD-10-CM

## 2020-01-07 DIAGNOSIS — Z79899 Other long term (current) drug therapy: Secondary | ICD-10-CM | POA: Diagnosis not present

## 2020-01-07 DIAGNOSIS — N6320 Unspecified lump in the left breast, unspecified quadrant: Secondary | ICD-10-CM | POA: Diagnosis not present

## 2020-01-07 DIAGNOSIS — Z7984 Long term (current) use of oral hypoglycemic drugs: Secondary | ICD-10-CM | POA: Insufficient documentation

## 2020-01-07 MED ORDER — HYDROCODONE-ACETAMINOPHEN 10-325 MG PO TABS: 1.0000 | ORAL_TABLET | Freq: Four times a day (QID) | ORAL | 0 refills | Status: DC | PRN

## 2020-01-07 NOTE — Patient Instructions (Signed)
Coronavirus (COVID-19) Are you at risk?  Are you at risk for the Coronavirus (COVID-19)?  To be considered HIGH RISK for Coronavirus (COVID-19), you have to meet the following criteria:  . Traveled to [place], [place], [place], [place] or [place]; or in the [place] to [place], [place], [place], or [place]; and have fever, cough, and shortness of breath within the last 2 weeks of travel OR . Been in close contact with a person diagnosed with COVID-19 within the last 2 weeks and have fever, cough, and shortness of breath . IF YOU DO NOT MEET THESE CRITERIA, YOU ARE CONSIDERED LOW RISK FOR COVID-19.  What to do if you are HIGH RISK for COVID-19?  . If you are having a medical emergency, call 911. . Seek medical care right away. Before you go to a doctor's office, urgent care or emergency department, call ahead and tell them about your recent travel, contact with someone diagnosed with COVID-19, and your symptoms. You should receive instructions from your physician's office regarding next steps of care.  . When you arrive at healthcare provider, tell the healthcare staff immediately you have returned from visiting [place], [place], [place], [place] or [place]; or traveled in the [place] to [place], [place], [place], or [place]; in the last two weeks or you have been in close contact with a person diagnosed with COVID-19 in the last 2 weeks.   . Tell the health care staff about your symptoms: fever, cough and shortness of breath. . After you have been seen by a medical provider, you will be either: o Tested for (COVID-19) and discharged home on quarantine except to seek medical care if symptoms worsen, and asked to  - Stay home and avoid contact with others until you get your results (4-5 days)  - Avoid travel on public transportation if possible (such as bus, train, or airplane) or o Sent to the Emergency Department by EMS for evaluation, COVID-19 testing, and possible  admission depending on your condition and test results.  What to do if you are LOW RISK for COVID-19?  Reduce your risk of any infection by using the same precautions used for avoiding the common cold or flu:  . Wash your hands often with soap and warm water for at least 20 seconds.  If soap and water are not readily available, use an alcohol-based hand sanitizer with at least 60% alcohol.  . If coughing or sneezing, cover your mouth and nose by coughing or sneezing into the elbow areas of your shirt or coat, into a tissue or into your sleeve (not your hands). . Avoid shaking hands with others and consider head nods or verbal greetings only. . Avoid touching your eyes, nose, or mouth with unwashed hands.  . Avoid close contact with people who are sick. . Avoid places or events with large numbers of people in one location, like concerts or sporting events. . Carefully consider travel plans you have or are making. . If you are planning any travel outside or inside the [place], visit the CDC's Travelers' Health webpage for the latest health notices. . If you have some symptoms but not all symptoms, continue to monitor at home and seek medical attention if your symptoms worsen. . If you are having a medical emergency, call 911.   ADDITIONAL HEALTHCARE OPTIONS FOR PATIENTS  [facility] / e-Visit: [email or web address]         [facility]: [phone]  [facility]: [phone]                   [facility]: [phone]   

## 2020-01-07 NOTE — Progress Notes (Signed)
Pt reports that she received the covid 19 vaccination in left arm on Thursday and noted left breast swelling after vaccination. Pt notes that pain in breast is not as intense as "it had been" but rates pain in left breast 4/10. Breast is still faintly hyperpigmented, swollen. Breast is warm and firm.   BP (!) 143/69 (BP Location: Left Arm, Patient Position: Sitting)   Pulse 71   Temp (!) 97.1 F (36.2 C) (Temporal)   Resp 18   Ht 5' 11.5" (1.816 m)   Wt 225 lb (102.1 kg)   LMP 11/20/2012   SpO2 97%   BMI 30.94 kg/m   Wt Readings from Last 3 Encounters:  01/07/20 225 lb (102.1 kg)  12/08/19 226 lb 8 oz (102.7 kg)  11/03/19 225 lb (102.1 kg)   Loma Sousa, RN BSN

## 2020-01-07 NOTE — Progress Notes (Signed)
Radiation Oncology         ([phone] ________________________________  Name: Kathryn Webb MRN: [id number]  Date: 01/07/2020  DOB: February 19, 1961  Follow-Up Visit Note  CC: Lennie Odor, [place]  Nicholas Lose, MD    ICD-10-CM   1. Malignant neoplasm of upper-inner quadrant of left breast in female, estrogen receptor positive ([place])  C50.212    Z17.0     Diagnosis: Stage IA (T1c, N0) Left Breast UIQ, Invasive Ductal Carcinoma with DCIS, ER+ / PR+ / Her2-, Grade 2  Interval Since Last Radiation: Two months and three days.  Radiation Treatment Dates: 09/16/2019 through 11/03/2019 Site Technique Total Dose (Gy) Dose per Fx (Gy) Completed Fx Beam Energies  Breast, Left: Breast_Lt 3D 50.4/50.4 1.8 28/28 6X, 10X  Breast, Left: Breast_Lt_Bst 3D 10/10 2 5/5 6X, 10X    Narrative:  The patient returns today for unscheduled follow-up. She reports receiving the COVID-19 vaccination in her left arm on 01/01/2020. Following that, she has noted swelling in her left breast and and resulting pain from swelling.  Patient denies any nipple discharge or bleeding.  She denies any erythema to the breast chills or fever.  She denies any breathing problems or chest tightness after her vaccine.  She denies any swelling in her left arm or hand.  No report of rash developing.    ALLERGIES:  has No Known Allergies.  Meds: Current Outpatient Medications  Medication Sig Dispense Refill  . anastrozole (ARIMIDEX) 1 MG tablet Take 1 tablet (1 mg total) by mouth daily. 90 tablet 3  . Ascorbic Acid (VITAMIN C WITH ROSE HIPS) 500 MG tablet Take 500 mg by mouth daily.    Marland Kitchen atorvastatin (LIPITOR) 20 MG tablet Take 20 mg by mouth daily.    . Blood Glucose Monitoring Suppl (CONTOUR NEXT EZ MONITOR) W/DEVICE KIT 1 Act by Does not apply route 2 (two) times daily. 2 kit 0  . clonazePAM (KLONOPIN) 1 MG tablet Take 1 mg by mouth at bedtime.    . empagliflozin (JARDIANCE) 10 MG TABS tablet Take 10 mg by mouth daily.    .  ergocalciferol (VITAMIN D2) 1.25 MG (50000 UT) capsule Take 50,000 Units by mouth once a week.    Marland Kitchen glucose blood (BAYER CONTOUR NEXT TEST) test strip Use BID 100 each 12  . HYDROcodone-acetaminophen (NORCO) 10-325 MG tablet Take 1 tablet by mouth 4 (four) times daily as needed.    Marland Kitchen losartan (COZAAR) 25 MG tablet Take 25 mg by mouth daily.    . metFORMIN (GLUCOPHAGE) 1000 MG tablet Take 1,000 mg by mouth 2 (two) times daily.    . traZODone (DESYREL) 50 MG tablet      No current facility-administered medications for this encounter.    Physical Findings: The patient is in no acute distress. Patient is alert and oriented.  height is 5' 11.5" (1.816 m) and weight is 225 lb (102.1 kg). Her temporal temperature is 97.1 F (36.2 C) (abnormal). Her blood pressure is 143/69 (abnormal) and her pulse is 71. Her respiration is 18 and oxygen saturation is 97%. .  No significant changes. Lungs are clear to auscultation bilaterally. Heart has regular rate and rhythm. No palpable cervical, supraclavicular, or axillary adenopathy. Abdomen soft, non-tender, normal bowel sounds. Right breast: no palpable mass, nipple discharge or bleeding. Left breast: Hyperpigmentation changes noted throughout the breast.  Skin is well-healed.  Patient does have edema in the breast mild diffuse in nature.  Induration is noted at the lumpectomy cavity consistent  with seroma in this region.  No signs of cellulitis or infection within the breast.  Lab Findings: Lab Results  Component Value Date   WBC 5.4 07/09/2019   HGB 12.6 07/09/2019   HCT 39.5 07/09/2019   MCV 84.4 07/09/2019   PLT 238 07/09/2019    Radiographic Findings: No results found.  Impression: Stage IA (T1c, N0) Left Breast UIQ, Invasive Ductal Carcinoma with DCIS, ER+ / PR+ / Her2-, Grade 2  Patient appears to have developed swelling in her left breast related to her COVID-19 vaccine.  I suspect this is some type of inflammatory response to the vaccine and  will improve with time.  Patient's pain is significant enough to keep her weight gain despite over-the-counter Advil Aleve or Tylenol she is not able to sleep at night.  In light of this I will give her a limited prescription for narcotics to use the situation.   Plan:  She is scheduled to follow-up with Wilber Bihari, oncology NP, on 02/03/2020.  She will return for follow-up in 2 to 3 weeks or sooner if the patient's swelling worsens.  I discussed that she may be a candidate for physical therapy concerning her breast lymphedema  ____________________________________   Blair Promise, PhD, MD  This document serves as a record of services personally performed by Gery Pray, MD. It was created on his behalf by Clerance Lav, a trained medical scribe. The creation of this record is based on the scribe's personal observations and the provider's statements to them. This document has been checked and approved by the attending provider.

## 2020-01-08 ENCOUNTER — Telehealth: Payer: Self-pay | Admitting: *Deleted

## 2020-01-08 NOTE — Telephone Encounter (Signed)
Called patient to inform of fu appt. for 01-29-20 @ 10:15 am, lvm for a return call

## 2020-01-27 ENCOUNTER — Telehealth: Payer: Self-pay | Admitting: *Deleted

## 2020-01-27 NOTE — Telephone Encounter (Signed)
On 01/27/20 faxed medical records to [facility]  

## 2020-01-29 ENCOUNTER — Other Ambulatory Visit: Payer: Self-pay

## 2020-01-29 ENCOUNTER — Encounter: Payer: Self-pay | Admitting: Radiation Oncology

## 2020-01-29 ENCOUNTER — Ambulatory Visit
Admission: RE | Admit: 2020-01-29 | Discharge: 2020-01-29 | Disposition: A | Discharge status: Home / Self Care | Payer: BC Managed Care – PPO | Source: Ambulatory Visit | Attending: Radiation Oncology | Admitting: Radiation Oncology

## 2020-01-29 VITALS — BP 135/71 | HR 61 | Temp 97.2°F | Resp 18 | Ht 71.5 in | Wt 229.2 lb

## 2020-01-29 DIAGNOSIS — Z79899 Other long term (current) drug therapy: Secondary | ICD-10-CM | POA: Insufficient documentation

## 2020-01-29 DIAGNOSIS — Z170 Estrogen receptor positive status [ER+]: Secondary | ICD-10-CM | POA: Insufficient documentation

## 2020-01-29 DIAGNOSIS — Z7984 Long term (current) use of oral hypoglycemic drugs: Secondary | ICD-10-CM | POA: Insufficient documentation

## 2020-01-29 DIAGNOSIS — C50212 Malignant neoplasm of upper-inner quadrant of left female breast: Secondary | ICD-10-CM | POA: Insufficient documentation

## 2020-01-29 DIAGNOSIS — Z923 Personal history of irradiation: Secondary | ICD-10-CM | POA: Insufficient documentation

## 2020-01-29 NOTE — Patient Instructions (Signed)
Coronavirus (COVID-19) Are you at risk?  Are you at risk for the Coronavirus (COVID-19)?  To be considered HIGH RISK for Coronavirus (COVID-19), you have to meet the following criteria:  . Traveled to [place], [place], [place], [place] or [place]; or in the [place] to [place], [place], [place], or [place]; and have fever, cough, and shortness of breath within the last 2 weeks of travel OR . Been in close contact with a person diagnosed with COVID-19 within the last 2 weeks and have fever, cough, and shortness of breath . IF YOU DO NOT MEET THESE CRITERIA, YOU ARE CONSIDERED LOW RISK FOR COVID-19.  What to do if you are HIGH RISK for COVID-19?  . If you are having a medical emergency, call 911. . Seek medical care right away. Before you go to a doctor's office, urgent care or emergency department, call ahead and tell them about your recent travel, contact with someone diagnosed with COVID-19, and your symptoms. You should receive instructions from your physician's office regarding next steps of care.  . When you arrive at healthcare provider, tell the healthcare staff immediately you have returned from visiting [place], [place], [place], [place] or [place]; or traveled in the [place] to [place], [place], [place], or [place]; in the last two weeks or you have been in close contact with a person diagnosed with COVID-19 in the last 2 weeks.   . Tell the health care staff about your symptoms: fever, cough and shortness of breath. . After you have been seen by a medical provider, you will be either: o Tested for (COVID-19) and discharged home on quarantine except to seek medical care if symptoms worsen, and asked to  - Stay home and avoid contact with others until you get your results (4-5 days)  - Avoid travel on public transportation if possible (such as bus, train, or airplane) or o Sent to the Emergency Department by EMS for evaluation, COVID-19 testing, and possible  admission depending on your condition and test results.  What to do if you are LOW RISK for COVID-19?  Reduce your risk of any infection by using the same precautions used for avoiding the common cold or flu:  . Wash your hands often with soap and warm water for at least 20 seconds.  If soap and water are not readily available, use an alcohol-based hand sanitizer with at least 60% alcohol.  . If coughing or sneezing, cover your mouth and nose by coughing or sneezing into the elbow areas of your shirt or coat, into a tissue or into your sleeve (not your hands). . Avoid shaking hands with others and consider head nods or verbal greetings only. . Avoid touching your eyes, nose, or mouth with unwashed hands.  . Avoid close contact with people who are sick. . Avoid places or events with large numbers of people in one location, like concerts or sporting events. . Carefully consider travel plans you have or are making. . If you are planning any travel outside or inside the US, visit the CDC's Travelers' Health webpage for the latest health notices. . If you have some symptoms but not all symptoms, continue to monitor at home and seek medical attention if your symptoms worsen. . If you are having a medical emergency, call 911.   ADDITIONAL HEALTHCARE OPTIONS FOR PATIENTS  [facility] / e-Visit: [email or web address]         [facility]: [phone]  [facility]: [phone]                   [facility]: [phone]   

## 2020-01-29 NOTE — Progress Notes (Signed)
Kathryn Webb presents today for f/u with Dr. Sondra Come. Pt reports chronic breast "discomfort" rated 2/10. Pt is continuing to plagued by insomnia. Pt is using vitamin E on breast. Breast is slightly hyperpigmented and continues to have swelling. Pt is wearing compression bra.  BP 135/71 (BP Location: Right Arm, Patient Position: Sitting)   Pulse 61   Temp (!) 97.2 F (36.2 C) (Temporal)   Resp 18   Ht 5' 11.5" (1.816 m)   Wt 229 lb 4 oz (104 kg)   LMP 11/20/2012   SpO2 100%   BMI 31.53 kg/m   Wt Readings from Last 3 Encounters:  01/29/20 229 lb 4 oz (104 kg)  01/07/20 225 lb (102.1 kg)  12/08/19 226 lb 8 oz (102.7 kg)   Loma Sousa, RN BSN

## 2020-01-29 NOTE — Progress Notes (Signed)
Radiation Oncology         [phone]) [phone] ________________________________  Name: Kathryn Webb MRN: [id number]  Date: 01/29/2020  DOB: Aug 20, 1961  Follow-Up Visit Note  CC: Lennie Odor, [place]  Nicholas Lose, MD    ICD-10-CM   1. Malignant neoplasm of upper-inner quadrant of left breast in female, estrogen receptor positive ([place])  C50.212    Z17.0     Diagnosis: Stage IA (T1c, N0) Left Breast UIQ, Invasive Ductal Carcinoma with DCIS, ER+ / PR+ / Her2-, Grade 2  Interval Since Last Radiation: Three months  Radiation Treatment Dates: 09/16/2019 through 11/03/2019 Site Technique Total Dose (Gy) Dose per Fx (Gy) Completed Fx Beam Energies  Breast, Left: Breast_Lt 3D 50.4/50.4 1.8 28/28 6X, 10X  Breast, Left: Breast_Lt_Bst 3D 10/10 2 5/5 6X, 10X    Narrative:  The patient returns today for routine follow-up.   On review of systems, she reports chronic breast discomfort, which she rates 2/10. She endorses using vitamin E on the breast and wearing a compression bra. She also reports continued insomnia.  She reports continued swelling in the breast which makes it difficult for her to sleep on that side at night.  She stopped taking the narcotic as this caused a lot of drowsiness for her particularly if she took it in the evening prior to work today.  ALLERGIES:  has No Known Allergies.  Meds: Current Outpatient Medications  Medication Sig Dispense Refill  . anastrozole (ARIMIDEX) 1 MG tablet Take 1 tablet (1 mg total) by mouth daily. 90 tablet 3  . Ascorbic Acid (VITAMIN C WITH ROSE HIPS) 500 MG tablet Take 500 mg by mouth daily.    Marland Kitchen atorvastatin (LIPITOR) 20 MG tablet Take 20 mg by mouth daily.    . Blood Glucose Monitoring Suppl (CONTOUR NEXT EZ MONITOR) W/DEVICE KIT 1 Act by Does not apply route 2 (two) times daily. 2 kit 0  . clonazePAM (KLONOPIN) 1 MG tablet Take 1 mg by mouth at bedtime.    . empagliflozin (JARDIANCE) 10 MG TABS tablet Take 10 mg by mouth daily.    .  ergocalciferol (VITAMIN D2) 1.25 MG (50000 UT) capsule Take 50,000 Units by mouth once a week.    Marland Kitchen glucose blood (BAYER CONTOUR NEXT TEST) test strip Use BID 100 each 12  . HYDROcodone-acetaminophen (NORCO) 10-325 MG tablet Take 1 tablet by mouth 4 (four) times daily as needed for severe pain. 30 tablet 0  . losartan (COZAAR) 25 MG tablet Take 25 mg by mouth daily.    . metFORMIN (GLUCOPHAGE) 1000 MG tablet Take 1,000 mg by mouth 2 (two) times daily.    . traZODone (DESYREL) 50 MG tablet      No current facility-administered medications for this encounter.    Physical Findings: The patient is in no acute distress. Patient is alert and oriented.  height is 5' 11.5" (1.816 m) and weight is 229 lb 4 oz (104 kg). Her temporal temperature is 97.2 F (36.2 C) (abnormal). Her blood pressure is 135/71 and her pulse is 61. Her respiration is 18 and oxygen saturation is 100%. .  No significant changes. Lungs are clear to auscultation bilaterally. Heart has regular rate and rhythm. No palpable cervical, supraclavicular, or axillary adenopathy. Abdomen soft, non-tender, normal bowel sounds. Right breast: no palpable mass, nipple discharge or bleeding. Left breast: Hyperpigmentation changes noted throughout the breast.  Skin is well-healed.  Patient does have edema in the breast mild diffuse in nature.  Induration is noted  at the lumpectomy cavity consistent with seroma in this region.  No signs of cellulitis or infection within the breast.  Overall the swelling has decreased since her last follow-up.  Is immediately after she received her first dose of the vaccine and she developed worsening of her swelling  Lab Findings: Lab Results  Component Value Date   WBC 5.4 07/09/2019   HGB 12.6 07/09/2019   HCT 39.5 07/09/2019   MCV 84.4 07/09/2019   PLT 238 07/09/2019    Radiographic Findings: No results found.  Impression: Stage IA (T1c, N0) Left Breast UIQ, Invasive Ductal Carcinoma with DCIS, ER+ /  PR+ / Her2-, Grade 2  Overall the swelling is less in the left breast since her last follow-up appointment.  She is however scheduled for an additional   dose of the Moderna vaccine next week we will see if this causes additional swelling in the breast.  She plans on having the vaccine in her right arm this time.    Plan: Patient will follow up in radiation oncology in 1 month.  If she continues to have persistent problems with breast edema then we will likely refer to physical therapy for this issue  ____________________________________   Blair Promise, PhD, MD  This document serves as a record of services personally performed by Gery Pray, MD. It was created on his behalf by Wilburn Mylar, a trained medical scribe. The creation of this record is based on the scribe's personal observations and the provider's statements to them. This document has been checked and approved by the attending provider.

## 2020-02-02 NOTE — Progress Notes (Signed)
SURVIVORSHIP VISIT:   BRIEF ONCOLOGIC HISTORY:  Oncology History  Malignant neoplasm of upper-inner quadrant of left breast in female, estrogen receptor positive ([place])  07/04/2019 Initial Diagnosis   Routine screening detected an irregular, indeterminate architectural distortion in the left breast. US showed a 1.0cm mass at the 9 o'clock position. Biopsy showed IDC, grade 2, HER-2 - (1+), ER+ 100%, PR+ 100%, Ki67 15%.    07/09/2019 Cancer Staging   Staging form: Breast, AJCC 8th Edition - Clinical stage from 07/09/2019: Stage IA (cT1c, cN0, cM0, G2, ER+, PR+, HER2-)    07/29/2019 Surgery   Left lumpectomy Kathryn Webb) [phone]): IDC with DCIS, grade 2, 1.5cm, clear margins, and one lymph node negative.    07/29/2019 Oncotype testing   Recurrence score: 6, distant recurrence at 9 years: 3%   09/17/2019 - 11/03/2019 Radiation Therapy   The patient initially received a dose of 50.4 Gy in 28 fractions to the breast using whole-breast tangent fields. This was delivered using a 3-D conformal technique. The pt received a boost delivering an additional 10 Gy in 5 fractions using a electron boost with 44mV electrons. The total dose was 60.4 Gy.    11/2019 - 11/2024 Anti-estrogen oral therapy   Anastrozole   01/18/2020 Cancer Staging   Staging form: Breast, AJCC 8th Edition - Pathologic: Stage IA (pT1c, pN0, cM0, G2, ER+, PR+, HER2-)       INTERVAL HISTORY:  Ms. RNultyto review her survivorship care plan detailing her treatment course for breast cancer, as well as monitoring long-term side effects of that treatment, education regarding health maintenance, screening, and overall wellness and health promotion.     Overall, Kathryn Webb reports feeling quite well.  She is taking Anastrozole daily and does note some hot flashes, worse at night.  Survivorship Survey complete.  She screened positive on distress and also on cognitive dysfunction.    REVIEW OF SYSTEMS:  Review of Systems  Constitutional:  Positive for fatigue. Negative for appetite change, chills and unexpected weight change.  HENT:   Negative for hearing loss, lump/mass, sore throat and trouble swallowing.   Eyes: Negative for eye problems and icterus.  Respiratory: Negative for chest tightness, cough and shortness of breath.   Cardiovascular: Negative for chest pain, leg swelling and palpitations.  Gastrointestinal: Negative for abdominal distention, abdominal pain, constipation, diarrhea, nausea and vomiting.  Endocrine: Positive for hot flashes.  Musculoskeletal: Negative for arthralgias.  Skin: Negative for itching and rash.  Neurological: Negative for dizziness, extremity weakness, headaches and numbness.  Hematological: Negative for adenopathy. Does not bruise/bleed easily.  Psychiatric/Behavioral: Positive for decreased concentration and sleep disturbance. The patient is not nervous/anxious.   Breast: Denies any new nodularity, masses, tenderness, nipple changes, or nipple discharge.      ONCOLOGY TREATMENT TEAM:  1. Surgeon[facility]  [facility] 2. Medical Oncologist: Dr. GLindi Adie 3. Radiation Oncologist: Dr. KSondra Come   PAST MEDICAL/SURGICAL HISTORY:  Past Medical History:  Diagnosis Date  . Acute medial meniscus tear   . Arthritis   . Cancer ([facility] 06/2019   left breast IDC  . Diabetes mellitus without complication ([place]   . GERD (gastroesophageal reflux disease)   . Hidradenitis suppurativa   . Serum lipids high    Past Surgical History:  Procedure Laterality Date  . AXILLARY HIDRADENITIS EXCISION    . BREAST LUMPECTOMY WITH RADIOACTIVE SEED AND SENTINEL LYMPH NODE BIOPSY Left 07/29/2019   Procedure: LEFT BREAST LUMPECTOMY WITH RADIOACTIVE SEED AND LEFT AXILLARY SENTINEL  LYMPH NODE BIOPSY;  Surgeon: Alphonsa Overall, MD;  Location: [facility];  Service: General;  Laterality: Left;  . CHONDROPLASTY Right 01/24/2017   Procedure: CHONDROPLASTY PATELLA  FEMORAL JOINT;  Surgeon:  Dorna Leitz, MD;  Location: [facility];  Service: Orthopedics;  Laterality: Right;  . KNEE ARTHROSCOPY WITH LATERAL MENISECTOMY Right 01/24/2017   Procedure: KNEE ARTHROSCOPY WITH LATERAL MENISECTOMY/ PARTIAL;  Surgeon: Dorna Leitz, MD;  Location: [facility];  Service: Orthopedics;  Laterality: Right;  . KNEE ARTHROSCOPY WITH MEDIAL MENISECTOMY Right 01/24/2017   Procedure: ARTHROSCOPY KNEE, PARTIAL MEDIAL  MENISCECTOMY,;  Surgeon: Dorna Leitz, MD;  Location: [facility];  Service: Orthopedics;  Laterality: Right;  . PERINEAL HIDRADENITIS EXCISION       ALLERGIES:  No Known Allergies   CURRENT MEDICATIONS:  Outpatient Encounter Medications as of 02/03/2020  Medication Sig  . anastrozole (ARIMIDEX) 1 MG tablet Take 1 tablet (1 mg total) by mouth daily.  . Ascorbic Acid (VITAMIN C WITH ROSE HIPS) 500 MG tablet Take 500 mg by mouth daily.  Marland Kitchen atorvastatin (LIPITOR) 20 MG tablet Take 20 mg by mouth daily.  . Blood Glucose Monitoring Suppl (CONTOUR NEXT EZ MONITOR) W/DEVICE KIT 1 Act by Does not apply route 2 (two) times daily.  . clonazePAM (KLONOPIN) 1 MG tablet Take 1 mg by mouth at bedtime.  . empagliflozin (JARDIANCE) 10 MG TABS tablet Take 10 mg by mouth daily.  . ergocalciferol (VITAMIN D2) 1.25 MG (50000 UT) capsule Take 50,000 Units by mouth once a week.  Marland Kitchen glucose blood (BAYER CONTOUR NEXT TEST) test strip Use BID  . HYDROcodone-acetaminophen (NORCO) 10-325 MG tablet Take 1 tablet by mouth 4 (four) times daily as needed for severe pain.  Marland Kitchen losartan (COZAAR) 25 MG tablet Take 25 mg by mouth daily.  . metFORMIN (GLUCOPHAGE) 1000 MG tablet Take 1,000 mg by mouth 2 (two) times daily.  . traZODone (DESYREL) 50 MG tablet    No facility-administered encounter medications on file as of 02/03/2020.     ONCOLOGIC FAMILY HISTORY:  Family History  Problem Relation Age of Onset  . Hypertension Father   . Kidney disease Father   . Diabetes Father   .  Heart disease Mother   . Hypertension Mother   . Diabetes Mother   . Diabetes Brother   . Cancer Paternal Uncle   . Stroke Neg Hx      GENETIC COUNSELING/TESTING: Not at this time  SOCIAL HISTORY:  Social History   Socioeconomic History  . Marital status: Single    Spouse name: Not on file  . Number of children: Not on file  . Years of education: 39  . Highest education level: Not on file  Occupational History  . Occupation: Facilities manager:  [employer]   Tobacco Use  . Smoking status: Former Smoker    Packs/day: 0.25  . Smokeless tobacco: Never Used  . Tobacco comment: quit 2 weeks ago and is taking chantix  Substance and Sexual Activity  . Alcohol use: Yes    Comment: social  . Drug use: No  . Sexual activity: Not Currently    Birth control/protection: Post-menopausal  Other Topics Concern  . Not on file  Social History Narrative   Regular exercise-yes   Caffiene Use-yes   Social Determinants of Health   Financial Resource Strain:   . Difficulty of Paying Living Expenses:   Food Insecurity:   . Worried About Estate manager/land agent  of Food in the Last Year:   . [place] in the Last Year:   Transportation Needs:   . Lack of Transportation (Medical):   Marland Kitchen Lack of Transportation (Non-Medical):   Physical Activity:   . Days of Exercise per Week:   . Minutes of Exercise per Session:   Stress:   . Feeling of Stress :   Social Connections:   . Frequency of Communication with Friends and Family:   . Frequency of Social Gatherings with Friends and Family:   . Attends Religious Services:   . Active Member of Clubs or Organizations:   . Attends Archivist Meetings:   Marland Kitchen Marital Status:   Intimate Partner Violence:   . Fear of Current or Ex-Partner:   . Emotionally Abused:   Marland Kitchen Physically Abused:   . Sexually Abused:      OBSERVATIONS/OBJECTIVE:  BP 134/73 (BP Location: Left Arm, Patient Position: Sitting)   Pulse (!) 59    Temp 98.1 F (36.7 C) (Temporal)   Resp 20   Ht 5' 11.5" (1.816 m)   Wt 229 lb 6.4 oz (104.1 kg)   LMP 11/20/2012   SpO2 98%   BMI 31.55 kg/m  GENERAL: Patient is a well appearing female in no acute distress HEENT:  Sclerae anicteric.  Oropharynx clear and moist. No ulcerations or evidence of oropharyngeal candidiasis. Neck is supple.  NODES:  No cervical, supraclavicular, or axillary lymphadenopathy palpated.  BREAST EXAM:  Right breast benign, left breast s/p lumpectomy and radiation, no sign of local recurrence LUNGS:  Clear to auscultation bilaterally.  No wheezes or rhonchi. HEART:  Regular rate and rhythm. No murmur appreciated. ABDOMEN:  Soft, nontender.  Positive, normoactive bowel sounds. No organomegaly palpated. MSK:  No focal spinal tenderness to palpation. Full range of motion bilaterally in the upper extremities. EXTREMITIES:  No peripheral edema.   SKIN:  Clear with no obvious rashes or skin changes. No nail dyscrasia. NEURO:  Nonfocal. Well oriented.  Appropriate affect.    LABORATORY DATA:  None for this visit.  DIAGNOSTIC IMAGING:  None for this visit.      ASSESSMENT AND PLAN:  Ms.. Webb is a pleasant 58 y.o. female with Stage IA left breast invasive ductal carcinoma, ER+/PR+/HER2-, diagnosed in 06/2019, treated with lumpectomy, adjuvant radiation therapy, and anti-estrogen therapy with Anastrozole beginning in 11/2019.  She presents to the Survivorship Clinic for our initial meeting and routine follow-up post-completion of treatment for breast cancer.    1. Stage IA left breast cancer:  Kathryn Webb is continuing to recover from definitive treatment for breast cancer. She will follow-up with her medical oncologist, Dr. Lindi Adie in 06/2020 with history and physical exam per surveillance protocol.  She will continue her anti-estrogen therapy with Anastrozole.  She does have some hot flashes that are worse at night and I recommended she take her anastrozole at night instead  of in the morning.  She plans on trying this.  Her mammogram is due 05/2020; orders placed today. Today, a comprehensive survivorship care plan and treatment summary was reviewed with the patient today detailing her breast cancer diagnosis, treatment course, potential late/long-term effects of treatment, appropriate follow-up care with recommendations for the future, and patient education resources.  A copy of this summary, along with a letter will be sent to the patient's primary care provider via mail/fax/In Basket message after today's visit.    2.  Distress screening: Screened positive, patient received additional information and resources in her  SCP packet.  She also completed a QOL-CSV survey that our social worker, Maximino Greenland will follow up on.    3.  Cognitive dysfunction: Patient has some cognitive changes.  I placed a referral to Dr. Mickeal Skinner, our neuro oncologist.    4. Bone health:  Given Ms. Cress's age/history of breast cancer and her current treatment regimen including anti-estrogen therapy with Anastrozole, she is at risk for bone demineralization.  Her last DEXA scan was 08/2017, and was normal.  I am ordering a repeat bone density for 05/2020 when her mammogram is due at [facility].  In the meantime, she was encouraged to increase her consumption of foods rich in calcium, as well as increase her weight-bearing activities.  She was given education on specific activities to promote bone health.  5. Cancer screening:  Due to Ms. Shinn's history and her age, she should receive screening for skin cancers, colon cancer, and gynecologic cancers.  The information and recommendations are listed on the patient's comprehensive care plan/treatment summary and were reviewed in detail with the patient.    6. Health maintenance and wellness promotion: Ms. Soucek was encouraged to consume 5-7 servings of fruits and vegetables per day. We reviewed the "Nutrition Rainbow" handout, as well as the handout "Take  Control of Your Health and Reduce Your Cancer Risk" from the [place].  She was also encouraged to engage in moderate to vigorous exercise for 30 minutes per day most days of the week. We discussed the [employer] fitness program, which is designed for cancer survivors to help them become more physically fit after cancer treatments.  She was instructed to limit her alcohol consumption and continue to abstain from tobacco use.     7. Support services/counseling: It is not uncommon for this period of the patient's cancer care trajectory to be one of many emotions and stressors.  We discussed how this can be increasingly difficult during the times of quarantine and social distancing due to the COVID-19 pandemic.   She was given information regarding our available services and encouraged to contact me with any questions or for help enrolling in any of our support group/programs.    Follow up instructions:    -Return to cancer center in 06/2020 for f/u with Dr. Lindi Adie  -Mammogram due in 05/2020 -Bone Density in 05/2020 -I will see her back in 12/2020  Total encounter time: 45 minutes*  Wilber Bihari, NP 02/03/20 12:31 PM Medical Oncology and Hematology [facility] [place], [place] [address] Tel. [phone]    Fax. [phone]  *Total Encounter Time as defined by the Centers for Medicare and Medicaid Services includes, in addition to the face-to-face time of a patient visit (documented in the note above) non-face-to-face time: obtaining and reviewing outside history, ordering and reviewing medications, tests or procedures, care coordination (communications with other health care professionals or caregivers) and documentation in the medical record.

## 2020-02-03 ENCOUNTER — Telehealth: Payer: Self-pay | Admitting: *Deleted

## 2020-02-03 ENCOUNTER — Encounter: Payer: Self-pay | Admitting: Adult Health

## 2020-02-03 ENCOUNTER — Other Ambulatory Visit: Payer: Self-pay

## 2020-02-03 ENCOUNTER — Inpatient Hospital Stay: Payer: BC Managed Care – PPO | Attending: Hematology and Oncology | Admitting: Adult Health

## 2020-02-03 ENCOUNTER — Ambulatory Visit: Payer: BC Managed Care – PPO | Attending: Family

## 2020-02-03 VITALS — BP 134/73 | HR 59 | Temp 98.1°F | Resp 20 | Ht 71.5 in | Wt 229.4 lb

## 2020-02-03 DIAGNOSIS — K219 Gastro-esophageal reflux disease without esophagitis: Secondary | ICD-10-CM | POA: Insufficient documentation

## 2020-02-03 DIAGNOSIS — Z87891 Personal history of nicotine dependence: Secondary | ICD-10-CM | POA: Insufficient documentation

## 2020-02-03 DIAGNOSIS — Z923 Personal history of irradiation: Secondary | ICD-10-CM | POA: Diagnosis not present

## 2020-02-03 DIAGNOSIS — C50212 Malignant neoplasm of upper-inner quadrant of left female breast: Secondary | ICD-10-CM

## 2020-02-03 DIAGNOSIS — Z170 Estrogen receptor positive status [ER+]: Secondary | ICD-10-CM

## 2020-02-03 DIAGNOSIS — R232 Flushing: Secondary | ICD-10-CM | POA: Diagnosis not present

## 2020-02-03 DIAGNOSIS — Z79811 Long term (current) use of aromatase inhibitors: Secondary | ICD-10-CM | POA: Diagnosis not present

## 2020-02-03 DIAGNOSIS — E119 Type 2 diabetes mellitus without complications: Secondary | ICD-10-CM | POA: Diagnosis not present

## 2020-02-03 DIAGNOSIS — Z7984 Long term (current) use of oral hypoglycemic drugs: Secondary | ICD-10-CM | POA: Diagnosis not present

## 2020-02-03 DIAGNOSIS — R5383 Other fatigue: Secondary | ICD-10-CM | POA: Diagnosis not present

## 2020-02-03 DIAGNOSIS — E2839 Other primary ovarian failure: Secondary | ICD-10-CM | POA: Diagnosis not present

## 2020-02-03 DIAGNOSIS — M129 Arthropathy, unspecified: Secondary | ICD-10-CM | POA: Insufficient documentation

## 2020-02-03 DIAGNOSIS — Z79899 Other long term (current) drug therapy: Secondary | ICD-10-CM | POA: Insufficient documentation

## 2020-02-03 DIAGNOSIS — Z23 Encounter for immunization: Secondary | ICD-10-CM

## 2020-02-03 NOTE — Telephone Encounter (Signed)
Orders for mammogram and bone density has been faxed over to [facility] for scheduling. Called pt to make aware last bone density scan was in 2018 and to look forward to [facility] calling to schedule appt. Pt verbalized understanding

## 2020-02-03 NOTE — Progress Notes (Signed)
   Covid-19 Vaccination Clinic  Name:  Kathryn Webb    MRN: [id number] DOB: 11-21-1960  02/03/2020  [name] was observed post Covid-19 immunization for 15 minutes without incident. She was provided with Vaccine Information Sheet and instruction to access the V-Safe system.   [name] was instructed to call 911 with any severe reactions post vaccine: Marland Kitchen Difficulty breathing  . Swelling of face and throat  . A fast heartbeat  . A bad rash all over body  . Dizziness and weakness   Immunizations Administered    Name Date Dose VIS Date Route   Moderna COVID-19 Vaccine 02/03/2020  1:28 PM 0.5 mL 10/21/2019 Intramuscular   Manufacturer: Moderna   Lot: [id number]   EdwardsPO:[id number]

## 2020-02-04 ENCOUNTER — Telehealth: Payer: Self-pay | Admitting: Adult Health

## 2020-02-04 NOTE — Telephone Encounter (Signed)
Scheduled appt per 3/16 los. Pt confirmed new appt date and time.

## 2020-02-05 ENCOUNTER — Encounter: Payer: Self-pay | Admitting: *Deleted

## 2020-02-09 ENCOUNTER — Encounter: Payer: Self-pay | Admitting: Licensed Clinical Social Worker

## 2020-02-09 NOTE — Progress Notes (Signed)
[facility] Quality of Life Screening Clinical Social Work  Clinical Social Work was referred by survivorship quality of life screening protocol.  The patient scored a 40.26 on the Quality of Life/ Cancer Survivor (QOL-CS) scale which indicates high quality of life.   Clinical Social Worker contacted patient by phone to assess for needs. Based on screener and patient report, family distress and sleep are concerns. Patient is coping fairly well overall using positive mindset and her faith. Family distress has been present as her fiance has also been experiencing his own medical issues (heart) and is trying to cope with that.   Sleep has always been a concern for her. She has started to take her medicine at night, cut-out coffee late in the day and exercise by walking the dog in the afternoon. [name] also stated that she usually sleeps on her stomach but is still unable to do so due to soreness and slight swelling in breast. She has been recommended PT before, but has felt guilty about utilizing it as she hasn't "had it as bad" as other people she knows. CSW provided support and helped reframe thinking. Encouraged use of resources as what she is experiencing is still real and interfering in her life.  [name] with questions on if she can take magnesium, calcium, and vitamin C supplements. Will pass these questions on to her medical team and have them contact her.   Follow up plan: 1. Continue to make changes in sleep hygiene. Contact CSW in a month if no improvement 2. Contact the office if you decide to move forward with PT 3. Follow-up QOL screen to be sent in 1 months.     Leshon Armistead E, LCSW

## 2020-02-10 ENCOUNTER — Telehealth: Payer: Self-pay

## 2020-02-10 NOTE — Telephone Encounter (Signed)
TC to pt per Mendel Ryder NP to let her know that it is ok to take those supplements ( magnesium, calcium, and vitamin C) with her meds. Pt verbalized understanding.

## 2020-02-16 ENCOUNTER — Telehealth: Payer: Self-pay | Admitting: Internal Medicine

## 2020-02-16 NOTE — Telephone Encounter (Signed)
Kathryn Webb has been cld and scheduled to see Dr. Mickeal Skinner on 4/6 at 10am. [name] aware to arrive 15 minutes early.

## 2020-02-17 ENCOUNTER — Encounter: Payer: Self-pay | Admitting: Adult Health

## 2020-02-24 ENCOUNTER — Inpatient Hospital Stay: Payer: BC Managed Care – PPO | Attending: Hematology and Oncology | Admitting: Internal Medicine

## 2020-03-01 ENCOUNTER — Telehealth: Payer: Self-pay

## 2020-03-01 NOTE — Telephone Encounter (Signed)
TC to patient per Mendel Ryder NP to let her know that her Bone density results were normal. Patient verbalized understanding. No further problems or concerns at this time.

## 2020-03-01 NOTE — Telephone Encounter (Signed)
Left voicemail for patient to call back [facility] 

## 2020-03-02 DIAGNOSIS — K5730 Diverticulosis of large intestine without perforation or abscess without bleeding: Secondary | ICD-10-CM | POA: Diagnosis not present

## 2020-03-02 DIAGNOSIS — Z86010 Personal history of colonic polyps: Secondary | ICD-10-CM | POA: Diagnosis not present

## 2020-03-02 DIAGNOSIS — Z8371 Family history of colonic polyps: Secondary | ICD-10-CM | POA: Diagnosis not present

## 2020-03-03 NOTE — Progress Notes (Signed)
Radiation Oncology         [phone] ________________________________  Name: Kathryn Webb MRN: [id number]  Date: 03/04/2020  DOB: 08-Mar-1961  Follow-Up Visit Note  CC: Lennie Odor, PA  Nicholas Lose, MD    ICD-10-CM   1. Malignant neoplasm of upper-inner quadrant of left breast in female, estrogen receptor positive ([place])  C50.212 Ambulatory referral to Physical Therapy   Z17.0     Diagnosis: Stage IA (T1c, N0) Left Breast UIQ, Invasive Ductal Carcinoma with DCIS, ER+ / PR+ / Her2-, Grade 2  Interval Since Last Radiation: Four months and one day.  Radiation Treatment Dates: 09/16/2019 through 11/03/2019 Site Technique Total Dose (Gy) Dose per Fx (Gy) Completed Fx Beam Energies  Breast, Left: Breast_Lt 3D 50.4/50.4 1.8 28/28 6X, 10X  Breast, Left: Breast_Lt_Bst 3D 10/10 2 5/5 6X, 10X    Narrative:  The patient returns today for routine follow-up. Since her last visit, she was seen by Wilber Bihari, oncology NP, on 02/03/2020 for Survivorship [facility]. At that time, the patient was noted to be doing quite well. She continues to take Anastrozole daily and did report some hot flashes that were worse at night.  Of note, the patient did receive her second dose of the COVID vaccination on 02/03/2020.  On review of systems, she reports chronic left breast pain rated 4/10.  she denies any nipple discharge or bleeding.  She continues to have problems with swelling in the left breast which causes chronic discomfort/pain.  This occasionally will cause difficulty with sleeping  ALLERGIES:  has No Known Allergies.  Meds: Current Outpatient Medications  Medication Sig Dispense Refill  . anastrozole (ARIMIDEX) 1 MG tablet Take 1 tablet (1 mg total) by mouth daily. 90 tablet 3  . Ascorbic Acid (VITAMIN C WITH ROSE HIPS) 500 MG tablet Take 500 mg by mouth daily.    Marland Kitchen atorvastatin (LIPITOR) 20 MG tablet Take 20 mg by mouth daily.    . Blood Glucose Monitoring Suppl (CONTOUR NEXT EZ  MONITOR) W/DEVICE KIT 1 Act by Does not apply route 2 (two) times daily. 2 kit 0  . clonazePAM (KLONOPIN) 1 MG tablet Take 1 mg by mouth at bedtime.    . empagliflozin (JARDIANCE) 10 MG TABS tablet Take 10 mg by mouth daily.    . ergocalciferol (VITAMIN D2) 1.25 MG (50000 UT) capsule Take 50,000 Units by mouth once a week.    Marland Kitchen glucose blood (BAYER CONTOUR NEXT TEST) test strip Use BID 100 each 12  . HYDROcodone-acetaminophen (NORCO) 10-325 MG tablet Take 1 tablet by mouth 4 (four) times daily as needed for severe pain. 30 tablet 0  . losartan (COZAAR) 25 MG tablet Take 25 mg by mouth daily.    . metFORMIN (GLUCOPHAGE) 1000 MG tablet Take 1,000 mg by mouth 2 (two) times daily.    . traZODone (DESYREL) 50 MG tablet      No current facility-administered medications for this encounter.    Physical Findings: The patient is in no acute distress. Patient is alert and oriented.  height is 5' 11.5" (1.816 m) and weight is 225 lb 6.4 oz (102.2 kg). Her temperature is 97.6 F (36.4 C). Her blood pressure is 149/66 (abnormal) and her pulse is 57 (abnormal). Her respiration is 20 and oxygen saturation is 100%. .  No significant changes. Lungs are clear to auscultation bilaterally. Heart has regular rate and rhythm. No palpable cervical, supraclavicular, or axillary adenopathy. Abdomen soft, non-tender, normal bowel sounds. Right breast: no palpable  mass, nipple discharge or bleeding. Left breast: Shows continued edema in the breast and hyperpigmentation changes.  Area of induration noted near the lumpectomy scar but no suspicious masses palpable  Lab Findings: Lab Results  Component Value Date   WBC 5.4 07/09/2019   HGB 12.6 07/09/2019   HCT 39.5 07/09/2019   MCV 84.4 07/09/2019   PLT 238 07/09/2019    Radiographic Findings: No results found.  Impression: Stage IA (T1c, N0) Left Breast UIQ, Invasive Ductal Carcinoma with DCIS, ER+ / PR+ / Her2-, Grade 2  Patient continues to have problems  with swelling and associated pain with swelling in the breast.  I have recommended that she proceed with physical therapy evaluation again to see if they can help with her breast lymphedema issues  Plan: Routine follow-up with radiation oncology in three months. The patient is scheduled to see Dr. Lindi Adie on 07/06/2020.  Physical therapy as above, order placed today.  ____________________________________   Blair Promise, PhD, MD  This document serves as a record of services personally performed by Gery Pray, MD. It was created on his behalf by Clerance Lav, a trained medical scribe. The creation of this record is based on the scribe's personal observations and the provider's statements to them. This document has been checked and approved by the attending provider.

## 2020-03-04 ENCOUNTER — Other Ambulatory Visit: Payer: Self-pay

## 2020-03-04 ENCOUNTER — Encounter: Payer: Self-pay | Admitting: Radiation Oncology

## 2020-03-04 ENCOUNTER — Telehealth: Payer: Self-pay | Admitting: Internal Medicine

## 2020-03-04 ENCOUNTER — Ambulatory Visit
Admission: RE | Admit: 2020-03-04 | Discharge: 2020-03-04 | Disposition: A | Discharge status: Home / Self Care | Payer: BC Managed Care – PPO | Source: Ambulatory Visit | Attending: Radiation Oncology | Admitting: Radiation Oncology

## 2020-03-04 VITALS — BP 149/66 | HR 57 | Temp 97.6°F | Resp 20 | Ht 71.5 in | Wt 225.4 lb

## 2020-03-04 DIAGNOSIS — C50212 Malignant neoplasm of upper-inner quadrant of left female breast: Secondary | ICD-10-CM | POA: Diagnosis not present

## 2020-03-04 DIAGNOSIS — N644 Mastodynia: Secondary | ICD-10-CM | POA: Insufficient documentation

## 2020-03-04 DIAGNOSIS — R232 Flushing: Secondary | ICD-10-CM | POA: Insufficient documentation

## 2020-03-04 DIAGNOSIS — Z08 Encounter for follow-up examination after completed treatment for malignant neoplasm: Secondary | ICD-10-CM | POA: Diagnosis not present

## 2020-03-04 DIAGNOSIS — Z170 Estrogen receptor positive status [ER+]: Secondary | ICD-10-CM | POA: Diagnosis not present

## 2020-03-04 DIAGNOSIS — Z79899 Other long term (current) drug therapy: Secondary | ICD-10-CM | POA: Diagnosis not present

## 2020-03-04 DIAGNOSIS — Z79811 Long term (current) use of aromatase inhibitors: Secondary | ICD-10-CM | POA: Diagnosis not present

## 2020-03-04 DIAGNOSIS — I890 Lymphedema, not elsewhere classified: Secondary | ICD-10-CM | POA: Diagnosis not present

## 2020-03-04 NOTE — Progress Notes (Signed)
Pt presents today for f/u with Dr. Sondra Come. Pt reports chronic pain in breast, rates 3-4/10. Pt is using vitamin E oil. Breast is faintly hyperpigmented and swollen. Area of hematoma continues to be firm.  BP (!) 149/66 (BP Location: Right Arm, Patient Position: Sitting, Cuff Size: Large)   Pulse (!) 57   Temp 97.6 F (36.4 C)   Resp 20   Ht 5' 11.5" (1.816 m)   Wt 225 lb 6.4 oz (102.2 kg)   LMP 11/20/2012   SpO2 100%   BMI 31.00 kg/m   Wt Readings from Last 3 Encounters:  03/04/20 225 lb 6.4 oz (102.2 kg)  02/03/20 229 lb 6.4 oz (104.1 kg)  01/29/20 229 lb 4 oz (104 kg)   Loma Sousa, RN BSN

## 2020-03-04 NOTE — Patient Instructions (Signed)
Coronavirus (COVID-19) Are you at risk?  Are you at risk for the Coronavirus (COVID-19)?  To be considered HIGH RISK for Coronavirus (COVID-19), you have to meet the following criteria:  . Traveled to [place], [place], [place], [place] or [place]; or in the [place] to [place], [place], [place], or [place]; and have fever, cough, and shortness of breath within the last 2 weeks of travel OR . Been in close contact with a person diagnosed with COVID-19 within the last 2 weeks and have fever, cough, and shortness of breath . IF YOU DO NOT MEET THESE CRITERIA, YOU ARE CONSIDERED LOW RISK FOR COVID-19.  What to do if you are HIGH RISK for COVID-19?  . If you are having a medical emergency, call 911. . Seek medical care right away. Before you go to a doctor's office, urgent care or emergency department, call ahead and tell them about your recent travel, contact with someone diagnosed with COVID-19, and your symptoms. You should receive instructions from your physician's office regarding next steps of care.  . When you arrive at healthcare provider, tell the healthcare staff immediately you have returned from visiting [place], [place], [place], [place] or [place]; or traveled in the [place] to [place], [place], [place], or [place]; in the last two weeks or you have been in close contact with a person diagnosed with COVID-19 in the last 2 weeks.   . Tell the health care staff about your symptoms: fever, cough and shortness of breath. . After you have been seen by a medical provider, you will be either: o Tested for (COVID-19) and discharged home on quarantine except to seek medical care if symptoms worsen, and asked to  - Stay home and avoid contact with others until you get your results (4-5 days)  - Avoid travel on public transportation if possible (such as bus, train, or airplane) or o Sent to the Emergency Department by EMS for evaluation, COVID-19 testing, and possible  admission depending on your condition and test results.  What to do if you are LOW RISK for COVID-19?  Reduce your risk of any infection by using the same precautions used for avoiding the common cold or flu:  . Wash your hands often with soap and warm water for at least 20 seconds.  If soap and water are not readily available, use an alcohol-based hand sanitizer with at least 60% alcohol.  . If coughing or sneezing, cover your mouth and nose by coughing or sneezing into the elbow areas of your shirt or coat, into a tissue or into your sleeve (not your hands). . Avoid shaking hands with others and consider head nods or verbal greetings only. . Avoid touching your eyes, nose, or mouth with unwashed hands.  . Avoid close contact with people who are sick. . Avoid places or events with large numbers of people in one location, like concerts or sporting events. . Carefully consider travel plans you have or are making. . If you are planning any travel outside or inside the [place], visit the CDC's Travelers' Health webpage for the latest health notices. . If you have some symptoms but not all symptoms, continue to monitor at home and seek medical attention if your symptoms worsen. . If you are having a medical emergency, call 911.   ADDITIONAL HEALTHCARE OPTIONS FOR PATIENTS  [facility] / e-Visit: [email or web address]         [facility]: [phone]  [facility]: [phone]                   [facility]: [phone]   

## 2020-03-04 NOTE — Telephone Encounter (Signed)
Pt came in to reschedule her missed appt from 4/6, spoke with md nurse, it was okay to reschedule.  Patient wanted an appt in May.  Scheduled appt for May per pt request..  Pt aware of the new appt date and time.

## 2020-03-09 ENCOUNTER — Ambulatory Visit: Payer: BC Managed Care – PPO | Admitting: Physical Therapy

## 2020-03-22 ENCOUNTER — Other Ambulatory Visit: Payer: Self-pay

## 2020-03-22 ENCOUNTER — Ambulatory Visit: Payer: BC Managed Care – PPO | Attending: Radiation Oncology | Admitting: Physical Therapy

## 2020-03-22 DIAGNOSIS — R600 Localized edema: Secondary | ICD-10-CM | POA: Insufficient documentation

## 2020-03-22 DIAGNOSIS — M25611 Stiffness of right shoulder, not elsewhere classified: Secondary | ICD-10-CM | POA: Diagnosis not present

## 2020-03-22 DIAGNOSIS — M6281 Muscle weakness (generalized): Secondary | ICD-10-CM | POA: Diagnosis not present

## 2020-03-22 DIAGNOSIS — L599 Disorder of the skin and subcutaneous tissue related to radiation, unspecified: Secondary | ICD-10-CM | POA: Insufficient documentation

## 2020-03-22 DIAGNOSIS — R293 Abnormal posture: Secondary | ICD-10-CM | POA: Diagnosis not present

## 2020-03-22 DIAGNOSIS — M25511 Pain in right shoulder: Secondary | ICD-10-CM | POA: Insufficient documentation

## 2020-03-22 DIAGNOSIS — G8929 Other chronic pain: Secondary | ICD-10-CM | POA: Diagnosis not present

## 2020-03-22 DIAGNOSIS — Z483 Aftercare following surgery for neoplasm: Secondary | ICD-10-CM | POA: Insufficient documentation

## 2020-03-22 NOTE — Therapy (Signed)
[place], [place], [address] Phone: [phone]   Fax:  [phone]  Physical Therapy Evaluation  Patient Details  Name: Kathryn Webb MRN: [id number] Date of Birth: 1961/06/21 Referring Provider (PT): Dr. Sondra Come    Encounter Date: 03/22/2020  PT End of Session - 03/22/20 1020    Visit Number  1    Number of Visits  9    Date for PT Re-Evaluation  04/23/20    PT Start Time  0900    PT Stop Time  1000    PT Time Calculation (min)  60 min    Activity Tolerance  Patient tolerated treatment well    Behavior During Therapy  [facility] for tasks assessed/performed       Past Medical History:  Diagnosis Date  . Acute medial meniscus tear   . Arthritis   . Cancer ([place]) 06/2019   left breast IDC  . Diabetes mellitus without complication ([place])   . GERD (gastroesophageal reflux disease)   . Hidradenitis suppurativa   . Serum lipids high     Past Surgical History:  Procedure Laterality Date  . AXILLARY HIDRADENITIS EXCISION    . BREAST LUMPECTOMY WITH RADIOACTIVE SEED AND SENTINEL LYMPH NODE BIOPSY Left 07/29/2019   Procedure: LEFT BREAST LUMPECTOMY WITH RADIOACTIVE SEED AND LEFT AXILLARY SENTINEL LYMPH NODE BIOPSY;  Surgeon: Alphonsa Overall, MD;  Location: [facility];  Service: General;  Laterality: Left;  . CHONDROPLASTY Right 01/24/2017   Procedure: CHONDROPLASTY PATELLA  FEMORAL JOINT;  Surgeon: Dorna Leitz, MD;  Location: [facility];  Service: Orthopedics;  Laterality: Right;  . KNEE ARTHROSCOPY WITH LATERAL MENISECTOMY Right 01/24/2017   Procedure: KNEE ARTHROSCOPY WITH LATERAL MENISECTOMY/ PARTIAL;  Surgeon: Dorna Leitz, MD;  Location: [facility];  Service: Orthopedics;  Laterality: Right;  . KNEE ARTHROSCOPY WITH MEDIAL MENISECTOMY Right 01/24/2017   Procedure: ARTHROSCOPY KNEE, PARTIAL MEDIAL  MENISCECTOMY,;  Surgeon: Dorna Leitz, MD;  Location: [facility];  Service: Orthopedics;  Laterality: Right;  . PERINEAL HIDRADENITIS EXCISION      There were no vitals filed for this visit.   Subjective Assessment - 03/22/20 0906    Subjective  Pt is here because she is till having pain in her left breast that is causing her to have difficulty with sleep She normally sleeps on her stomach and would like to be able to do that again    Pertinent History  Patient was diagnosed on 06/11/2019 with left grade II invasive ductal carcinoma breast cancer. Patient underwent a left lumpectomy on 07/29/2019 with 1 negative lymph node removed. It is ER/PR positive and HER2 negative with a Ki67 of 15%. She had a right total knee replacement in 2018. She completed radiation in December and is anastrozole,  She is experiencing diffciutly with sleep. Her right shoulde is better after getting a cortisone shot She is wearing a compression bra some of the time, but it may not be tight enough    Patient Stated Goals  to be able to sleep on my stomach again    Currently in Pain?  No/denies   she does have pain in breast no rhyme or reason        [facility] PT Assessment - 03/22/20 0901      Assessment   Medical Diagnosis  s/p left lumpectomy SLNB    Referring Provider (PT)  Dr. Sondra Come     Onset Date/Surgical Date  07/29/19  Hand Dominance  Right    Prior Therapy  baselines and PT for left breast lymphedema and right shoulder pain       Precautions   Precautions  Other (comment)    Precaution Comments  at risk for lymphedema       Restrictions   Weight Bearing Restrictions  No      Balance Screen   Has the patient fallen in the past 6 months  No    Has the patient had a decrease in activity level because of a fear of falling?   No    Is the patient reluctant to leave their home because of a fear of falling?   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    Leisure  She is walking 30-45 minutes daily      Cognition   Overall Cognitive Status  Within Functional Limits for tasks assessed      Observation/Other Assessments   Observations  left breast is darker with enlarged pores and red skin at inframammary fold.  Pt with enlarged pores and entires breast appears fuller     Other Surveys   --   Lymphedema Life Impact Scale: 25     Posture/Postural Control   Posture/Postural Control  Postural limitations    Postural Limitations  Rounded Shoulders;Forward head      AROM   AROM Assessment Site  Shoulder    Right/Left Shoulder  Right;Left    Right Shoulder Extension  --    Right Shoulder Flexion  143 Degrees    Right Shoulder ABduction  75 Degrees   still limited and painful    Right Shoulder Internal Rotation  80 Degrees   Difficulty obtaining position   Left Shoulder Extension  --    Left Shoulder Flexion  165 Degrees    Left Shoulder ABduction  165 Degrees    Left Shoulder Internal Rotation  --    Left Shoulder External Rotation  --      Strength   Overall Strength  Deficits    Overall Strength Comments  general deconditioning and right shoulder is limited by pain       Palpation   Patella mobility  --    Palpation comment  palpable fullness at left medial breast medial to insicion  Pt very tender to touch at inframammary area with congestion percieved.       Empty Can test   Findings  --    Side  --    Comment  --        LYMPHEDEMA/ONCOLOGY QUESTIONNAIRE - 03/22/20 0956      Left Upper Extremity Lymphedema   10 cm Proximal to Olecranon Process  31.5 cm    Olecranon Process  26 cm    10 cm Proximal to Ulnar Styloid Process  21.1 cm    Just Proximal to Ulnar Styloid Process  16.4 cm    Across Hand at [employer]  19.4 cm    At [place] of 2nd Digit  6.1 cm              Outpatient Rehab from 03/22/2020 in [facility]  Lymphedema Life Impact Scale Total Score  25 %      Objective measurements completed on examination: See above findings.      [facility] PT Treatment/Exercise - 03/22/20 0901      Manual Therapy   Manual Therapy  Edema management;Manual Lymphatic Drainage (MLD)    Edema Management  small dotted foam in L shaper to cover medial and inferior breast to wear in bra and encouraged pt to wear compression bra for a few hours at home     Manual Lymphatic Drainage (MLD)  MLD for the Lt breast with step by step instructions and pt performing following each movement with 5 deep breathes followed by stimulation of R axillary lymph nodes and L inguinal lymph nodes, anterior inter-axillary anastomosis to the R, axillo-inguinal anastomosis on the L,superior breast toward inter-axillary anastomosis and inferior breast toward L aillo-inguinal anastomosis followed by re-working all surfaces; hand over hand technique used at times.               PT Short Term Goals - 09/15/19 1602      PT SHORT TERM GOAL #1   Title  Patient will be independent with a HEP for right shoulder ROM and posture.    Status  Achieved      PT SHORT TERM GOAL #2   Title  Patient will improve DASH score to </= 11.36 for improved overall shoulder function on the right side.    Baseline  47.73 at re-eval 08/25/2019; unchanged    Status  Not Met      PT SHORT TERM GOAL #3   Title  Patient will report >/= 40% less right shoulder pain with daily tasks including household tasks and work tasks.    Baseline  pt still has shoulder  pain; pain unchanged, pt plans on seeing an orthopedist - 09/15/19    Status  Not Met      PT SHORT TERM GOAL #4   Title  Patient will report >/= 25% less difficulty donning and doffing her sports bra.    Status  Not Met      PT SHORT TERM GOAL #5   Title  Patient will increase right shoulder  strength to >/= 4+/5 for improved ability to perform daily tasks.    Baseline  unchanged    Status  Not Met        PT Long Term Goals - 03/22/20 1032      PT LONG TERM GOAL #1   Title  Pt will report that the pain in her left breast is decreased by 50% and that she is able to sleep semi-prone with pillow support    Time  4    Period  Weeks    Status  New      PT LONG TERM GOAL #2   Title  Pt will decrease lymphedema life impact scale to 18    Baseline  25 on 03/22/2020    Time  4    Period  Weeks    Status  New      PT LONG TERM GOAL #3   Title  Pt will report she is independent in a home exercise for shoulder and trunk stretching to stimulate lymphatic flow    Time  4    Period  Weeks      PT LONG TERM GOAL #4   Title  Pt will report she knows what to do to manage breast lymphedema at home    Time  4    Period  Weeks    Status  New             Plan - 03/22/20 1020    Clinical Impression Statement  Pt retuns to PT after completion of radiation about 4 months ago with persitent swelling and pain in left breast.  She has been doing self MLD and wearing compression bra as toleterated ( it is painful at times) but still has pain and fullness.  She would like to try consistent MLD in our clinic for 4 weeks before deciding if she needs to pursue a pump. She does not have any increased circumference in left arm. Pt tolerated treatment today and felt better at end of session    Personal Factors and Comorbidities  Comorbidity 3+    Comorbidities  previous surgery complicated with hematoma in breast,  radiation, DM .    Examination-Activity Limitations  Sleep;Reach Overhead    Stability/Clinical Decision Making  Stable/Uncomplicated    Clinical Decision Making  Low    Rehab Potential  Good    PT Frequency  2x / week    PT Duration  4 weeks    PT Treatment/Interventions  ADLs/Self Care Home Management;Therapeutic exercise;Patient/family education;Neuromuscular  re-education;Manual techniques;Passive range of motion;Taping;Manual lymph drainage;Moist Heat;Cryotherapy;Joint Manipulations;Scar mobilization;Compression bandaging    PT Next Visit Plan  Perform Self MLD with focus on congestion in left breast especially in medial and inframammary areas    Consulted and Agree with Plan of Care  Patient       Patient will benefit from skilled therapeutic intervention in order to improve the following deficits and impairments:  Pain, Impaired UE functional use, Decreased strength, Postural dysfunction, Increased edema, Impaired perceived functional ability, Increased fascial restricitons  Visit Diagnosis: Disorder of the skin and subcutaneous tissue related to radiation, unspecified - Plan: PT plan of care cert/re-cert  Chronic right shoulder pain - Plan: PT plan of care cert/re-cert  Localized edema - Plan: PT plan of care cert/re-cert  Abnormal posture - Plan: PT plan of care cert/re-cert  Stiffness of right shoulder, not elsewhere classified - Plan: PT plan of care cert/re-cert  Muscle weakness (generalized) - Plan: PT plan of care cert/re-cert     Problem List Patient Active Problem List   Diagnosis Date Noted  . Malignant neoplasm of upper-inner quadrant of left breast in female, estrogen receptor positive ([place]) 07/04/2019  . Acute medial meniscal tear, right, initial encounter 01/24/2017  . Chondromalacia patellae, right knee 01/24/2017  . Acute lateral meniscal tear, right, initial encounter 01/24/2017  . Hyperlipidemia LDL goal < 100 03/11/2014  . Dysuria 03/11/2014  . Tobacco abuse 03/11/2014  . Routine general medical examination at a health care facility 03/11/2014  . Hypercalcemia 03/11/2014  . Need for pneumococcal vaccination 03/11/2014  . Other and unspecified hyperlipidemia 03/14/2013  . Diabetes mellitus type 2 in obese ([facility]) 03/14/2013   Donato Heinz. Owens Shark PT  Norwood Levo 03/22/2020, 10:39 AM  [place], [place], [address] Phone: [phone]   Fax:  [phone]  Name: Kathryn Webb MRN: [id number] Date of Birth: Sep 22, 1961

## 2020-03-26 ENCOUNTER — Other Ambulatory Visit: Payer: Self-pay

## 2020-03-26 ENCOUNTER — Inpatient Hospital Stay: Payer: BC Managed Care – PPO | Attending: Hematology and Oncology | Admitting: Internal Medicine

## 2020-03-26 DIAGNOSIS — Z7984 Long term (current) use of oral hypoglycemic drugs: Secondary | ICD-10-CM | POA: Insufficient documentation

## 2020-03-26 DIAGNOSIS — Z79811 Long term (current) use of aromatase inhibitors: Secondary | ICD-10-CM | POA: Insufficient documentation

## 2020-03-26 DIAGNOSIS — K219 Gastro-esophageal reflux disease without esophagitis: Secondary | ICD-10-CM | POA: Insufficient documentation

## 2020-03-26 DIAGNOSIS — Z170 Estrogen receptor positive status [ER+]: Secondary | ICD-10-CM | POA: Diagnosis not present

## 2020-03-26 DIAGNOSIS — E119 Type 2 diabetes mellitus without complications: Secondary | ICD-10-CM | POA: Insufficient documentation

## 2020-03-26 DIAGNOSIS — Z79899 Other long term (current) drug therapy: Secondary | ICD-10-CM | POA: Diagnosis not present

## 2020-03-26 DIAGNOSIS — F419 Anxiety disorder, unspecified: Secondary | ICD-10-CM | POA: Diagnosis not present

## 2020-03-26 DIAGNOSIS — M129 Arthropathy, unspecified: Secondary | ICD-10-CM | POA: Insufficient documentation

## 2020-03-26 DIAGNOSIS — Z87891 Personal history of nicotine dependence: Secondary | ICD-10-CM | POA: Diagnosis not present

## 2020-03-26 DIAGNOSIS — F09 Unspecified mental disorder due to known physiological condition: Secondary | ICD-10-CM

## 2020-03-26 DIAGNOSIS — Z923 Personal history of irradiation: Secondary | ICD-10-CM | POA: Insufficient documentation

## 2020-03-26 DIAGNOSIS — C50212 Malignant neoplasm of upper-inner quadrant of left female breast: Secondary | ICD-10-CM | POA: Diagnosis not present

## 2020-03-26 NOTE — Progress Notes (Signed)
[facility] [place], [place] [address] [phone]   Cognitive Survivorship Evaluation  Date of Service: 03/26/20 Patient Name: Kathryn Webb Patient MRN: [id number] Patient DOB: [birth year] Provider: Ventura Sellers, MD  Identifying Statement:  Kathryn Webb is a 59 y.o. female who presents for initial consultation and evaluation regarding cancer associated cognitive decline.    Referring Provider: Lennie Odor, [facility] [employer] [address] [place],  [place] [address]  Primary Cancer:  Oncologic History: Oncology History  Malignant neoplasm of upper-inner quadrant of left breast in female, estrogen receptor positive ([facility])  07/04/2019 Initial Diagnosis   Routine screening detected an irregular, indeterminate architectural distortion in the left breast. US showed a 1.0cm mass at the 9 o'clock position. Biopsy showed IDC, grade 2, HER-2 - (1+), ER+ 100%, PR+ 100%, Ki67 15%.    07/09/2019 Cancer Staging   Staging form: Breast, AJCC 8th Edition - Clinical stage from 07/09/2019: Stage IA (cT1c, cN0, cM0, G2, ER+, PR+, HER2-)    07/29/2019 Surgery   Left lumpectomy [name]) [phone]): IDC with DCIS, grade 2, 1.5cm, clear margins, and one lymph node negative.    07/29/2019 Oncotype testing   Recurrence score: 6, distant recurrence at 9 years: 3%   09/17/2019 - 11/03/2019 Radiation Therapy   The patient initially received a dose of 50.4 Gy in 28 fractions to the breast using whole-breast tangent fields. This was delivered using a 3-D conformal technique. The pt received a boost delivering an additional 10 Gy in 5 fractions using a electron boost with 105mV electrons. The total dose was 60.4 Gy.    11/2019 - 11/2024 Anti-estrogen oral therapy   Anastrozole   01/18/2020 Cancer Staging   Staging form: Breast, AJCC 8th Edition - Pathologic: Stage IA (pT1c, pN0, cM0, G2, ER+, PR+, HER2-)       History of Present Illness: The  patient's records from the referring physician were obtained and reviewed and the patient interviewed to confirm this HPI.  Kathryn Webb today to discuss cognitive changes since beginning treatment for breast cancer, which has included radiation and hormonal therapy.  She describes modest impairment in attention, processing speed and short term memory.  There is increased anxiety and mood lability as well. It is more difficult for her to work because of cognitive issues.  Otherwise denies focal complaints, no seizures, headaches.  Medications: Current Outpatient Medications on File Prior to Visit  Medication Sig Dispense Refill  . anastrozole (ARIMIDEX) 1 MG tablet Take 1 tablet (1 mg total) by mouth daily. 90 tablet 3  . atorvastatin (LIPITOR) 20 MG tablet Take 20 mg by mouth daily.    . Blood Glucose Monitoring Suppl (CONTOUR NEXT EZ MONITOR) W/DEVICE KIT 1 Act by Does not apply route 2 (two) times daily. 2 kit 0  . glucose blood (BAYER CONTOUR NEXT TEST) test strip Use BID 100 each 12  . losartan (COZAAR) 25 MG tablet Take 25 mg by mouth daily.    . metFORMIN (GLUCOPHAGE) 1000 MG tablet Take 1,000 mg by mouth 2 (two) times daily.     No current facility-administered medications on file prior to visit.    Allergies: No Known Allergies Past Medical History:  Past Medical History:  Diagnosis Date  . Acute medial meniscus tear   . Arthritis   . Cancer ([facility] 06/2019   left breast IDC  . Diabetes mellitus without complication ([facility]   . GERD (  gastroesophageal reflux disease)   . Hidradenitis suppurativa   . Serum lipids high    Past Surgical History:  Past Surgical History:  Procedure Laterality Date  . AXILLARY HIDRADENITIS EXCISION    . BREAST LUMPECTOMY WITH RADIOACTIVE SEED AND SENTINEL LYMPH NODE BIOPSY Left 07/29/2019   Procedure: LEFT BREAST LUMPECTOMY WITH RADIOACTIVE SEED AND LEFT AXILLARY SENTINEL LYMPH NODE BIOPSY;  Surgeon: Alphonsa Overall, MD;  Location: [facility];  Service: General;  Laterality: Left;  . CHONDROPLASTY Right 01/24/2017   Procedure: CHONDROPLASTY PATELLA  FEMORAL JOINT;  Surgeon: Dorna Leitz, MD;  Location: [facility];  Service: Orthopedics;  Laterality: Right;  . KNEE ARTHROSCOPY WITH LATERAL MENISECTOMY Right 01/24/2017   Procedure: KNEE ARTHROSCOPY WITH LATERAL MENISECTOMY/ PARTIAL;  Surgeon: Dorna Leitz, MD;  Location: [facility];  Service: Orthopedics;  Laterality: Right;  . KNEE ARTHROSCOPY WITH MEDIAL MENISECTOMY Right 01/24/2017   Procedure: ARTHROSCOPY KNEE, PARTIAL MEDIAL  MENISCECTOMY,;  Surgeon: Dorna Leitz, MD;  Location: [facility];  Service: Orthopedics;  Laterality: Right;  . PERINEAL HIDRADENITIS EXCISION     Social History:  Social History   Socioeconomic History  . Marital status: Single    Spouse name: Not on file  . Number of children: Not on file  . Years of education: 9  . Highest education level: Not on file  Occupational History  . Occupation: Facilities manager: [employer]   Tobacco Use  . Smoking status: Former Smoker    Packs/day: 0.25  . Smokeless tobacco: Never Used  . Tobacco comment: quit 2 weeks ago and is taking chantix  Substance and Sexual Activity  . Alcohol use: Yes    Comment: social  . Drug use: No  . Sexual activity: Not Currently    Birth control/protection: Post-menopausal  Other Topics Concern  . Not on file  Social History Narrative   Regular exercise-yes   Caffiene Use-yes   Social Determinants of Health   Financial Resource Strain:   . Difficulty of Paying Living Expenses:   Food Insecurity:   . Worried About Charity fundraiser in the Last Year:   . Arboriculturist in the Last Year:   Transportation Needs:   . Film/video editor (Medical):   Marland Kitchen Lack of Transportation (Non-Medical):   Physical Activity:   . Days of Exercise per Week:   . Minutes of Exercise per Session:   Stress:     . Feeling of Stress :   Social Connections:   . Frequency of Communication with Friends and Family:   . Frequency of Social Gatherings with Friends and Family:   . Attends Religious Services:   . Active Member of Clubs or Organizations:   . Attends Archivist Meetings:   Marland Kitchen Marital Status:   Intimate Partner Violence:   . Fear of Current or Ex-Partner:   . Emotionally Abused:   Marland Kitchen Physically Abused:   . Sexually Abused:    Family History:  Family History  Problem Relation Age of Onset  . Hypertension Father   . Kidney disease Father   . Diabetes Father   . Heart disease Mother   . Hypertension Mother   . Diabetes Mother   . Diabetes Brother   . Cancer Paternal Uncle   . Stroke Neg Hx     Review of Systems: Constitutional: Doesn't report fevers, chills or abnormal weight loss Eyes: Doesn't report blurriness of vision Ears,  nose, mouth, throat, and face: Doesn't report sore throat Respiratory: Doesn't report cough, dyspnea or wheezes Cardiovascular: Doesn't report palpitation, chest discomfort  Gastrointestinal:  Doesn't report nausea, constipation, diarrhea GU: Doesn't report incontinence Skin: Doesn't report skin rashes Neurological: Per HPI Musculoskeletal: Doesn't report joint pain Behavioral/Psych: +anxiety  Physical Exam: Vitals:   03/26/20 1021  BP: (!) 144/71  Pulse: 63  Resp: 18  Temp: (!) 97.4 F (36.3 C)  SpO2: 99%   General: Alert, cooperative, pleasant, in no acute distress Head: Normal EENT: No conjunctival injection or scleral icterus.  Lungs: Resp effort normal Cardiac: Regular rate Abdomen: Non-distended abdomen Skin: No rashes cyanosis or petechiae. Extremities: No clubbing or edema  Neurologic Exam: Mental Status: Awake, alert, attentive to examiner. Oriented to self and environment. Language is fluent with intact comprehension.  Cranial Nerves: Visual acuity is grossly normal. Visual fields are full. Extra-ocular movements  intact. No ptosis. Face is symmetric Motor: Tone and bulk are normal. Power is full in both arms and legs.  Sensory: Intact to light touch Gait: Normal.   Labs: I have reviewed the data as listed    Component Value Date/Time   NA 139 07/09/2019 0831   K 4.3 07/09/2019 0831   CL 103 07/09/2019 0831   CO2 26 07/09/2019 0831   GLUCOSE 114 (H) 07/09/2019 0831   BUN 13 07/09/2019 0831   CREATININE 0.75 07/09/2019 0831   CALCIUM 11.4 (H) 07/09/2019 0831   PROT 8.1 07/09/2019 0831   ALBUMIN 4.2 07/09/2019 0831   AST 17 07/09/2019 0831   ALT 18 07/09/2019 0831   ALKPHOS 118 07/09/2019 0831   BILITOT 0.4 07/09/2019 0831   GFRNONAA >60 07/09/2019 0831   GFRAA >60 07/09/2019 0831   Lab Results  Component Value Date   WBC 5.4 07/09/2019   NEUTROABS 2.4 07/09/2019   HGB 12.6 07/09/2019   HCT 39.5 07/09/2019   MCV 84.4 07/09/2019   PLT 238 07/09/2019     Assessment/Plan:  Cognitive Changes  Kathryn Webb presents with clinical syndrome 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     We provided counseling regarding healthy behaviors to maintain cognitive function, including exercise, diet, and positive outlook.  We discussed mindful relaxation and provided some methods to redirect anxiety without medication.   No further CNS workup or cognitive screening recommended at this time.  We spent twenty additional minutes teaching regarding the natural history, biology, and historical experience in the treatment of neurologic complications of cancer.   We appreciate the opportunity to participate in the care of Kathryn Webb.  We encouraged follow up for progressive cognitive issues or neurologic symptoms if they develop.  All questions were answered. The patient knows to call the clinic with any problems, questions or concerns. No barriers to learning were detected.  The total time spent in the encounter was 40 minutes and  more than 50% was on counseling and review of test results   Ventura Sellers, MD Medical Director of Neuro-Oncology [facility] 03/26/20 5:03 PM

## 2020-03-29 ENCOUNTER — Other Ambulatory Visit: Payer: Self-pay

## 2020-03-29 ENCOUNTER — Ambulatory Visit: Payer: BC Managed Care – PPO

## 2020-03-29 DIAGNOSIS — L599 Disorder of the skin and subcutaneous tissue related to radiation, unspecified: Secondary | ICD-10-CM | POA: Diagnosis not present

## 2020-03-29 DIAGNOSIS — M25611 Stiffness of right shoulder, not elsewhere classified: Secondary | ICD-10-CM

## 2020-03-29 DIAGNOSIS — G8929 Other chronic pain: Secondary | ICD-10-CM | POA: Diagnosis not present

## 2020-03-29 DIAGNOSIS — M25511 Pain in right shoulder: Secondary | ICD-10-CM | POA: Diagnosis not present

## 2020-03-29 DIAGNOSIS — R293 Abnormal posture: Secondary | ICD-10-CM

## 2020-03-29 DIAGNOSIS — Z483 Aftercare following surgery for neoplasm: Secondary | ICD-10-CM | POA: Diagnosis not present

## 2020-03-29 DIAGNOSIS — M6281 Muscle weakness (generalized): Secondary | ICD-10-CM

## 2020-03-29 DIAGNOSIS — R600 Localized edema: Secondary | ICD-10-CM

## 2020-03-29 NOTE — Patient Instructions (Signed)
Self manual lymph drainage:   Cancer Rehab [phone] Perform this sequence once a day.  Only give enough pressure to make your skin move.  Diaphragmatic - Supine   Inhale through nose making navel move out toward hands. Exhale through puckered lips, hands follow navel in. Repeat _5__ times. Rest _10__ seconds between repeats.   Hug yourself.  Do circles at your neck just above your collarbones.  Repeat this 10 times.  Axilla - One at a Time   Using full weight of flat hand and fingers at center of uninvolved armpit, make _10__ in-place circles.   LEG: Inguinal Nodes Stimulation   With small finger side of hand against hip crease on involved side, gently perform circles at the crease. Repeat __10_ times.   1) Axilla to Inguinal Nodes - Sweep   On involved side, sweep _4__ times from armpit along side of trunk to hip crease.  Now gently stretch skin from the involved side to the uninvolved side across the chest at the shoulder line.  Repeat that 4 times.  Draw an imaginary diagonal line from upper outer breast through the nipple area toward lower inner breast.  Direct fluid upward and inward from this line toward the pathway across your upper chest .  Do this in three rows to treat all of the upper inner breast tissue, and do each row 3-4x.      Direct fluid to treat all of lower outer breast tissue downward and outward toward  pathway that is aimed at the left groin.  Finish by doing the pathways as described above going from your involved armpit to the same side groin and going across your upper chest from the involved shoulder to the uninvolved shoulder.  Repeat the steps above where you do circles in your left groin and right armpit.

## 2020-03-29 NOTE — Therapy (Signed)
[place], [place], [address] Phone: [phone]   Fax:  [phone]  Physical Therapy Treatment  Patient Details  Name: Kathryn Webb MRN: [id number] Date of Birth: 02-Jan-1961 Referring Provider (PT): Dr. Sondra Come    Encounter Date: 03/29/2020  PT End of Session - 03/29/20 0906    Visit Number  2    Number of Visits  9    Date for PT Re-Evaluation  04/23/20    PT Start Time  0801    PT Stop Time  0906    PT Time Calculation (min)  65 min    Activity Tolerance  Patient tolerated treatment well    Behavior During Therapy  [facility] for tasks assessed/performed       Past Medical History:  Diagnosis Date  . Acute medial meniscus tear   . Arthritis   . Cancer ([facility]) 06/2019   left breast IDC  . Diabetes mellitus without complication ([facility])   . GERD (gastroesophageal reflux disease)   . Hidradenitis suppurativa   . Serum lipids high     Past Surgical History:  Procedure Laterality Date  . AXILLARY HIDRADENITIS EXCISION    . BREAST LUMPECTOMY WITH RADIOACTIVE SEED AND SENTINEL LYMPH NODE BIOPSY Left 07/29/2019   Procedure: LEFT BREAST LUMPECTOMY WITH RADIOACTIVE SEED AND LEFT AXILLARY SENTINEL LYMPH NODE BIOPSY;  Surgeon: Alphonsa Overall, MD;  Location: [facility];  Service: General;  Laterality: Left;  . CHONDROPLASTY Right 01/24/2017   Procedure: CHONDROPLASTY PATELLA  FEMORAL JOINT;  Surgeon: Dorna Leitz, MD;  Location: [facility];  Service: Orthopedics;  Laterality: Right;  . KNEE ARTHROSCOPY WITH LATERAL MENISECTOMY Right 01/24/2017   Procedure: KNEE ARTHROSCOPY WITH LATERAL MENISECTOMY/ PARTIAL;  Surgeon: Dorna Leitz, MD;  Location: [facility];  Service: Orthopedics;  Laterality: Right;  . KNEE ARTHROSCOPY WITH MEDIAL MENISECTOMY Right 01/24/2017   Procedure: ARTHROSCOPY KNEE, PARTIAL MEDIAL  MENISCECTOMY,;  Surgeon: Dorna Leitz, MD;  Location: [facility];  Service: Orthopedics;  Laterality: Right;  . PERINEAL HIDRADENITIS EXCISION      There were no vitals filed for this visit.  Subjective Assessment - 03/29/20 0805    Subjective  My Lt breast felt a bit better after last session but for a short time. I wore the foam she gave me just a little.    Pertinent History  Patient was diagnosed on 06/11/2019 with left grade II invasive ductal carcinoma breast cancer. Patient underwent a left lumpectomy on 07/29/2019 with 1 negative lymph node removed. It is ER/PR positive and HER2 negative with a Ki67 of 15%. She had a right total knee replacement in 2018. She completed radiation in December and is anastrozole,  She is experiencing diffciutly with sleep. Her right shoulde is better after getting a cortisone shot She is wearing a compression bra some of the time, but it may not be tight enough    Patient Stated Goals  to be able to sleep on my stomach again    Currently in Pain?  No/denies                  [facility]patient [facility]  Lymphedema Life Impact Scale Total Score  25 %           [facility] Adult PT Treatment/Exercise - 03/29/20 0001      Manual Therapy   Manual Lymphatic Drainage (MLD)  MLD for the Lt breast: Short  neck, 5 deep breathes, Rt axillary lymph nodes and Lt inguinal lymph nodes, anterior inter-axillary anastomosis, Lt axillo-inguinal anastomosis, superior breast toward inter-axillary anastomosis and inferior breast toward Lt axillo-inguinal anastomosis followed by re-working all surfaces, then into Rt S/L for further work at lateral breast and retracing pathways, then finsihed retracing pathways again in supine reviewing with pt throughout and having her return demo at breast and along pathways.                  PT Long Term Goals - 03/22/20 1032      PT LONG TERM GOAL #1   Title  Pt will report that the pain in her left breast is decreased by 50%  and that she is able to sleep semi-prone with pillow support    Time  4    Period  Weeks    Status  New      PT LONG TERM GOAL #2   Title  Pt will decrease lymphedema life impact scale to 18    Baseline  25 on 03/22/2020    Time  4    Period  Weeks    Status  New      PT LONG TERM GOAL #3   Title  Pt will report she is independent in a home exercise for shoulder and trunk stretching to stimulate lymphatic flow    Time  4    Period  Weeks      PT LONG TERM GOAL #4   Title  Pt will report she knows what to do to manage breast lymphedema at home    Time  4    Period  Weeks    Status  New            Plan - 03/29/20 1042    Clinical Impression Statement  Focused on manual lymph drainage to Lt breast reviewing with pt throughout and having her return demo. Also encouraged her to wear foam in her compression bra more frequently to see more consistent results. And discussed with her trying to be more proactive about self MLD for next 2 weeks, trying to perform 2x/day to better results. Pt verbalized understanding.    Personal Factors and Comorbidities  Comorbidity 3+    Comorbidities  previous surgery complicated with hematoma in breast,  radiation, DM .    Examination-Activity Limitations  Sleep;Reach Overhead    Stability/Clinical Decision Making  Stable/Uncomplicated    Rehab Potential  Good    PT Frequency  2x / week    PT Duration  4 weeks    PT Treatment/Interventions  ADLs/Self Care Home Management;Therapeutic exercise;Patient/family education;Neuromuscular re-education;Manual techniques;Passive range of motion;Taping;Manual lymph drainage;Moist Heat;Cryotherapy;Joint Manipulations;Scar mobilization;Compression bandaging    PT Next Visit Plan  Continue manual lymph drainage and reviewing technqiue with pt; focus on congestion in left breast especially in medial and inframammary areas    PT Home Exercise Plan  Self MLD daily, 2x if able    Consulted and Agree with Plan of Care   Patient       Patient will benefit from skilled therapeutic intervention in order to improve the following deficits and impairments:  Pain, Impaired UE functional use, Decreased strength, Postural dysfunction, Increased edema, Impaired perceived functional ability, Increased fascial restricitons  Visit Diagnosis: Disorder of the skin and subcutaneous tissue related to radiation, unspecified  Chronic right shoulder pain  Localized edema  Abnormal posture  Stiffness of right shoulder, not elsewhere classified  Muscle weakness (generalized)       Problem List Patient Active Problem List   Diagnosis Date Noted  . Cognitive dysfunction 03/26/2020  . Malignant neoplasm of upper-inner quadrant of left breast in female, estrogen receptor positive ([facility]) 07/04/2019  . Acute medial meniscal tear, right, initial encounter 01/24/2017  . Chondromalacia patellae, right knee 01/24/2017  . Acute lateral meniscal tear, right, initial encounter 01/24/2017  . Hyperlipidemia LDL goal < 100 03/11/2014  . Dysuria 03/11/2014  . Tobacco abuse 03/11/2014  . Routine general medical examination at a health care facility 03/11/2014  . Hypercalcemia 03/11/2014  . Need for pneumococcal vaccination 03/11/2014  . Other and unspecified hyperlipidemia 03/14/2013  . Diabetes mellitus type 2 in obese ([facility]) 03/14/2013    Webb, Kathryn Ann, PTA 03/29/2020, 10:47 AM  [facility] [address] [place], [place], [address] Phone: [phone]   Fax:  [phone]  Name: Kathryn Webb MRN: [id number] Date of Birth: 11/25/1960   

## 2020-03-31 ENCOUNTER — Ambulatory Visit: Payer: BC Managed Care – PPO | Admitting: Physical Therapy

## 2020-03-31 ENCOUNTER — Other Ambulatory Visit: Payer: Self-pay

## 2020-03-31 ENCOUNTER — Encounter: Payer: Self-pay | Admitting: Physical Therapy

## 2020-03-31 DIAGNOSIS — R293 Abnormal posture: Secondary | ICD-10-CM

## 2020-03-31 DIAGNOSIS — M6281 Muscle weakness (generalized): Secondary | ICD-10-CM | POA: Diagnosis not present

## 2020-03-31 DIAGNOSIS — L599 Disorder of the skin and subcutaneous tissue related to radiation, unspecified: Secondary | ICD-10-CM | POA: Diagnosis not present

## 2020-03-31 DIAGNOSIS — Z483 Aftercare following surgery for neoplasm: Secondary | ICD-10-CM | POA: Diagnosis not present

## 2020-03-31 DIAGNOSIS — R600 Localized edema: Secondary | ICD-10-CM | POA: Diagnosis not present

## 2020-03-31 DIAGNOSIS — M25511 Pain in right shoulder: Secondary | ICD-10-CM

## 2020-03-31 DIAGNOSIS — G8929 Other chronic pain: Secondary | ICD-10-CM

## 2020-03-31 DIAGNOSIS — M25611 Stiffness of right shoulder, not elsewhere classified: Secondary | ICD-10-CM | POA: Diagnosis not present

## 2020-03-31 NOTE — Therapy (Signed)
[place], [place], [address] Phone: [phone]   Fax:  [phone]  Physical Therapy Treatment  Patient Details  Name: Kathryn Webb MRN: [id number] Date of Birth: 1961-09-22 Referring Provider (PT): Dr. Sondra Come    Encounter Date: 03/31/2020  PT End of Session - 03/31/20 1559    Visit Number  3    Number of Visits  9    Date for PT Re-Evaluation  04/23/20    PT Start Time  1600    PT Stop Time  1645    PT Time Calculation (min)  45 min    Activity Tolerance  Patient tolerated treatment well    Behavior During Therapy  [facility] for tasks assessed/performed       Past Medical History:  Diagnosis Date  . Acute medial meniscus tear   . Arthritis   . Cancer ([place]) 06/2019   left breast IDC  . Diabetes mellitus without complication ([place])   . GERD (gastroesophageal reflux disease)   . Hidradenitis suppurativa   . Serum lipids high     Past Surgical History:  Procedure Laterality Date  . AXILLARY HIDRADENITIS EXCISION    . BREAST LUMPECTOMY WITH RADIOACTIVE SEED AND SENTINEL LYMPH NODE BIOPSY Left 07/29/2019   Procedure: LEFT BREAST LUMPECTOMY WITH RADIOACTIVE SEED AND LEFT AXILLARY SENTINEL LYMPH NODE BIOPSY;  Surgeon: Alphonsa Overall, MD;  Location: [facility];  Service: General;  Laterality: Left;  . CHONDROPLASTY Right 01/24/2017   Procedure: CHONDROPLASTY PATELLA  FEMORAL JOINT;  Surgeon: Dorna Leitz, MD;  Location: [facility];  Service: Orthopedics;  Laterality: Right;  . KNEE ARTHROSCOPY WITH LATERAL MENISECTOMY Right 01/24/2017   Procedure: KNEE ARTHROSCOPY WITH LATERAL MENISECTOMY/ PARTIAL;  Surgeon: Dorna Leitz, MD;  Location: [facility];  Service: Orthopedics;  Laterality: Right;  . KNEE ARTHROSCOPY WITH MEDIAL MENISECTOMY Right 01/24/2017   Procedure: ARTHROSCOPY KNEE, PARTIAL MEDIAL  MENISCECTOMY,;  Surgeon: Dorna Leitz, MD;  Location: [facility];  Service: Orthopedics;  Laterality: Right;  . PERINEAL HIDRADENITIS EXCISION      There were no vitals filed for this visit.  Subjective Assessment - 03/31/20 1558    Subjective  Pt reports she has been wearing her compression bra and the foam patch.  "My breast is not as heavy"    Pertinent History  Patient was diagnosed on 06/11/2019 with left grade II invasive ductal carcinoma breast cancer. Patient underwent a left lumpectomy on 07/29/2019 with 1 negative lymph node removed. It is ER/PR positive and HER2 negative with a Ki67 of 15%. She had a right total knee replacement in 2018. She completed radiation in December and is anastrozole,  She is experiencing diffciutly with sleep. Her right shoulde is better after getting a cortisone shot She is wearing a compression bra some of the time, but it may not be tight enough    Patient Stated Goals  to be able to sleep on my stomach again    Currently in Pain?  No/denies                   [facility]patient [facility]  Lymphedema Life Impact Scale Total Score  25 %           [facility] Adult PT Treatment/Exercise - 03/31/20 0001      Manual Therapy   Manual Lymphatic Drainage (MLD)  MLD for the Lt breast: Short neck, 5 deep breathes,  Rt axillary lymph nodes and Lt inguinal lymph nodes, anterior inter-axillary anastomosis, Lt axillo-inguinal anastomosis, superior breast toward inter-axillary anastomosis and inferior breast toward Lt axillo-inguinal anastomosis followed by re-working all surfaces, then into Rt S/L for further work at lateral breast and retracing pathways, then finsihed retracing pathways again in supine reviewing with pt throughout and having her return demo at breast and along pathways.                  PT Long Term Goals - 03/22/20 1032      PT LONG TERM GOAL #1   Title  Pt will report that the pain in her left breast is decreased by 50% and that she  is able to sleep semi-prone with pillow support    Time  4    Period  Weeks    Status  New      PT LONG TERM GOAL #2   Title  Pt will decrease lymphedema life impact scale to 18    Baseline  25 on 03/22/2020    Time  4    Period  Weeks    Status  New      PT LONG TERM GOAL #3   Title  Pt will report she is independent in a home exercise for shoulder and trunk stretching to stimulate lymphatic flow    Time  4    Period  Weeks      PT LONG TERM GOAL #4   Title  Pt will report she knows what to do to manage breast lymphedema at home    Time  4    Period  Weeks    Status  New            Plan - 03/31/20 1559    Clinical Impression Statement  Pt is having softening of the left breast with MLD  and use of compression. She still has tenderness at the inframammary area and palpable firmness that is tender in medial breast but is improved overall    Personal Factors and Comorbidities  Comorbidity 3+    Comorbidities  previous surgery complicated with hematoma in breast,  radiation, DM .    Stability/Clinical Decision Making  Stable/Uncomplicated    Rehab Potential  Good    PT Frequency  2x / week    PT Treatment/Interventions  ADLs/Self Care Home Management;Therapeutic exercise;Patient/family education;Neuromuscular re-education;Manual techniques;Passive range of motion;Taping;Manual lymph drainage;Moist Heat;Cryotherapy;Joint Manipulations;Scar mobilization;Compression bandaging    PT Next Visit Plan  Continue manual lymph drainage and reviewing technqiue with pt; focus on congestion in left breast especially in medial and inframammary areas    Consulted and Agree with Plan of Care  Patient       Patient will benefit from skilled therapeutic intervention in order to improve the following deficits and impairments:  Pain, Impaired UE functional use, Decreased strength, Postural dysfunction, Increased edema, Impaired perceived functional ability, Increased fascial restricitons  Visit  Diagnosis: Disorder of the skin and subcutaneous tissue related to radiation, unspecified  Chronic right shoulder pain  Localized edema  Abnormal posture  Stiffness of right shoulder, not elsewhere classified  Muscle weakness (generalized)     Problem List Patient Active Problem List   Diagnosis Date Noted  . Cognitive dysfunction 03/26/2020  . Malignant neoplasm of upper-inner quadrant of left breast in female, estrogen receptor positive ([place]) 07/04/2019  . Acute medial meniscal tear, right, initial encounter 01/24/2017  . Chondromalacia patellae, right knee 01/24/2017  . Acute lateral meniscal tear, right,  initial encounter 01/24/2017  . Hyperlipidemia LDL goal < 100 03/11/2014  . Dysuria 03/11/2014  . Tobacco abuse 03/11/2014  . Routine general medical examination at a health care facility 03/11/2014  . Hypercalcemia 03/11/2014  . Need for pneumococcal vaccination 03/11/2014  . Other and unspecified hyperlipidemia 03/14/2013  . Diabetes mellitus type 2 in obese ([place]) 03/14/2013   Donato Heinz. Owens Shark PT  Norwood Levo 03/31/2020, 4:02 PM  [place], [place], [address] Phone: [phone]   Fax:  [phone]  Name: Kathryn Webb MRN: [id number] Date of Birth: 11-25-1960

## 2020-04-05 ENCOUNTER — Other Ambulatory Visit: Payer: Self-pay

## 2020-04-05 ENCOUNTER — Ambulatory Visit: Payer: BC Managed Care – PPO

## 2020-04-05 DIAGNOSIS — R600 Localized edema: Secondary | ICD-10-CM | POA: Diagnosis not present

## 2020-04-05 DIAGNOSIS — M25511 Pain in right shoulder: Secondary | ICD-10-CM

## 2020-04-05 DIAGNOSIS — Z483 Aftercare following surgery for neoplasm: Secondary | ICD-10-CM | POA: Diagnosis not present

## 2020-04-05 DIAGNOSIS — G8929 Other chronic pain: Secondary | ICD-10-CM

## 2020-04-05 DIAGNOSIS — R293 Abnormal posture: Secondary | ICD-10-CM

## 2020-04-05 DIAGNOSIS — L599 Disorder of the skin and subcutaneous tissue related to radiation, unspecified: Secondary | ICD-10-CM

## 2020-04-05 DIAGNOSIS — M6281 Muscle weakness (generalized): Secondary | ICD-10-CM

## 2020-04-05 DIAGNOSIS — M25611 Stiffness of right shoulder, not elsewhere classified: Secondary | ICD-10-CM

## 2020-04-05 NOTE — Therapy (Signed)
[place], [place], [address] Phone: [phone]   Fax:  [phone]  Physical Therapy Treatment  Patient Details  Name: Kathryn Webb MRN: [id number] Date of Birth: March 02, 1961 Referring Provider (PT): Dr. Sondra Come    Encounter Date: 04/05/2020  PT End of Session - 04/05/20 1505    Visit Number  4    Number of Visits  9    Date for PT Re-Evaluation  04/23/20    PT Start Time  1219    PT Stop Time  1503    PT Time Calculation (min)  58 min    Activity Tolerance  Patient tolerated treatment well    Behavior During Therapy  [facility] for tasks assessed/performed       Past Medical History:  Diagnosis Date  . Acute medial meniscus tear   . Arthritis   . Cancer ([place]) 06/2019   left breast IDC  . Diabetes mellitus without complication ([place])   . GERD (gastroesophageal reflux disease)   . Hidradenitis suppurativa   . Serum lipids high     Past Surgical History:  Procedure Laterality Date  . AXILLARY HIDRADENITIS EXCISION    . BREAST LUMPECTOMY WITH RADIOACTIVE SEED AND SENTINEL LYMPH NODE BIOPSY Left 07/29/2019   Procedure: LEFT BREAST LUMPECTOMY WITH RADIOACTIVE SEED AND LEFT AXILLARY SENTINEL LYMPH NODE BIOPSY;  Surgeon: Alphonsa Overall, MD;  Location: [facility];  Service: General;  Laterality: Left;  . CHONDROPLASTY Right 01/24/2017   Procedure: CHONDROPLASTY PATELLA  FEMORAL JOINT;  Surgeon: Dorna Leitz, MD;  Location: [facility];  Service: Orthopedics;  Laterality: Right;  . KNEE ARTHROSCOPY WITH LATERAL MENISECTOMY Right 01/24/2017   Procedure: KNEE ARTHROSCOPY WITH LATERAL MENISECTOMY/ PARTIAL;  Surgeon: Dorna Leitz, MD;  Location: [facility];  Service: Orthopedics;  Laterality: Right;  . KNEE ARTHROSCOPY WITH MEDIAL MENISECTOMY Right 01/24/2017   Procedure: ARTHROSCOPY KNEE, PARTIAL MEDIAL  MENISCECTOMY,;  Surgeon: Dorna Leitz, MD;  Location: [facility];  Service: Orthopedics;  Laterality: Right;  . PERINEAL HIDRADENITIS EXCISION      There were no vitals filed for this visit.  Subjective Assessment - 04/05/20 1409    Subjective  I don't wear my compression bra as often as I should when I'm home but I'm going to work on that bc I can tell my breast is a little more full.    Pertinent History  Patient was diagnosed on 06/11/2019 with left grade II invasive ductal carcinoma breast cancer. Patient underwent a left lumpectomy on 07/29/2019 with 1 negative lymph node removed. It is ER/PR positive and HER2 negative with a Ki67 of 15%. She had a right total knee replacement in 2018. She completed radiation in December and is anastrozole,  She is experiencing diffciutly with sleep. Her right shoulde is better after getting a cortisone shot She is wearing a compression bra some of the time, but it may not be tight enough    Patient Stated Goals  to be able to sleep on my stomach again    Currently in Pain?  No/denies                   Outpatient Re[facility]  Lymphedema Life Impact Scale Total Score  25 %           [facility] Adult PT Treatment/Exercise - 04/05/20 0001      Manual Therapy   Manual Therapy  Manual Lymphatic Drainage (MLD);Soft tissue mobilization    Soft tissue mobilization  Scar tissue mob and gentle fibrotic work to area of tenderness where palpable increased firmness, instructing pt in this throughout.     Manual Lymphatic Drainage (MLD)  MLD for the Lt breast: Short neck, 5 deep breathes, Rt axillary lymph nodes and Lt inguinal lymph nodes, anterior inter-axillary anastomosis, Lt axillo-inguinal anastomosis, superior breast toward inter-axillary anastomosis and inferior breast toward Lt axillo-inguinal anastomosis followed by re-working all surfaces, then into Rt S/L for further work at lateral breast and retracing pathways, then finished retracing pathways  again in supine                   PT Long Term Goals - 03/22/20 1032      PT LONG TERM GOAL #1   Title  Pt will report that the pain in her left breast is decreased by 50% and that she is able to sleep semi-prone with pillow support    Time  4    Period  Weeks    Status  New      PT LONG TERM GOAL #2   Title  Pt will decrease lymphedema life impact scale to 18    Baseline  25 on 03/22/2020    Time  4    Period  Weeks    Status  New      PT LONG TERM GOAL #3   Title  Pt will report she is independent in a home exercise for shoulder and trunk stretching to stimulate lymphatic flow    Time  4    Period  Weeks      PT LONG TERM GOAL #4   Title  Pt will report she knows what to do to manage breast lymphedema at home    Time  4    Period  Weeks    Status  New            Plan - 04/05/20 1713    Clinical Impression Statement  Continued with manual therapy focusing alot of manual lymph drainage and scar tissue and fibrotic work to medial and inferior breast area. Instructed pt while performing reduction of fibrotic techniques having her return demo. Also reviewed importance of being compliant of wearing her compression bra daily as she reports she has been doing this less as she doesn't lik wearing a bra when working from home, which is frequent. So reminded her of the importance of wearing her compression bra daily, at least for a few hours at a time, so as her lymphatic tissue will be better supported allowing for a reduction of the fluid. Pt verbalized understanding.    Personal Factors and Comorbidities  Comorbidity 3+    Comorbidities  previous surgery complicated with hematoma in breast,  radiation, DM .    Examination-Activity Limitations  Sleep;Reach Overhead    Stability/Clinical Decision Making  Stable/Uncomplicated    Rehab Potential  Good    PT Frequency  2x / week    PT Duration  4 weeks    PT Treatment/Interventions  ADLs/Self Care Home  Management;Therapeutic exercise;Patient/family education;Neuromuscular re-education;Manual techniques;Passive range of motion;Taping;Manual lymph drainage;Moist Heat;Cryotherapy;Joint Manipulations;Scar mobilization;Compression bandaging    PT Next Visit Plan  Continue manual lymph drainage and reviewing technqiue with pt; focus on congestion in left breast especially in medial and inframammary areas    PT Home Exercise Plan  Self MLD daily, 2x if able    Consulted and Agree with Plan of  Care  Patient       Patient will benefit from skilled therapeutic intervention in order to improve the following deficits and impairments:  Pain, Impaired UE functional use, Decreased strength, Postural dysfunction, Increased edema, Impaired perceived functional ability, Increased fascial restricitons  Visit Diagnosis: Disorder of the skin and subcutaneous tissue related to radiation, unspecified  Chronic right shoulder pain  Localized edema  Abnormal posture  Stiffness of right shoulder, not elsewhere classified  Muscle weakness (generalized)     Problem List Patient Active Problem List   Diagnosis Date Noted  . Cognitive dysfunction 03/26/2020  . Malignant neoplasm of upper-inner quadrant of left breast in female, estrogen receptor positive ([place]) 07/04/2019  . Acute medial meniscal tear, right, initial encounter 01/24/2017  . Chondromalacia patellae, right knee 01/24/2017  . Acute lateral meniscal tear, right, initial encounter 01/24/2017  . Hyperlipidemia LDL goal < 100 03/11/2014  . Dysuria 03/11/2014  . Tobacco abuse 03/11/2014  . Routine general medical examination at a health care facility 03/11/2014  . Hypercalcemia 03/11/2014  . Need for pneumococcal vaccination 03/11/2014  . Other and unspecified hyperlipidemia 03/14/2013  . Diabetes mellitus type 2 in obese [facility]) 03/14/2013    Otelia Limes, PTA 04/05/2020, [place], [place], [address] Phone: [phone]   Fax:  [phone]  Name: Kathryn Webb MRN: [id number] Date of Birth: [birth year]

## 2020-04-07 ENCOUNTER — Ambulatory Visit: Payer: BC Managed Care – PPO | Admitting: Physical Therapy

## 2020-04-12 ENCOUNTER — Other Ambulatory Visit: Payer: Self-pay

## 2020-04-12 ENCOUNTER — Encounter: Payer: Self-pay | Admitting: Physical Therapy

## 2020-04-12 ENCOUNTER — Ambulatory Visit: Payer: BC Managed Care – PPO | Admitting: Physical Therapy

## 2020-04-12 DIAGNOSIS — Z483 Aftercare following surgery for neoplasm: Secondary | ICD-10-CM

## 2020-04-12 DIAGNOSIS — L599 Disorder of the skin and subcutaneous tissue related to radiation, unspecified: Secondary | ICD-10-CM

## 2020-04-12 DIAGNOSIS — G8929 Other chronic pain: Secondary | ICD-10-CM

## 2020-04-12 DIAGNOSIS — R293 Abnormal posture: Secondary | ICD-10-CM

## 2020-04-12 DIAGNOSIS — R600 Localized edema: Secondary | ICD-10-CM

## 2020-04-12 NOTE — Therapy (Signed)
[place], [place], [address] Phone: [phone]   Fax:  [phone]  Physical Therapy Treatment  Patient Details  Name: Kathryn Webb MRN: [id number] Date of Birth: 08/23/1961 Referring Provider (PT): Dr. Sondra Come    Encounter Date: 04/12/2020  PT End of Session - 04/12/20 1222    Visit Number  5    Number of Visits  9    Date for PT Re-Evaluation  04/23/20    PT Start Time  0900    PT Stop Time  0945    PT Time Calculation (min)  45 min    Activity Tolerance  Patient tolerated treatment well    Behavior During Therapy  [facility] for tasks assessed/performed       Past Medical History:  Diagnosis Date  . Acute medial meniscus tear   . Arthritis   . Cancer ([place]) 06/2019   left breast IDC  . Diabetes mellitus without complication ([place])   . GERD (gastroesophageal reflux disease)   . Hidradenitis suppurativa   . Serum lipids high     Past Surgical History:  Procedure Laterality Date  . AXILLARY HIDRADENITIS EXCISION    . BREAST LUMPECTOMY WITH RADIOACTIVE SEED AND SENTINEL LYMPH NODE BIOPSY Left 07/29/2019   Procedure: LEFT BREAST LUMPECTOMY WITH RADIOACTIVE SEED AND LEFT AXILLARY SENTINEL LYMPH NODE BIOPSY;  Surgeon: Alphonsa Overall, MD;  Location: [facility];  Service: General;  Laterality: Left;  . CHONDROPLASTY Right 01/24/2017   Procedure: CHONDROPLASTY PATELLA  FEMORAL JOINT;  Surgeon: Dorna Leitz, MD;  Location: [facility];  Service: Orthopedics;  Laterality: Right;  . KNEE ARTHROSCOPY WITH LATERAL MENISECTOMY Right 01/24/2017   Procedure: KNEE ARTHROSCOPY WITH LATERAL MENISECTOMY/ PARTIAL;  Surgeon: Dorna Leitz, MD;  Location: [facility];  Service: Orthopedics;  Laterality: Right;  . KNEE ARTHROSCOPY WITH MEDIAL MENISECTOMY Right 01/24/2017   Procedure: ARTHROSCOPY KNEE, PARTIAL MEDIAL  MENISCECTOMY,;  Surgeon: Dorna Leitz, MD;  Location: [facility];  Service: Orthopedics;  Laterality: Right;  . PERINEAL HIDRADENITIS EXCISION      There were no vitals filed for this visit.  Subjective Assessment - 04/12/20 0908    Subjective  Pt states she can tell a difference when she wears her compression bra. When she is without it. she can feel the congestion    Pertinent History  Patient was diagnosed on 06/11/2019 with left grade II invasive ductal carcinoma breast cancer. Patient underwent a left lumpectomy on 07/29/2019 with 1 negative lymph node removed. It is ER/PR positive and HER2 negative with a Ki67 of 15%. She had a right total knee replacement in 2018. She completed radiation in December and is anastrozole,  She is experiencing diffciutly with sleep. Her right shoulde is better after getting a cortisone shot She is wearing a compression bra some of the time, but it may not be tight enough                   Outpatient Rehab from 03/22/2020 in [place]  Lymphedema Life Impact Scale Total Score  25 %           [facility] Adult PT Treatment/Exercise - 04/12/20 0001      Exercises   Exercises  Shoulder      Shoulder Exercises: Sidelying   ABduction  AROM;Left   stretch overhead    Other Sidelying Exercises  open book stretching, slowly, with deep breathing .  Pt  has less pain with elbow slightly flexed and wrist flexed with more pain in elbow and wrist extended.       Manual Therapy   Manual Therapy  Edema management;Manual Lymphatic Drainage (MLD)    Edema Management  upgraded to foam chip pouches for medial and inferior aspect of breast , showed pt samples of swell spots     Manual Lymphatic Drainage (MLD)  MLD for the Lt breast: Short neck, 5 deep breathes, Rt axillary lymph nodes and Lt inguinal lymph nodes, anterior inter-axillary anastomosis, Lt axillo-inguinal anastomosis, superior breast toward inter-axillary anastomosis and inferior breast toward Lt axillo-inguinal anastomosis  followed by re-working all surfaces, then into Rt S/L for further work at lateral breast and retracing pathways, then finished retracing pathways again in supine                   PT Long Term Goals - 03/22/20 1032      PT LONG TERM GOAL #1   Title  Pt will report that the pain in her left breast is decreased by 50% and that she is able to sleep semi-prone with pillow support    Time  4    Period  Weeks    Status  New      PT LONG TERM GOAL #2   Title  Pt will decrease lymphedema life impact scale to 18    Baseline  25 on 03/22/2020    Time  4    Period  Weeks    Status  New      PT LONG TERM GOAL #3   Title  Pt will report she is independent in a home exercise for shoulder and trunk stretching to stimulate lymphatic flow    Time  4    Period  Weeks      PT LONG TERM GOAL #4   Title  Pt will report she knows what to do to manage breast lymphedema at home    Time  4    Period  Weeks    Status  New            Plan - 04/12/20 1223    Clinical Impression Statement  Pt continues to have congestion in left breast with pain in medial and inferior aspect and pulling in superior aspect of breast with stretching.  She is benefitting from compression.  Upgraded to foam chip pack today for increased profile of compression    Personal Factors and Comorbidities  Comorbidity 3+    Comorbidities  previous surgery complicated with hematoma in breast,  radiation, DM .    Stability/Clinical Decision Making  Stable/Uncomplicated    Rehab Potential  Good    PT Frequency  2x / week    PT Duration  4 weeks    PT Next Visit Plan  Continue manual lymph drainage and reviewing technqiue with pt; focus on congestion in left breast especially in medial and inframammary areas Show pt swell spot info on [employer] or [email or web address]    PT Home Exercise Plan  Self MLD daily, 2x if able    Consulted and Agree with Plan of Care  Patient       Patient will benefit from skilled  therapeutic intervention in order to improve the following deficits and impairments:  Pain, Impaired UE functional use, Decreased strength, Postural dysfunction, Increased edema, Impaired perceived functional ability, Increased fascial restricitons  Visit Diagnosis: Disorder of the skin and subcutaneous tissue related to radiation, unspecified  Chronic  right shoulder pain  Localized edema  Abnormal posture  Aftercare following surgery for neoplasm     Problem List Patient Active Problem List   Diagnosis Date Noted  . Cognitive dysfunction 03/26/2020  . Malignant neoplasm of upper-inner quadrant of left breast in female, estrogen receptor positive ([place]) 07/04/2019  . Acute medial meniscal tear, right, initial encounter 01/24/2017  . Chondromalacia patellae, right knee 01/24/2017  . Acute lateral meniscal tear, right, initial encounter 01/24/2017  . Hyperlipidemia LDL goal < 100 03/11/2014  . Dysuria 03/11/2014  . Tobacco abuse 03/11/2014  . Routine general medical examination at a health care facility 03/11/2014  . Hypercalcemia 03/11/2014  . Need for pneumococcal vaccination 03/11/2014  . Other and unspecified hyperlipidemia 03/14/2013  . Diabetes mellitus type 2 in obese ([place]) 03/14/2013   Donato Heinz. Owens Shark PT  Norwood Levo 04/12/2020, 12:26 PM  [place], [place], [address] Phone: [phone]   Fax:  [phone]  Name: Kathryn Webb MRN: [id number] Date of Birth: 1961/05/27

## 2020-04-14 ENCOUNTER — Ambulatory Visit: Payer: BC Managed Care – PPO

## 2020-04-14 ENCOUNTER — Other Ambulatory Visit: Payer: Self-pay

## 2020-04-14 DIAGNOSIS — M25511 Pain in right shoulder: Secondary | ICD-10-CM | POA: Diagnosis not present

## 2020-04-14 DIAGNOSIS — R293 Abnormal posture: Secondary | ICD-10-CM | POA: Diagnosis not present

## 2020-04-14 DIAGNOSIS — M6281 Muscle weakness (generalized): Secondary | ICD-10-CM | POA: Diagnosis not present

## 2020-04-14 DIAGNOSIS — Z483 Aftercare following surgery for neoplasm: Secondary | ICD-10-CM | POA: Diagnosis not present

## 2020-04-14 DIAGNOSIS — R600 Localized edema: Secondary | ICD-10-CM

## 2020-04-14 DIAGNOSIS — L599 Disorder of the skin and subcutaneous tissue related to radiation, unspecified: Secondary | ICD-10-CM | POA: Diagnosis not present

## 2020-04-14 DIAGNOSIS — G8929 Other chronic pain: Secondary | ICD-10-CM | POA: Diagnosis not present

## 2020-04-14 DIAGNOSIS — M25611 Stiffness of right shoulder, not elsewhere classified: Secondary | ICD-10-CM | POA: Diagnosis not present

## 2020-04-14 NOTE — Therapy (Signed)
[place], [place], [address] Phone: [phone]   Fax:  [phone]  Physical Therapy Treatment  Patient Details  Name: Kathryn Webb MRN: [id number] Date of Birth: [birth year] Referring Provider (PT): Dr. Sondra Come    Encounter Date: 04/14/2020  PT End of Session - 04/14/20 1220    Visit Number  6    Number of Visits  9    Date for PT Re-Evaluation  04/23/20    PT Start Time  1106    PT Stop Time  1207    PT Time Calculation (min)  61 min    Activity Tolerance  Patient tolerated treatment well    Behavior During Therapy  [facility] for tasks assessed/performed       Past Medical History:  Diagnosis Date  . Acute medial meniscus tear   . Arthritis   . Cancer ([place]) 06/2019   left breast IDC  . Diabetes mellitus without complication ([place])   . GERD (gastroesophageal reflux disease)   . Hidradenitis suppurativa   . Serum lipids high     Past Surgical History:  Procedure Laterality Date  . AXILLARY HIDRADENITIS EXCISION    . BREAST LUMPECTOMY WITH RADIOACTIVE SEED AND SENTINEL LYMPH NODE BIOPSY Left 07/29/2019   Procedure: LEFT BREAST LUMPECTOMY WITH RADIOACTIVE SEED AND LEFT AXILLARY SENTINEL LYMPH NODE BIOPSY;  Surgeon: Alphonsa Overall, MD;  Location: [place];  Service: General;  Laterality: Left;  . CHONDROPLASTY Right 01/24/2017   Procedure: CHONDROPLASTY PATELLA  FEMORAL JOINT;  Surgeon: Dorna Leitz, MD;  Location: [place];  Service: Orthopedics;  Laterality: Right;  . KNEE ARTHROSCOPY WITH LATERAL MENISECTOMY Right 01/24/2017   Procedure: KNEE ARTHROSCOPY WITH LATERAL MENISECTOMY/ PARTIAL;  Surgeon: Dorna Leitz, MD;  Location: [place];  Service: Orthopedics;  Laterality: Right;  . KNEE ARTHROSCOPY WITH MEDIAL MENISECTOMY Right 01/24/2017   Procedure: ARTHROSCOPY KNEE, PARTIAL MEDIAL  MENISCECTOMY,;  Surgeon: Dorna Leitz, MD;  Location: [place];  Service: Orthopedics;  Laterality: Right;  . PERINEAL HIDRADENITIS EXCISION      There were no vitals filed for this visit.  Subjective Assessment - 04/14/20 1110    Subjective  I really like the foam she gave me last time.    Pertinent History  Patient was diagnosed on 06/11/2019 with left grade II invasive ductal carcinoma breast cancer. Patient underwent a left lumpectomy on 07/29/2019 with 1 negative lymph node removed. It is ER/PR positive and HER2 negative with a Ki67 of 15%. She had a right total knee replacement in 2018. She completed radiation in December and is anastrozole,  She is experiencing diffciutly with sleep. Her right shoulde is better after getting a cortisone shot She is wearing a compression bra some of the time, but it may not be tight enough    Patient Stated Goals  to be able to sleep on my stomach again    Currently in Pain?  No/denies                   Outpatient Re[facility]  Lymphedema Life Impact Scale Total Score  25 %           [facility] Adult PT Treatment/Exercise - 04/14/20 0001      Manual Therapy   Soft tissue mobilization  Gentle fibrotic work over fibrosis at medial inner breast that improved drastically today.    Manual Lymphatic Drainage (MLD)  MLD for the Lt breast: Short neck, 5 deep breathes, Rt axillary lymph nodes and Lt inguinal lymph nodes, anterior inter-axillary anastomosis, Lt axillo-inguinal anastomosis, superior breast toward inter-axillary anastomosis and inferior breast toward Lt axillo-inguinal anastomosis followed by re-working all surfaces. Had pt stay in supine today to focus on inner medial aspect of breast as very good softening noted today.                   PT Long Term Goals - 03/22/20 1032      PT LONG TERM GOAL #1   Title  Pt will report that the pain in her left breast is decreased by 50% and that she is able to sleep semi-prone with  pillow support    Time  4    Period  Weeks    Status  New      PT LONG TERM GOAL #2   Title  Pt will decrease lymphedema life impact scale to 18    Baseline  25 on 03/22/2020    Time  4    Period  Weeks    Status  New      PT LONG TERM GOAL #3   Title  Pt will report she is independent in a home exercise for shoulder and trunk stretching to stimulate lymphatic flow    Time  4    Period  Weeks      PT LONG TERM GOAL #4   Title  Pt will report she knows what to do to manage breast lymphedema at home    Time  4    Period  Weeks    Status  New            Plan - 04/14/20 1220    Clinical Impression Statement  Pt has been wearing new compression foam issued at last session and this has seemed to greatly reduce the fibrosis. Showed her swell spot options on [email or web address]. Focused at inner medial breast to further focus on reducing fibrosis and further good softening was noted by end of session. Continued to encourage pt to wear her compression bra as much as able during the day and she verbalized understanding.    Personal Factors and Comorbidities  Comorbidity 3+    Comorbidities  previous surgery complicated with hematoma in breast,  radiation, DM .    Examination-Activity Limitations  Sleep;Reach Overhead    Stability/Clinical Decision Making  Stable/Uncomplicated    Rehab Potential  Good    PT Frequency  2x / week    PT Duration  4 weeks    PT Treatment/Interventions  ADLs/Self Care Home Management;Therapeutic exercise;Patient/family education;Neuromuscular re-education;Manual techniques;Passive range of motion;Taping;Manual lymph drainage;Moist Heat;Cryotherapy;Joint Manipulations;Scar mobilization;Compression bandaging    PT Next Visit Plan  Continue manual lymph drainage and reviewing technqiue with pt; focus on congestion in left breast especially in medial and inframammary areas Show pt swell spot info on [email or web address] (did show pt compression guru but forgot to  mention [email or web address])    PT Home Exercise Plan  Self MLD daily, 2x if able    Consulted and Agree with Plan of Care  Patient       Patient will benefit from skilled therapeutic intervention in order to improve the following deficits and impairments:  Pain, Impaired UE functional use, Decreased strength, Postural dysfunction, Increased edema, Impaired perceived functional ability, Increased fascial restricitons  Visit Diagnosis: Disorder of the skin and subcutaneous tissue related to radiation, unspecified  Localized edema  Abnormal posture  Aftercare  following surgery for neoplasm  Muscle weakness (generalized)     Problem List Patient Active Problem List   Diagnosis Date Noted  . Cognitive dysfunction 03/26/2020  . Malignant neoplasm of upper-inner quadrant of left breast in female, estrogen receptor positive ([place]) 07/04/2019  . Acute medial meniscal tear, right, initial encounter 01/24/2017  . Chondromalacia patellae, right knee 01/24/2017  . Acute lateral meniscal tear, right, initial encounter 01/24/2017  . Hyperlipidemia LDL goal < 100 03/11/2014  . Dysuria 03/11/2014  . Tobacco abuse 03/11/2014  . Routine general medical examination at a health care facility 03/11/2014  . Hypercalcemia 03/11/2014  . Need for pneumococcal vaccination 03/11/2014  . Other and unspecified hyperlipidemia 03/14/2013  . Diabetes mellitus type 2 in obese [facility]) 03/14/2013    Otelia Limes, PTA 04/14/2020, 12:46 PM  [place], [place], [address] Phone: [phone]   Fax:  [phone]  Name: Kathryn Webb MRN: [id number] Date of Birth: 03-15-1961

## 2020-04-21 ENCOUNTER — Ambulatory Visit: Payer: BC Managed Care – PPO | Attending: Radiation Oncology

## 2020-04-21 ENCOUNTER — Other Ambulatory Visit: Payer: Self-pay

## 2020-04-21 DIAGNOSIS — R293 Abnormal posture: Secondary | ICD-10-CM

## 2020-04-21 DIAGNOSIS — M6281 Muscle weakness (generalized): Secondary | ICD-10-CM | POA: Diagnosis not present

## 2020-04-21 DIAGNOSIS — L599 Disorder of the skin and subcutaneous tissue related to radiation, unspecified: Secondary | ICD-10-CM

## 2020-04-21 DIAGNOSIS — R600 Localized edema: Secondary | ICD-10-CM | POA: Diagnosis not present

## 2020-04-21 DIAGNOSIS — Z483 Aftercare following surgery for neoplasm: Secondary | ICD-10-CM | POA: Insufficient documentation

## 2020-04-21 NOTE — Therapy (Signed)
[place], [place], [address] Phone: [phone]   Fax:  [phone]  Physical Therapy Treatment  Patient Details  Name: Kathryn Webb MRN: [id number] Date of Birth: Mar 25, 1961 Referring Provider (PT): Dr. Sondra Come    Encounter Date: 04/21/2020  PT End of Session - 04/21/20 1218    Visit Number  7    Number of Visits  9    Date for PT Re-Evaluation  04/23/20    PT Start Time  1106    PT Stop Time  1210    PT Time Calculation (min)  64 min    Activity Tolerance  Patient tolerated treatment well    Behavior During Therapy  [facility] for tasks assessed/performed       Past Medical History:  Diagnosis Date  . Acute medial meniscus tear   . Arthritis   . Cancer ([place]) 06/2019   left breast IDC  . Diabetes mellitus without complication ([place])   . GERD (gastroesophageal reflux disease)   . Hidradenitis suppurativa   . Serum lipids high     Past Surgical History:  Procedure Laterality Date  . AXILLARY HIDRADENITIS EXCISION    . BREAST LUMPECTOMY WITH RADIOACTIVE SEED AND SENTINEL LYMPH NODE BIOPSY Left 07/29/2019   Procedure: LEFT BREAST LUMPECTOMY WITH RADIOACTIVE SEED AND LEFT AXILLARY SENTINEL LYMPH NODE BIOPSY;  Surgeon: Alphonsa Overall, MD;  Location: [place];  Service: General;  Laterality: Left;  . CHONDROPLASTY Right 01/24/2017   Procedure: CHONDROPLASTY PATELLA  FEMORAL JOINT;  Surgeon: Dorna Leitz, MD;  Location: [place];  Service: Orthopedics;  Laterality: Right;  . KNEE ARTHROSCOPY WITH LATERAL MENISECTOMY Right 01/24/2017   Procedure: KNEE ARTHROSCOPY WITH LATERAL MENISECTOMY/ PARTIAL;  Surgeon: Dorna Leitz, MD;  Location: [place];  Service: Orthopedics;  Laterality: Right;  . KNEE ARTHROSCOPY WITH MEDIAL MENISECTOMY Right 01/24/2017   Procedure: ARTHROSCOPY KNEE, PARTIAL MEDIAL  MENISCECTOMY,;  Surgeon: Dorna Leitz, MD;  Location: [place];  Service: Orthopedics;  Laterality: Right;  . PERINEAL HIDRADENITIS EXCISION      There were no vitals filed for this visit.  Subjective Assessment - 04/21/20 1114    Subjective  I'm not going to get a swell spot for now as the newer foam has been really beneficial . Overall I can really tell an improvement with the swelling. I think we are getting somewhere.    Patient Stated Goals  to be able to sleep on my stomach again    Currently in Pain?  No/denies                   Outpatient Re[facility] from [facility]/22/2020 [facility]  Lymphedema Life Impact Scale Total Score  25 %           [facility] Adult PT Treatment/Exercise - 04/21/20 0001      Manual Therapy   Soft tissue mobilization  Gentle fibrotic work over fibrosis at medial inner breast that continues to show good improvement since start of care.     Manual Lymphatic Drainage (MLD)  MLD for the Lt breast: Short neck, 5 deep breathes, Rt axillary lymph nodes and Lt inguinal lymph nodes, anterior inter-axillary anastomosis, Lt axillo-inguinal anastomosis, superior breast toward inter-axillary anastomosis and inferior breast toward Lt axillo-inguinal anastomosis followed by re-working all surfaces, then into Rt S/L for further work to lateral breast redirecting towards Lt axillo-inguinal anastomosis, then finished retracing steps towards both anastomosis.  PT Long Term Goals - 03/22/20 1032      PT LONG TERM GOAL #1   Title  Pt will report that the pain in her left breast is decreased by 50% and that she is able to sleep semi-prone with pillow support    Time  4    Period  Weeks    Status  New      PT LONG TERM GOAL #2   Title  Pt will decrease lymphedema life impact scale to 18    Baseline  25 on 03/22/2020    Time  4    Period  Weeks    Status  New      PT LONG TERM GOAL #3   Title  Pt will report she is independent in a home exercise for shoulder and  trunk stretching to stimulate lymphatic flow    Time  4    Period  Weeks      PT LONG TERM GOAL #4   Title  Pt will report she knows what to do to manage breast lymphedema at home    Time  4    Period  Weeks    Status  New            Plan - 04/21/20 1218    Clinical Impression Statement  Pt continues with wear of compression bra with Medi lined foam. Her fibrosis has reduced very well, though is still present at inner medial breast. She reports noticing overall good softening of breast lymphedema as well. Pt may be ready for D/C at next session.    Personal Factors and Comorbidities  Comorbidity 3+    Comorbidities  previous surgery complicated with hematoma in breast,  radiation, DM .    Examination-Activity Limitations  Sleep;Reach Overhead    Stability/Clinical Decision Making  Stable/Uncomplicated    Rehab Potential  Good    PT Frequency  2x / week    PT Duration  4 weeks    PT Treatment/Interventions  ADLs/Self Care Home Management;Therapeutic exercise;Patient/family education;Neuromuscular re-education;Manual techniques;Passive range of motion;Taping;Manual lymph drainage;Moist Heat;Cryotherapy;Joint Manipulations;Scar mobilization;Compression bandaging    PT Next Visit Plan  Continue manual lymph drainage and reviewing technqiue with pt; focus on congestion in left breast especially in medial and inframammary areas. Possible D/C.    PT Home Exercise Plan  Self MLD daily, 2x if able    Consulted and Agree with Plan of Care  Patient       Patient will benefit from skilled therapeutic intervention in order to improve the following deficits and impairments:  Pain, Impaired UE functional use, Decreased strength, Postural dysfunction, Increased edema, Impaired perceived functional ability, Increased fascial restricitons  Visit Diagnosis: Disorder of the skin and subcutaneous tissue related to radiation, unspecified  Localized edema  Abnormal posture  Aftercare following  surgery for neoplasm  Muscle weakness (generalized)     Problem List Patient Active Problem List   Diagnosis Date Noted  . Cognitive dysfunction 03/26/2020  . Malignant neoplasm of upper-inner quadrant of left breast in female, estrogen receptor positive ([place]) 07/04/2019  . Acute medial meniscal tear, right, initial encounter 01/24/2017  . Chondromalacia patellae, right knee 01/24/2017  . Acute lateral meniscal tear, right, initial encounter 01/24/2017  . Hyperlipidemia LDL goal < 100 03/11/2014  . Dysuria 03/11/2014  . Tobacco abuse 03/11/2014  . Routine general medical examination at a health care facility 03/11/2014  . Hypercalcemia 03/11/2014  . Need for pneumococcal vaccination 03/11/2014  . Other and unspecified hyperlipidemia  03/14/2013  . Diabetes mellitus type 2 in obese [facility]) 03/14/2013    Otelia Limes, PTA 04/21/2020, 12:27 PM  [place], [place], [address] Phone: [phone]   Fax:  [phone]  Name: Kathryn Webb MRN: [id number] Date of Birth: 1961/03/10

## 2020-04-26 ENCOUNTER — Other Ambulatory Visit: Payer: Self-pay

## 2020-04-26 ENCOUNTER — Ambulatory Visit: Payer: BC Managed Care – PPO

## 2020-04-26 DIAGNOSIS — M6281 Muscle weakness (generalized): Secondary | ICD-10-CM

## 2020-04-26 DIAGNOSIS — R293 Abnormal posture: Secondary | ICD-10-CM | POA: Diagnosis not present

## 2020-04-26 DIAGNOSIS — R600 Localized edema: Secondary | ICD-10-CM | POA: Diagnosis not present

## 2020-04-26 DIAGNOSIS — Z483 Aftercare following surgery for neoplasm: Secondary | ICD-10-CM

## 2020-04-26 DIAGNOSIS — L599 Disorder of the skin and subcutaneous tissue related to radiation, unspecified: Secondary | ICD-10-CM | POA: Diagnosis not present

## 2020-04-26 NOTE — Therapy (Addendum)
[place], [place], [address] Phone: [phone]   Fax:  [phone]  Physical Therapy Treatment  Patient Details  Name: Kathryn Webb MRN: [id number] Date of Birth: [birth year] Referring Provider (PT): Dr. Sondra Come    Encounter Date: 04/26/2020  PT End of Session - 04/26/20 1013    Visit Number  8    Number of Visits  9    Date for PT Re-Evaluation  04/23/20    PT Start Time  5465    PT Stop Time  1005    PT Time Calculation (min)  68 min    Activity Tolerance  Patient tolerated treatment well    Behavior During Therapy  [facility] for tasks assessed/performed       Past Medical History:  Diagnosis Date  . Acute medial meniscus tear   . Arthritis   . Cancer ([place]) 06/2019   left breast IDC  . Diabetes mellitus without complication ([place])   . GERD (gastroesophageal reflux disease)   . Hidradenitis suppurativa   . Serum lipids high     Past Surgical History:  Procedure Laterality Date  . AXILLARY HIDRADENITIS EXCISION    . BREAST LUMPECTOMY WITH RADIOACTIVE SEED AND SENTINEL LYMPH NODE BIOPSY Left 07/29/2019   Procedure: LEFT BREAST LUMPECTOMY WITH RADIOACTIVE SEED AND LEFT AXILLARY SENTINEL LYMPH NODE BIOPSY;  Surgeon: Alphonsa Overall, MD;  Location: [place];  Service: General;  Laterality: Left;  . CHONDROPLASTY Right 01/24/2017   Procedure: CHONDROPLASTY PATELLA  FEMORAL JOINT;  Surgeon: Dorna Leitz, MD;  Location: [place];  Service: Orthopedics;  Laterality: Right;  . KNEE ARTHROSCOPY WITH LATERAL MENISECTOMY Right 01/24/2017   Procedure: KNEE ARTHROSCOPY WITH LATERAL MENISECTOMY/ PARTIAL;  Surgeon: Dorna Leitz, MD;  Location: [place];  Service: Orthopedics;  Laterality: Right;  . KNEE ARTHROSCOPY WITH MEDIAL MENISECTOMY Right 01/24/2017   Procedure: ARTHROSCOPY KNEE, PARTIAL MEDIAL  MENISCECTOMY,;  Surgeon: Dorna Leitz, MD;  Location: [place];  Service: Orthopedics;  Laterality: Right;  . PERINEAL HIDRADENITIS EXCISION      There were no vitals filed for this visit.  Subjective Assessment - 04/26/20 0904    Subjective  I've continued working with a little more pressure on the inner medial Lt breast like you showed me and I think that is continuing to help that area soften. I've had a boil pop up on my Rt lateral breast but I was able to get the fluid to release with a hot compress so I'm just keeping an eye on it. I get them often.    Pertinent History  Patient was diagnosed on 06/11/2019 with left grade II invasive ductal carcinoma breast cancer. Patient underwent a left lumpectomy on 07/29/2019 with 1 negative lymph node removed. It is ER/PR positive and HER2 negative with a Ki67 of 15%. She had a right total knee replacement in 2018. She completed radiation in December and is anastrozole,  She is experiencing diffciutly with sleep. Her right shoulde is better after getting a cortisone shot She is wearing a compression bra some of the time, but it may not be tight enough    Patient Stated Goals  to be able to sleep on my stomach again    Currently in Pain?  No/denies                   Outpatient Rehab from 04/26/2020 in [place]  Lymphedema Life Impact Scale  Total Score  10.29 %           [facility] Adult PT Treatment/Exercise - 04/26/20 0001      Manual Therapy   Manual Lymphatic Drainage (MLD)  MLD for the Lt breast: Short neck, 5 deep breathes, Rt axillary lymph nodes and Lt inguinal lymph nodes, anterior inter-axillary anastomosis (but decided against continuing using this pathway today due to active healing boil at Rt lateral breast that appears infected), Lt axillo-inguinal anastomosis, superior breast and inferior breast toward Lt axillo-inguinal anastomosis followed by re-working all surfaces, then into Rt S/L for further work to lateral breast redirecting towards Lt  axillo-inguinal anastomosis, then finished retracing steps in supine towards Lt axillo-inguinal anastomosis. Educated pt to hold off on using anterior inter-axilllary anastomosis until boil is less reddened and showing signs of healing.       K[employer]iotix   Edema  I band of tape with slits cut in it to lift skin to help with resorption of hematoma and allow for improve lymphatic flow; then instructed pt in how to place bucket and 3 fingers issuing piece for pt and instructing.                   PT Long Term Goals - 04/26/20 0911      PT LONG TERM GOAL #1   Title  Pt will report that the pain in her left breast is decreased by 50% and that she is able to sleep semi-prone with pillow support    Baseline  80% improvement at this time-04/26/20    Status  Achieved      PT LONG TERM GOAL #2   Title  Pt will decrease lymphedema life impact scale to 18    Baseline  25 on 03/22/2020; 10.29 on 04/26/20    Status  Achieved      PT LONG TERM GOAL #3   Title  Pt will report she is independent in a home exercise for shoulder and trunk stretching to stimulate lymphatic flow    Status  Achieved      PT LONG TERM GOAL #4   Title  Pt will report she knows what to do to manage breast lymphedema at home    Baseline  Pt is performing self MLD and wearing her compression bra daily and can verbalize how to manage symptoms now-04/27/19    Status  Achieved            Plan - 04/26/20 1039    Clinical Impression Statement  Pt has met all goals and is ready for D/C at this time. Instructed her in application of kinesiotape with "I" band , then issued cut piece of 3 fingers with bucket and instructed pt in application of this as well. Also sis not use anterior inter-axillary anastomosis today due to ative boil that is reddened and potentially infected so did not want to overload Rt axillary nodes. Educated pt to do same for  self MLD until signs of boil healling, if S/S however worsen to update doctor  immediately. Pt verbalized understanding but reports has dealt with boils like this before.    Personal Factors and Comorbidities  Comorbidity 3+    Comorbidities  previous surgery complicated with hematoma in breast,  radiation, DM .    Examination-Activity Limitations  Sleep;Reach Overhead    Stability/Clinical Decision Making  Stable/Uncomplicated    Rehab Potential  Good    PT Frequency  2x / week    PT Duration  4  weeks    PT Treatment/Interventions  ADLs/Self Care Home Management;Therapeutic exercise;Patient/family education;Neuromuscular re-education;Manual techniques;Passive range of motion;Taping;Manual lymph drainage;Moist Heat;Cryotherapy;Joint Manipulations;Scar mobilization;Compression bandaging    PT Next Visit Plan  D/C this visit    PT Home Exercise Plan  Self MLD daily, 2x if able; cont wear of compression bra with foam and kinesiotape prn    Consulted and Agree with Plan of Care  Patient       Patient will benefit from skilled therapeutic intervention in order to improve the following deficits and impairments:  Pain, Impaired UE functional use, Decreased strength, Postural dysfunction, Increased edema, Impaired perceived functional ability, Increased fascial restricitons  Visit Diagnosis: Disorder of the skin and subcutaneous tissue related to radiation, unspecified  Localized edema  Abnormal posture  Aftercare following surgery for neoplasm  Muscle weakness (generalized)     Problem List Patient Active Problem List   Diagnosis Date Noted  . Cognitive dysfunction 03/26/2020  . Malignant neoplasm of upper-inner quadrant of left breast in female, estrogen receptor positive ([place]) 07/04/2019  . Acute medial meniscal tear, right, initial encounter 01/24/2017  . Chondromalacia patellae, right knee 01/24/2017  . Acute lateral meniscal tear, right, initial encounter 01/24/2017  . Hyperlipidemia LDL goal < 100 03/11/2014  . Dysuria 03/11/2014  . Tobacco abuse  03/11/2014  . Routine general medical examination at a health care facility 03/11/2014  . Hypercalcemia 03/11/2014  . Need for pneumococcal vaccination 03/11/2014  . Other and unspecified hyperlipidemia 03/14/2013  . Diabetes mellitus type 2 in obese [facility]) 03/14/2013    Otelia Limes, PTA 04/26/2020, 11:05 PM  [place], [place], [address] Phone: [phone]   Fax:  [phone]  Name: Astria Jordahl MRN: [id number] Date of Birth: 1961-01-13

## 2020-04-28 ENCOUNTER — Ambulatory Visit: Payer: BC Managed Care – PPO

## 2020-06-02 ENCOUNTER — Telehealth: Payer: Self-pay | Admitting: *Deleted

## 2020-06-02 NOTE — Telephone Encounter (Signed)
RETURNED PATIENT'S PHONE CALL TO RESCHEDULE FU FOR 06-03-20, PATIENT AGREED TO COME ON 06-14-20 @ 10 AM

## 2020-06-03 ENCOUNTER — Ambulatory Visit
Admission: RE | Admit: 2020-06-03 | Discharge: 2020-06-03 | Disposition: A | Discharge status: Home / Self Care | Payer: BC Managed Care – PPO | Source: Ambulatory Visit | Attending: Radiation Oncology | Admitting: Radiation Oncology

## 2020-06-10 NOTE — Progress Notes (Signed)
Radiation Oncology         [phone] ________________________________  Name: Kathryn Webb MRN: [id number]  Date: 06/14/2020  DOB: 1961-02-07  Follow-Up Visit Note  CC: Lennie Odor, [place]  Nicholas Lose, MD    ICD-10-CM   1. Malignant neoplasm of upper-inner quadrant of left breast in female, estrogen receptor positive ([place])  C50.212    Z17.0     Diagnosis: Stage IA (T1c, N0) Left Breast UIQ, Invasive Ductal Carcinoma with DCIS, ER+ / PR+ / Her2-, Grade 2  Interval Since Last Radiation: Seven months, one week, and five days.  Radiation Treatment Dates: 09/16/2019 through 11/03/2019 Site Technique Total Dose (Gy) Dose per Fx (Gy) Completed Fx Beam Energies  Breast, Left: Breast_Lt 3D 50.4/50.4 1.8 28/28 6X, 10X  Breast, Left: Breast_Lt_Bst 3D 10/10 2 5/5 6X, 10X    Narrative:  The patient returns today for routine follow-up. Since her last visit, she was seen by Dr. Mickeal Skinner, neuro-oncologist, on 03/26/2020 for initial consultation and evaluation regarding cancer-associated cognitive decline. 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 She was provided with counseling regarding healthy behaviors to maintain cognitive function including exercise, diet, positive outlook, and mindful relaxation. No further CNS work-up or cognitive screening was recommended at that time.  Of note, she has been following-up with physical therapy for disorder of the skin and subcutaneous tissue related to radiation, chronic right shoulder pain, localized edema, abnormal posture, stiffness of the right shoulder, and generalized muscle weakness. She was last seen on 04/26/2020.  On review of systems, she reports intermittent pain under her left breast. She has had a "knot" in her left breast since surgery, for which she wears a compression bra and does exercises. She denies skin changes, lymphedema, and  difficulty with range of motion.  ALLERGIES:  has No Known Allergies.  Meds: Current Outpatient Medications  Medication Sig Dispense Refill   anastrozole (ARIMIDEX) 1 MG tablet Take 1 tablet (1 mg total) by mouth daily. 90 tablet 3   atorvastatin (LIPITOR) 20 MG tablet Take 20 mg by mouth daily.     Blood Glucose Monitoring Suppl (CONTOUR NEXT EZ MONITOR) W/DEVICE KIT 1 Act by Does not apply route 2 (two) times daily. 2 kit 0   glucose blood (BAYER CONTOUR NEXT TEST) test strip Use BID 100 each 12   losartan (COZAAR) 25 MG tablet Take 25 mg by mouth daily.     metFORMIN (GLUCOPHAGE) 1000 MG tablet Take 1,000 mg by mouth 2 (two) times daily.     Vitamin D, Ergocalciferol, (DRISDOL) 1.25 MG (50000 UNIT) CAPS capsule Take 50,000 Units by mouth once a week.     No current facility-administered medications for this encounter.    Physical Findings: The patient is in no acute distress. Patient is alert and oriented.  weight is 226 lb 9.6 oz (102.8 kg) (abnormal). Her temperature is 98.7 F (37.1 C). Her blood pressure is 144/73 (abnormal) and her pulse is 78. Her respiration is 20 and oxygen saturation is 100%. .  No significant changes. Lungs are clear to auscultation bilaterally. Heart has regular rate and rhythm. No palpable cervical, supraclavicular, or axillary adenopathy. Abdomen soft, non-tender, normal bowel sounds. Right breast: No palpable mass, nipple discharge, or bleeding. Left breast: Less edema noted compared to previous exam.  Patient continues to have some hyperpigmentation changes in the left breast but this is also improved.  She continues  to have induration near her lumpectomy scar but no dominant mass appreciated breast.  No nipple discharge or bleeding.  Lab Findings: Lab Results  Component Value Date   WBC 5.4 07/09/2019   HGB 12.6 07/09/2019   HCT 39.5 07/09/2019   MCV 84.4 07/09/2019   PLT 238 07/09/2019    Radiographic Findings: No results  found.  Impression: Stage IA (T1c, N0) Left Breast UIQ, Invasive Ductal Carcinoma with DCIS, ER+ / PR+ / Her2-, Grade 2  No evidence of recurrence on clinical exam today.  Breast edema and pain overall has improved with physical therapy  Plan:  The patient is scheduled to see Dr. Lindi Adie on 07/06/2020.  She will undergo mammography next month.  She continues on Arimidex.  In light of her close follow-up with medical oncology have not scheduled her for formal follow-up appointment but would be glad to see her at any time.  Total time spent in this encounter was 20 minutes which included reviewing the patient's most recent neuro-oncology consultation, physical therapy, physical examination, and documentation.  ____________________________________   Blair Promise, PhD, MD  This document serves as a record of services personally performed by Gery Pray, MD. It was created on his behalf by Clerance Lav, a trained medical scribe. The creation of this record is based on the scribe's personal observations and the provider's statements to them. This document has been checked and approved by the attending provider.

## 2020-06-14 ENCOUNTER — Ambulatory Visit
Admission: RE | Admit: 2020-06-14 | Discharge: 2020-06-14 | Disposition: A | Discharge status: Home / Self Care | Payer: BC Managed Care – PPO | Source: Ambulatory Visit | Attending: Radiation Oncology | Admitting: Radiation Oncology

## 2020-06-14 ENCOUNTER — Other Ambulatory Visit: Payer: Self-pay

## 2020-06-14 ENCOUNTER — Encounter: Payer: Self-pay | Admitting: Radiation Oncology

## 2020-06-14 DIAGNOSIS — Z7984 Long term (current) use of oral hypoglycemic drugs: Secondary | ICD-10-CM | POA: Insufficient documentation

## 2020-06-14 DIAGNOSIS — C50212 Malignant neoplasm of upper-inner quadrant of left female breast: Secondary | ICD-10-CM | POA: Diagnosis not present

## 2020-06-14 DIAGNOSIS — G3184 Mild cognitive impairment, so stated: Secondary | ICD-10-CM | POA: Diagnosis not present

## 2020-06-14 DIAGNOSIS — G8929 Other chronic pain: Secondary | ICD-10-CM | POA: Diagnosis not present

## 2020-06-14 DIAGNOSIS — Z79811 Long term (current) use of aromatase inhibitors: Secondary | ICD-10-CM | POA: Diagnosis not present

## 2020-06-14 DIAGNOSIS — M25511 Pain in right shoulder: Secondary | ICD-10-CM | POA: Diagnosis not present

## 2020-06-14 DIAGNOSIS — Z170 Estrogen receptor positive status [ER+]: Secondary | ICD-10-CM | POA: Insufficient documentation

## 2020-06-14 DIAGNOSIS — Z79899 Other long term (current) drug therapy: Secondary | ICD-10-CM | POA: Insufficient documentation

## 2020-06-14 DIAGNOSIS — Z08 Encounter for follow-up examination after completed treatment for malignant neoplasm: Secondary | ICD-10-CM | POA: Diagnosis not present

## 2020-06-14 NOTE — Progress Notes (Signed)
Patient here for a 3 month f/u visit.She reports intermittent pain under her left breast. Has a knot in her left breast since surgery and wears her compression bra and does her exercises. Denies skin problems, lymphedema, or problems with ROM.  BP (!) 144/73   Pulse 78   Temp 98.7 F (37.1 C)   Resp 20   Wt (!) 226 lb 9.6 oz (102.8 kg)   LMP 11/20/2012   SpO2 100%   BMI 31.60 kg/m    Wt Readings from Last 3 Encounters:  06/14/20 (!) 226 lb 9.6 oz (102.8 kg)  03/26/20 227 lb 12.8 oz (103.3 kg)  03/04/20 225 lb 6.4 oz (102.2 kg)

## 2020-07-06 ENCOUNTER — Inpatient Hospital Stay: Payer: BC Managed Care – PPO | Admitting: Hematology and Oncology

## 2020-07-12 NOTE — Progress Notes (Signed)
Patient Care Team: Lennie Odor, [place] as PCP - General (Nurse Practitioner) Rockwell Germany, RN as Oncology Nurse Navigator Mauro Kaufmann, RN as Oncology Nurse Navigator Alphonsa Overall, MD as Consulting Physician (General Surgery) Nicholas Lose, MD as Consulting Physician (Hematology and Oncology) Gery Pray, MD as Consulting Physician (Radiation Oncology)  DIAGNOSIS:    ICD-10-CM   1. Malignant neoplasm of upper-inner quadrant of left breast in female, estrogen receptor positive ([place])  C50.212    Z17.0     SUMMARY OF ONCOLOGIC HISTORY: Oncology History  Malignant neoplasm of upper-inner quadrant of left breast in female, estrogen receptor positive ([place])  07/04/2019 Initial Diagnosis   Routine screening detected an irregular, indeterminate architectural distortion in the left breast. US showed a 1.0cm mass at the 9 o'clock position. Biopsy showed IDC, grade 2, HER-2 - (1+), ER+ 100%, PR+ 100%, Ki67 15%.    07/09/2019 Cancer Staging   Staging form: Breast, AJCC 8th Edition - Clinical stage from 07/09/2019: Stage IA (cT1c, cN0, cM0, G2, ER+, PR+, HER2-)    07/29/2019 Surgery   Left lumpectomy [name]) [phone]): IDC with DCIS, grade 2, 1.5cm, clear margins, and one lymph node negative.    07/29/2019 Oncotype testing   Recurrence score: 6, distant recurrence at 9 years: 3%   09/17/2019 - 11/03/2019 Radiation Therapy   The patient initially received a dose of 50.4 Gy in 28 fractions to the breast using whole-breast tangent fields. This was delivered using a 3-D conformal technique. The pt received a boost delivering an additional 10 Gy in 5 fractions using a electron boost with 51mV electrons. The total dose was 60.4 Gy.    11/2019 - 11/2024 Anti-estrogen oral therapy   Anastrozole   01/18/2020 Cancer Staging   Staging form: Breast, AJCC 8th Edition - Pathologic: Stage IA (pT1c, pN0, cM0, G2, ER+, PR+, HER2-)       CHIEF COMPLIANT: Follow-up of left breast cancer on  anastrozole  INTERVAL HISTORY: Kathryn TJONIYAH MALLINGERis a 59y.o. with above-mentionedmentioned history of left breast cancer who underwent a lumpectomy, radiation, and is currently on antiestrogen therapy with anastrozole. Bone density scan on 02/17/20 showed normal bone density with a T-score of 0.0. She presents to the clinic today for follow-up.   Her major complaint with anastrozole is hot flashes that wake her up at night.  She also has some muscle aches and pains for which she she has been doing exercises and stretching.  Denies any lumps or nodules of the breast.  ALLERGIES:  has No Known Allergies.  MEDICATIONS:  Current Outpatient Medications  Medication Sig Dispense Refill  . anastrozole (ARIMIDEX) 1 MG tablet Take 1 tablet (1 mg total) by mouth daily. 90 tablet 3  . atorvastatin (LIPITOR) 20 MG tablet Take 20 mg by mouth daily.    . Blood Glucose Monitoring Suppl (CONTOUR NEXT EZ MONITOR) W/DEVICE KIT 1 Act by Does not apply route 2 (two) times daily. 2 kit 0  . glucose blood (BAYER CONTOUR NEXT TEST) test strip Use BID 100 each 12  . losartan (COZAAR) 25 MG tablet Take 25 mg by mouth daily.    . metFORMIN (GLUCOPHAGE) 1000 MG tablet Take 1,000 mg by mouth 2 (two) times daily.    . Vitamin D, Ergocalciferol, (DRISDOL) 1.25 MG (50000 UNIT) CAPS capsule Take 50,000 Units by mouth once a week.     No current facility-administered medications for this visit.    PHYSICAL EXAMINATION: ECOG PERFORMANCE STATUS: 1 - Symptomatic but completely  ambulatory  Vitals:   07/13/20 0817  BP: 132/63  Pulse: 62  Resp: 20  Temp: (!) 97.1 F (36.2 C)  SpO2: 100%   Filed Weights   07/13/20 0817  Weight: 226 lb 3.2 oz (102.6 kg)    BREAST: No palpable masses or nodules in either right or left breasts. No palpable axillary supraclavicular or infraclavicular adenopathy no breast tenderness or nipple discharge. (exam performed in the presence of a chaperone)  LABORATORY DATA:  I have reviewed the data as  listed CMP Latest Ref Rng & Units 07/09/2019 01/23/2017 03/11/2014  Glucose 70 - 99 mg/dL 114(H) 130(H) 110(H)  BUN 6 - 20 mg/dL _0 Creatinine 0.44 - 1.00 mg/dL 0.75 0.56 0.6  Sodium 135 - 145 mmol/L 139 137 139  Potassium 3.5 - 5.1 mmol/L 4.3 4.6 4.7  Chloride 98 - 111 mmol/L 103 104 103  CO2 22 - 32 mmol/L _1 Calcium 8.9 - 10.3 mg/dL 11.4(H) 11.8(H) 11.7(H)  Total Protein 6.5 - 8.1 g/dL 8.1 - 8.6(H)  Total Bilirubin 0.3 - 1.2 mg/dL 0.4 - 0.5  Alkaline Phos 38 - 126 U/L 118 - 112  AST 15 - 41 U/L 17 - 22  ALT 0 - 44 U/L 18 - 20    Lab Results  Component Value Date   WBC 5.4 07/09/2019   HGB 12.6 07/09/2019   HCT 39.5 07/09/2019   MCV 84.4 07/09/2019   PLT 238 07/09/2019   NEUTROABS 2.4 07/09/2019    ASSESSMENT & PLAN:  Malignant neoplasm of upper-inner quadrant of left breast in female, estrogen receptor positive ([place]) 07/04/2019:Routine screening detected an irregular, indeterminate architectural distortion in the left breast. US showed a 1.0cm mass at the 9 o'clock position. Biopsy showed IDC, grade 2, HER-2 - (1+), ER+ 100%, PR+ 100%, Ki67 15%. T1cN0 stage Ia clinical stage  07/29/2019:Left lumpectomy [name]): IDC with DCIS, grade 2, 1.5cm, clear margins, and one lymph node negative.ER 100%, PR 100%, Ki-67 15%, HER-2 negative T1CN0 stage Ia  Oncotype DX recurrence score: 6  Treatment plan: Anastrozole 1 mg daily starting November 21, 2019 Anastrozole toxicities: 1.  Hot flashes that wake her up at night: I discussed with her about switching the time of the day that she takes anastrozole versus taking a medication like gabapentin for the hot flashes.  She is not currently interested in taking any more medications. 2. muscle aches and pains: Managing with exercise. We discussed with her different options including switching her to a different aromatase inhibitor like letrozole. She will try to change the time of the day that she takes the anastrozole and then  call us back in a month if she wants to switch.  Cognitive dysfunction: Seen Dr. Mickeal Skinner  Breast cancer surveillance: 1.  Breast exam 07/13/2020: Benign 2. mammogram at [facility] August 2021: Benign  Return to clinic in 1 year for follow-up    No orders of the defined types were placed in this encounter.  The patient has a good understanding of the overall plan. she agrees with it. she will call with any problems that may develop before the next visit here.  Total time spent: 20 mins including face to face time and time spent for planning, charting and coordination of care  Nicholas Lose, MD 07/13/2020  I, Cloyde Reams Dorshimer, am acting as scribe for Dr. Nicholas Lose.  I have reviewed the above documentation for accuracy and completeness, and I agree with the above.

## 2020-07-13 ENCOUNTER — Other Ambulatory Visit: Payer: Self-pay

## 2020-07-13 ENCOUNTER — Inpatient Hospital Stay: Payer: BC Managed Care – PPO | Attending: Hematology and Oncology | Admitting: Hematology and Oncology

## 2020-07-13 DIAGNOSIS — Z923 Personal history of irradiation: Secondary | ICD-10-CM | POA: Insufficient documentation

## 2020-07-13 DIAGNOSIS — Z170 Estrogen receptor positive status [ER+]: Secondary | ICD-10-CM | POA: Diagnosis not present

## 2020-07-13 DIAGNOSIS — Z79899 Other long term (current) drug therapy: Secondary | ICD-10-CM | POA: Insufficient documentation

## 2020-07-13 DIAGNOSIS — C50212 Malignant neoplasm of upper-inner quadrant of left female breast: Secondary | ICD-10-CM | POA: Insufficient documentation

## 2020-07-13 DIAGNOSIS — R232 Flushing: Secondary | ICD-10-CM | POA: Diagnosis not present

## 2020-07-13 DIAGNOSIS — Z79811 Long term (current) use of aromatase inhibitors: Secondary | ICD-10-CM | POA: Diagnosis not present

## 2020-07-13 MED ORDER — ANASTROZOLE 1 MG PO TABS: 1.0000 mg | ORAL_TABLET | Freq: Every day | ORAL | 3 refills | Status: DC

## 2020-07-13 NOTE — Assessment & Plan Note (Signed)
07/04/2019:Routine screening detected an irregular, indeterminate architectural distortion in the left breast. US showed a 1.0cm mass at the 9 o'clock position. Biopsy showed IDC, grade 2, HER-2 - (1+), ER+ 100%, PR+ 100%, Ki67 15%. T1cN0 stage Ia clinical stage  07/29/2019:Left lumpectomy Kathryn Webb): IDC with DCIS, grade 2, 1.5cm, clear margins, and one lymph node negative.ER 100%, PR 100%, Ki-67 15%, HER-2 negative T1CN0 stage Ia  Oncotype DX recurrence score: 6  Treatment plan: Anastrozole 1 mg daily starting November 21, 2019 Anastrozole toxicities:  Cognitive dysfunction:  Breast cancer surveillance: 1.  Breast exam 07/13/2020: Benign 2. mammogram at [facility]  Return to clinic in 1 year for follow-up

## 2020-07-14 ENCOUNTER — Telehealth: Payer: Self-pay | Admitting: Hematology and Oncology

## 2020-07-14 NOTE — Telephone Encounter (Signed)
Scheduled appt per 8/24 los. Pt confirmed appt date and time.

## 2020-09-12 NOTE — Progress Notes (Signed)
Radiation Oncology         [phone] ________________________________  Name: Kathryn Webb MRN: [id number]  Date: 09/13/2020  DOB: Sep 14, 1961  Follow-Up Visit Note  CC: Lennie Odor, [place]  Nicholas Lose, MD    ICD-10-CM   1. Malignant neoplasm of upper-inner quadrant of left breast in female, estrogen receptor positive ([place])  C50.212    Z17.0     Diagnosis: Stage IA (T1c, N0) Left Breast UIQ, Invasive Ductal Carcinoma with DCIS, ER+ / PR+ / Her2-, Grade 2  Interval Since Last Radiation: Ten months, one week, and four days  Radiation Treatment Dates: 09/16/2019 through 11/03/2019 Site Technique Total Dose (Gy) Dose per Fx (Gy) Completed Fx Beam Energies  Breast, Left: Breast_Lt 3D 50.4/50.4 1.8 28/28 6X, 10X  Breast, Left: Breast_Lt_Bst 3D 10/10 2 5/5 6X, 10X    Narrative:  The patient returns today for routine follow-up. Since her last visit, she underwent a left breast ultrasound at [facility] on 07/06/2020 that did not show any mammogram evidence of malignancy.  She also underwent bilateral mammograms also showing no evidence of recurrence or concern along the right breast  She was last seen by Dr. Lindi Adie on 07/13/2020, during which time her breast exam was benign. She has been tolerating Anastrozole relatively well with the exception of hot flashes and myalgias.  On review of systems, she reports having a lump of scar tissue in her inner left breast. She denies breat pain, lymphedema, and difficulty with range of motion of her left upper extremity.  ALLERGIES:  has No Known Allergies.  Meds: Current Outpatient Medications  Medication Sig Dispense Refill  . anastrozole (ARIMIDEX) 1 MG tablet Take 1 tablet (1 mg total) by mouth daily. 90 tablet 3  . atorvastatin (LIPITOR) 20 MG tablet Take 20 mg by mouth daily.    Marland Kitchen atorvastatin (LIPITOR) 40 MG tablet Take 40 mg by mouth daily.    . Blood Glucose Monitoring Suppl (CONTOUR NEXT EZ MONITOR) W/DEVICE KIT 1 Act by Does not apply  route 2 (two) times daily. 2 kit 0  . glucose blood (BAYER CONTOUR NEXT TEST) test strip Use BID 100 each 12  . losartan (COZAAR) 25 MG tablet Take 25 mg by mouth daily.    . metFORMIN (GLUCOPHAGE) 1000 MG tablet Take 1,000 mg by mouth 2 (two) times daily.    . RYBELSUS 3 MG TABS Take 1 tablet by mouth at bedtime.    . Vitamin D, Ergocalciferol, (DRISDOL) 1.25 MG (50000 UNIT) CAPS capsule Take 50,000 Units by mouth once a week.     No current facility-administered medications for this encounter.    Physical Findings: The patient is in no acute distress. Patient is alert and oriented.  height is _0  (1.803 m) and weight is 226 lb 6.4 oz (102.7 kg). Her temperature is 98.5 F (36.9 C). Her blood pressure is 145/75 (abnormal) and her pulse is 65. Her respiration is 18 and oxygen saturation is 100%.   Lungs are clear to auscultation bilaterally. Heart has regular rate and rhythm. No palpable cervical, supraclavicular, or axillary adenopathy. Abdomen soft, non-tender, normal bowel sounds. Right breast: No palpable mass, nipple discharge, or bleeding. Left breast: Hyperpigmentation changes noted throughout the breast.  Patient continues to have some induration consistent with scar tissue medial to her lumpectomy  Scar. no nipple discharge or bleeding.  Lab Findings: Lab Results  Component Value Date   WBC 5.4 07/09/2019   HGB 12.6 07/09/2019   HCT 39.5 07/09/2019  MCV 84.4 07/09/2019   PLT 238 07/09/2019    Radiographic Findings: No results found.  Impression: Stage IA (T1c, N0) Left Breast UIQ, Invasive Ductal Carcinoma with DCIS, ER+ / PR+ / Her2-, Grade 2  No evidence of recurrence on clinical exam today.  The patient reports no ongoing discomfort or pain at this point, only sensitivity within the breast  Plan:  The patient is scheduled to see Dr. Lindi Adie on 07/18/2021. She will follow-up with radiation oncology in as-needed basis in light of her close follow-up with medical  oncology.   Total time spent in this encounter was 15 minutes which included reviewing the patient's most recent left breast ultrasound, follow-up with Dr. Lindi Adie, physical examination, and documentation.  ____________________________________   Blair Promise, PhD, MD  This document serves as a record of services personally performed by Gery Pray, MD. It was created on his behalf by Clerance Lav, a trained medical scribe. The creation of this record is based on the scribe's personal observations and the provider's statements to them. This document has been checked and approved by the attending provider.

## 2020-09-13 ENCOUNTER — Encounter: Payer: Self-pay | Admitting: Radiation Oncology

## 2020-09-13 ENCOUNTER — Ambulatory Visit
Admission: RE | Admit: 2020-09-13 | Discharge: 2020-09-13 | Disposition: A | Discharge status: Home / Self Care | Payer: BC Managed Care – PPO | Source: Ambulatory Visit | Attending: Radiation Oncology | Admitting: Radiation Oncology

## 2020-09-13 ENCOUNTER — Other Ambulatory Visit: Payer: Self-pay

## 2020-09-13 VITALS — BP 145/75 | HR 65 | Temp 98.5°F | Resp 18 | Ht 71.0 in | Wt 226.4 lb

## 2020-09-13 DIAGNOSIS — Z923 Personal history of irradiation: Secondary | ICD-10-CM | POA: Insufficient documentation

## 2020-09-13 DIAGNOSIS — Z170 Estrogen receptor positive status [ER+]: Secondary | ICD-10-CM | POA: Insufficient documentation

## 2020-09-13 DIAGNOSIS — Z79811 Long term (current) use of aromatase inhibitors: Secondary | ICD-10-CM | POA: Insufficient documentation

## 2020-09-13 DIAGNOSIS — R232 Flushing: Secondary | ICD-10-CM | POA: Diagnosis not present

## 2020-09-13 DIAGNOSIS — C50212 Malignant neoplasm of upper-inner quadrant of left female breast: Secondary | ICD-10-CM | POA: Diagnosis not present

## 2020-09-13 DIAGNOSIS — Z08 Encounter for follow-up examination after completed treatment for malignant neoplasm: Secondary | ICD-10-CM | POA: Diagnosis not present

## 2020-09-13 NOTE — Progress Notes (Signed)
Patient here for a f/u visit with Dr. Sondra Come. Last radiation tx 10 months ago. She denies pain and reports  Having a lump of scar tissue in her inner left breast. Denies problems with ''ROM or lymphedema. BP (!) 145/75 (BP Location: Left Arm, Patient Position: Sitting, Cuff Size: Normal)   Pulse 65   Temp 98.5 F (36.9 C)   Resp 18   Ht 5\' 11"  (1.803 m)   Wt 226 lb 6.4 oz (102.7 kg)   LMP 11/20/2012   SpO2 100%   BMI 31.58 kg/m   Wt Readings from Last 3 Encounters:  09/13/20 226 lb 6.4 oz (102.7 kg)  07/13/20 226 lb 3.2 oz (102.6 kg)  06/14/20 (!) 226 lb 9.6 oz (102.8 kg)

## 2021-06-14 ENCOUNTER — Encounter: Payer: Self-pay | Admitting: Adult Health

## 2021-07-18 ENCOUNTER — Ambulatory Visit: Payer: BC Managed Care – PPO | Admitting: Hematology and Oncology

## 2021-07-18 NOTE — Assessment & Plan Note (Deleted)
07/04/2019:Routine screening detected an irregular, indeterminate architectural distortion in the left breast. US showed a 1.0cm mass at the 9 o'clock position. Biopsy showed IDC, grade 2, HER-2 - (1+), ER+ 100%, PR+ 100%, Ki67 15%. T1cN0 stage Ia clinical stage  07/29/2019:Left lumpectomy Kathryn Webb): IDC with DCIS, grade 2, 1.5cm, clear margins, and one lymph node negative.ER 100%, PR 100%, Ki-67 15%, HER-2 negative T1CN0 stage Ia  Oncotype DX recurrence score: 6  Treatment plan: Anastrozole 1 mg daily starting November 21, 2019 Anastrozole toxicities: 1.  Hot flashes that wake her up at night: I discussed with her about switching the time of the day that she takes anastrozole versus taking a medication like gabapentin for the hot flashes.  She is not currently interested in taking any more medications. 2. muscle aches and pains: Managing with exercise. We discussed with her different options including switching her to a different aromatase inhibitor like letrozole. She will try to change the time of the day that she takes the anastrozole and then call us back in a month if she wants to switch.  Cognitive dysfunction: Seen Dr. Mickeal Skinner  Breast cancer surveillance: 1.  Breast exam 07/13/2020: Benign 2. mammogram at [facility] August 2021: Benign  Return to clinic in 1 year for follow-up

## 2021-08-15 ENCOUNTER — Other Ambulatory Visit: Payer: Self-pay | Admitting: Hematology and Oncology

## 2022-05-24 ENCOUNTER — Other Ambulatory Visit: Payer: Self-pay | Admitting: Hematology and Oncology

## 2022-05-26 ENCOUNTER — Telehealth: Payer: Self-pay

## 2022-05-26 ENCOUNTER — Other Ambulatory Visit: Payer: Self-pay | Admitting: Hematology and Oncology

## 2022-05-26 NOTE — Telephone Encounter (Signed)
Spoke with pt today regarding anastrozole rx. Pt needs f/u with our office as she has not been seen since 2021. Pt agreed to be seen by Wilber Bihari, NP 06/05/22 at [place] with labs at [facility]. Refill for anastrozole sent.

## 2022-06-01 ENCOUNTER — Other Ambulatory Visit: Payer: Self-pay | Admitting: *Deleted

## 2022-06-01 DIAGNOSIS — Z170 Estrogen receptor positive status [ER+]: Secondary | ICD-10-CM

## 2022-06-05 ENCOUNTER — Inpatient Hospital Stay: Payer: BC Managed Care – PPO

## 2022-06-05 ENCOUNTER — Inpatient Hospital Stay: Payer: BC Managed Care – PPO | Attending: Adult Health | Admitting: Adult Health

## 2022-06-05 ENCOUNTER — Other Ambulatory Visit: Payer: Self-pay

## 2022-06-05 ENCOUNTER — Telehealth: Payer: Self-pay | Admitting: Adult Health

## 2022-06-05 ENCOUNTER — Encounter: Payer: Self-pay | Admitting: Adult Health

## 2022-06-05 VITALS — BP 154/70 | HR 75 | Temp 97.8°F | Resp 16 | Ht 71.0 in | Wt 217.6 lb

## 2022-06-05 DIAGNOSIS — K219 Gastro-esophageal reflux disease without esophagitis: Secondary | ICD-10-CM | POA: Insufficient documentation

## 2022-06-05 DIAGNOSIS — C50212 Malignant neoplasm of upper-inner quadrant of left female breast: Secondary | ICD-10-CM

## 2022-06-05 DIAGNOSIS — Z170 Estrogen receptor positive status [ER+]: Secondary | ICD-10-CM

## 2022-06-05 DIAGNOSIS — Z79811 Long term (current) use of aromatase inhibitors: Secondary | ICD-10-CM | POA: Insufficient documentation

## 2022-06-05 DIAGNOSIS — Z923 Personal history of irradiation: Secondary | ICD-10-CM | POA: Diagnosis not present

## 2022-06-05 DIAGNOSIS — E119 Type 2 diabetes mellitus without complications: Secondary | ICD-10-CM | POA: Insufficient documentation

## 2022-06-05 DIAGNOSIS — Z809 Family history of malignant neoplasm, unspecified: Secondary | ICD-10-CM | POA: Insufficient documentation

## 2022-06-05 DIAGNOSIS — R4189 Other symptoms and signs involving cognitive functions and awareness: Secondary | ICD-10-CM | POA: Insufficient documentation

## 2022-06-05 DIAGNOSIS — E785 Hyperlipidemia, unspecified: Secondary | ICD-10-CM | POA: Diagnosis not present

## 2022-06-05 LAB — CBC WITH DIFFERENTIAL (CANCER CENTER ONLY)
Abs Immature Granulocytes: 0.01 10*3/uL (ref 0.00–0.07)
Basophils Absolute: 0.1 10*3/uL (ref 0.0–0.1)
Basophils Relative: 1 %
Eosinophils Absolute: 0.1 10*3/uL (ref 0.0–0.5)
Eosinophils Relative: 1 %
HCT: 36.4 % (ref 36.0–46.0)
Hemoglobin: 11.8 g/dL — ABNORMAL LOW (ref 12.0–15.0)
Immature Granulocytes: 0 %
Lymphocytes Relative: 36 %
Lymphs Abs: 2.4 10*3/uL (ref 0.7–4.0)
MCH: 27.3 pg (ref 26.0–34.0)
MCHC: 32.4 g/dL (ref 30.0–36.0)
MCV: 84.3 fL (ref 80.0–100.0)
Monocytes Absolute: 0.4 10*3/uL (ref 0.1–1.0)
Monocytes Relative: 6 %
Neutro Abs: 3.7 10*3/uL (ref 1.7–7.7)
Neutrophils Relative %: 56 %
Platelet Count: 223 10*3/uL (ref 150–400)
RBC: 4.32 MIL/uL (ref 3.87–5.11)
RDW: 15.3 % (ref 11.5–15.5)
WBC Count: 6.6 10*3/uL (ref 4.0–10.5)
nRBC: 0 % (ref 0.0–0.2)

## 2022-06-05 LAB — CMP (CANCER CENTER ONLY)
ALT: 19 U/L (ref 0–44)
AST: 18 U/L (ref 15–41)
Albumin: 4.5 g/dL (ref 3.5–5.0)
Alkaline Phosphatase: 121 U/L (ref 38–126)
Anion gap: 8 (ref 5–15)
BUN: 11 mg/dL (ref 6–20)
CO2: 25 mmol/L (ref 22–32)
Calcium: 11.7 mg/dL — ABNORMAL HIGH (ref 8.9–10.3)
Chloride: 105 mmol/L (ref 98–111)
Creatinine: 0.66 mg/dL (ref 0.44–1.00)
GFR, Estimated: >60 mL/min (ref >60)
Glucose, Bld: 160 mg/dL — ABNORMAL HIGH (ref 70–99)
Potassium: 4.4 mmol/L (ref 3.5–5.1)
Sodium: 138 mmol/L (ref 135–145)
Total Bilirubin: 0.3 mg/dL (ref 0.3–1.2)
Total Protein: 7.8 g/dL (ref 6.5–8.1)

## 2022-06-05 NOTE — Progress Notes (Signed)
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, estrogen receptor positive ([place]) Staging form: Breast, AJCC 8th Edition - Clinical stage from 07/09/2019: Stage IA (cT1c, cN0, cM0, G2, ER+, PR+, HER2-) - Signed by Nicholas Lose, MD on 07/09/2019 Stage prefix: Initial diagnosis Histologic grading system: 3 grade system Laterality: Left Staged by: Pathologist and managing physician Stage used in treatment planning: Yes National guidelines used in treatment planning: Yes Type of national guideline used in treatment planning: NCCN - Pathologic: Stage IA (pT1c, pN0, cM0, G2, ER+, PR+, HER2-) - Signed by Gardenia Phlegm, NP on 01/18/2020 Histologic grading system: 3 grade system   SUMMARY OF ONCOLOGIC HISTORY: Oncology History  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 US showed a 1.0cm mass at the 9 o'clock position. Biopsy showed IDC, grade 2, HER-2 - (1+), ER+ 100%, PR+ 100%, Ki67 15%.    07/09/2019 Cancer Staging   Staging form: Breast, AJCC 8th Edition - Clinical stage from 07/09/2019: Stage IA (cT1c, cN0, cM0, G2, ER+, PR+, HER2-)    07/29/2019 Surgery   Left lumpectomy [name]) [phone]): IDC with DCIS, grade 2, 1.5cm, clear margins, and one lymph node negative.    07/29/2019 Oncotype testing   Recurrence score: 6, distant recurrence at 9 years: 3%   09/17/2019 - 11/03/2019 Radiation Therapy   The patient initially received a dose of 50.4 Gy in 28 fractions to the breast using whole-breast tangent fields. This was delivered using a 3-D conformal technique. The pt received a boost delivering an additional 10 Gy in 5  fractions using a electron boost with 62mV electrons. The total dose was 60.4 Gy.    11/2019 - 11/2024 Anti-estrogen oral therapy   Anastrozole   01/18/2020 Cancer Staging   Staging form: Breast, AJCC 8th Edition - Pathologic: Stage IA (pT1c, pN0, cM0, G2, ER+, PR+, HER2-)       CURRENT THERAPY: Anastrozole  INTERVAL HISTORY: Kathryn Kathryn Webb returns for f/u of her history of breast cancer.  She has continued on Anastrozole with good tolerance other than hot flashes, experiencing approximately 3 per day.  WE have not seen her since 2021.  Her most recent mammogram was completed on 06/14/2021 and demonstrated no evidence of malignancy and breast density category B.    LCilicia Bordenhas a JOrpha Burdog that she walks three times a day.  She works at [employer] an [employer]  She is down 30 pounds since 2017.     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  MEDICAL HISTORY: Past Medical History:  Diagnosis Date   Acute medial meniscus tear    Arthritis    Cancer ([place]) 06/2019   left breast IDC   Diabetes mellitus without complication (HCC)    GERD (gastroesophageal reflux disease)    Hidradenitis suppurativa    Serum lipids high     SURGICAL  HISTORY: Past Surgical History:  Procedure Laterality Date   AXILLARY HIDRADENITIS EXCISION     BREAST LUMPECTOMY WITH RADIOACTIVE SEED AND SENTINEL LYMPH NODE BIOPSY Left 07/29/2019   Procedure: LEFT BREAST LUMPECTOMY WITH RADIOACTIVE SEED AND LEFT AXILLARY SENTINEL LYMPH NODE BIOPSY;  Surgeon: Alphonsa Overall, MD;  Location: [facility];  Service: General;  Laterality: Left;   CHONDROPLASTY Right 01/24/2017   Procedure: CHONDROPLASTY PATELLA  FEMORAL JOINT;  Surgeon: Dorna Leitz, MD;  Location: [facility];  Service: Orthopedics;  Laterality: Right;   KNEE ARTHROSCOPY WITH LATERAL MENISECTOMY Right 01/24/2017   Procedure: KNEE ARTHROSCOPY WITH LATERAL MENISECTOMY/ PARTIAL;  Surgeon: Dorna Leitz, MD;  Location: [facility];  Service: Orthopedics;  Laterality: Right;   KNEE ARTHROSCOPY WITH MEDIAL MENISECTOMY Right 01/24/2017   Procedure: ARTHROSCOPY KNEE, PARTIAL MEDIAL  MENISCECTOMY,;  Surgeon: Dorna Leitz, MD;  Location: [facility];  Service: Orthopedics;  Laterality: Right;   PERINEAL HIDRADENITIS EXCISION      SOCIAL HISTORY: Social History   Socioeconomic History   Marital status: Single    Spouse name: Not on file   Number of children: Not on file   Years of education: 12   Highest education level: Not on file  Occupational History   Occupation: Facilities manager: [employer]   Tobacco Use   Smoking status: Former    Packs/day: 0.25    Types: Cigarettes   Smokeless tobacco: Never   Tobacco comments:    quit 2 weeks ago and is taking chantix  Substance and Sexual Activity   Alcohol use: Yes    Comment: social   Drug use: No   Sexual activity: Not Currently    Birth control/protection: Post-menopausal  Other Topics Concern   Not on file  Social History Narrative   Regular exercise-yes   Caffiene Use-yes   Social Determinants of Health   Financial Resource Strain: Not on file  Food Insecurity:  Not on file  Transportation Needs: Not on file  Physical Activity: Not on file  Stress: Not on file  Social Connections: Not on file  Intimate Partner Violence: Not on file    FAMILY HISTORY: Family History  Problem Relation Age of Onset   Hypertension Father    Kidney disease Father    Diabetes Father    Heart disease Mother    Hypertension Mother    Diabetes Mother    Diabetes Brother    Cancer Paternal Uncle    Stroke Neg Hx     Review of Systems  Constitutional:  Negative for appetite change, chills, fatigue, fever and unexpected weight change.  HENT:   Negative for hearing loss, lump/mass and trouble swallowing.   Eyes:  Negative for eye problems and icterus.  Respiratory:  Negative for chest tightness, cough and shortness of breath.   Cardiovascular:  Negative for chest pain, leg swelling and palpitations.  Gastrointestinal:  Negative for abdominal distention, abdominal pain, constipation, diarrhea, nausea and vomiting.  Endocrine: Positive for hot flashes.  Genitourinary:  Negative for difficulty urinating.   Musculoskeletal:  Negative for arthralgias.  Skin:  Negative for itching and rash.  Neurological:  Negative for dizziness, extremity weakness, headaches and numbness.  Hematological:  Negative for adenopathy. Does not bruise/bleed easily.  Psychiatric/Behavioral:  Negative for depression. The patient is not nervous/anxious.       PHYSICAL EXAMINATION  ECOG PERFORMANCE STATUS: 1 - Symptomatic but completely ambulatory  Vitals:   06/05/22 0813  BP: (!) 154/70  Pulse: 75  Resp: 16  Temp: 97.8 F (36.6 C)  SpO2: 100%    Physical Exam Constitutional:      General: She is not in acute distress.    Appearance: Normal appearance. She is not toxic-appearing.  HENT:     Head: Normocephalic and atraumatic.  Eyes:     General: No scleral icterus. Cardiovascular:     Rate and Rhythm: Normal rate and regular rhythm.     Pulses: Normal pulses.     Heart  sounds: Normal heart sounds.  Pulmonary:     Effort: Pulmonary effort is normal.     Breath sounds: Normal breath sounds.  Chest:     Comments: Left breast s/p lumpectomy and radiation, no sign of local recurrence, right breast is benign Abdominal:     General: Abdomen is flat. Bowel sounds are normal. There is no distension.     Palpations: Abdomen is soft.     Tenderness: There is no abdominal tenderness.  Musculoskeletal:        General: No swelling.     Cervical back: Neck supple.  Lymphadenopathy:     Cervical: No cervical adenopathy.  Skin:    General: Skin is warm and dry.     Findings: No rash.  Neurological:     General: No focal deficit present.     Mental Status: She is alert.  Psychiatric:        Mood and Affect: Mood normal.        Behavior: Behavior normal.     LABORATORY DATA:  CBC    Component Value Date/Time   WBC 6.6 06/05/2022 0757   WBC 7.0 03/11/2014 0941   RBC 4.32 06/05/2022 0757   HGB 11.8 (L) 06/05/2022 0757   HCT 36.4 06/05/2022 0757   PLT 223 06/05/2022 0757   MCV 84.3 06/05/2022 0757   MCH 27.3 06/05/2022 0757   MCHC 32.4 06/05/2022 0757   RDW 15.3 06/05/2022 0757   LYMPHSABS 2.4 06/05/2022 0757   MONOABS 0.4 06/05/2022 0757   EOSABS 0.1 06/05/2022 0757   BASOSABS 0.1 06/05/2022 0757    CMP     Component Value Date/Time   NA 139 07/09/2019 0831   K 4.3 07/09/2019 0831   CL 103 07/09/2019 0831   CO2 26 07/09/2019 0831   GLUCOSE 114 (H) 07/09/2019 0831   BUN 13 07/09/2019 0831   CREATININE 0.75 07/09/2019 0831   CALCIUM 11.4 (H) 07/09/2019 0831   PROT 8.1 07/09/2019 0831   ALBUMIN 4.2 07/09/2019 0831   AST 17 07/09/2019 0831   ALT 18 07/09/2019 0831   ALKPHOS 118 07/09/2019 0831   BILITOT 0.4 07/09/2019 0831   GFRNONAA >60 07/09/2019 0831   GFRAA >60 07/09/2019 0831     ASSESSMENT and THERAPY PLAN:   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  She has no clinical or radiographic sign of breast cancer recurrence.  She will continue with annual mammograms at [facility] which is scheduled on June 20, 2022.  We discussed the recommendation for 3D mammograms annually moving forward and clinical breast exams with myself or Dr. Lindi Adie every 6 months.  She will continue on anastrozole daily she is tolerating it moderately well with the exception of hot flashes.  We discussed how caffeine and spicy foods can sometimes make things worse and she loves both of these.  We discussed the possibility of prescribing medication such as gabapentin or Effexor however she does not want to do that at this point.  Health maintenance/wellness promotion: I congratulated her on her 30 pound weight loss since 2017.  I recommended healthy diet/exercise, and continued f/u with her PCP for preventative care.    We will see her back in 6 months for f/u.      All questions were answered. The patient knows to call the clinic with any problems, questions or concerns. We can certainly see the patient much sooner if necessary.  Total encounter time:20 minutes*in face-to-face visit time, chart review, lab review, care coordination, order entry, and documentation of the encounter time.    Wilber Bihari, NP 06/05/22 8:55 AM Medical Oncology and Hematology [facility] [place], [place] [address] Tel. [phone]    Fax. [phone]  *Total Encounter Time as defined by the Centers for Medicare and Medicaid Services includes, in addition to the face-to-face time of a patient visit (documented in the note above) non-face-to-face time: obtaining and reviewing outside history, ordering and reviewing medications, tests or procedures, care coordination (communications  with other health care professionals or caregivers) and documentation in the medical record.

## 2022-06-05 NOTE — Assessment & Plan Note (Signed)
Kathryn Webb is a 61 year old woman with history of stage Ia estrogen positive breast cancer status postlumpectomy, adjuvant radiation, antiestrogen therapy with anastrozole beginning in January 2021.  She has no clinical or radiographic sign of breast cancer recurrence.  She will continue with annual mammograms at [facility] which is scheduled on June 20, 2022.  We discussed the recommendation for 3D mammograms annually moving forward and clinical breast exams with myself or Dr. Lindi Adie every 6 months.  She will continue on anastrozole daily she is tolerating it moderately well with the exception of hot flashes.  We discussed how caffeine and spicy foods can sometimes make things worse and she loves both of these.  We discussed the possibility of prescribing medication such as gabapentin or Effexor however she does not want to do that at this point.  Health maintenance/wellness promotion: I congratulated her on her 30 pound weight loss since 2017.  I recommended healthy diet/exercise, and continued f/u with her PCP for preventative care.    We will see her back in 6 months for f/u.

## 2022-06-05 NOTE — Telephone Encounter (Signed)
Scheduled appointment per 7/17 los. Patient is aware.

## 2022-06-06 ENCOUNTER — Encounter: Payer: Self-pay | Admitting: General Practice

## 2022-06-06 NOTE — Progress Notes (Signed)
[facility] Spiritual Care Note  Reached Ms Hadley by phone per referral from nursing for an additional layer of emotional/listening support. Ms Wehling took direct Spiritual Care number and plans to return call.   [place], [place], [facility] Pager [phone] Voicemail [phone]

## 2022-08-30 ENCOUNTER — Other Ambulatory Visit: Payer: Self-pay | Admitting: Hematology and Oncology

## 2022-11-21 ENCOUNTER — Other Ambulatory Visit: Payer: Self-pay | Admitting: Hematology and Oncology

## 2022-11-24 ENCOUNTER — Telehealth: Payer: Self-pay | Admitting: Adult Health

## 2022-11-24 NOTE — Telephone Encounter (Signed)
Rescheduled appointment per provider PAL. Patient is aware of the changes made to her upcoming appointment. 

## 2022-12-07 ENCOUNTER — Inpatient Hospital Stay: Payer: Self-pay | Admitting: Adult Health

## 2022-12-08 ENCOUNTER — Other Ambulatory Visit: Payer: Self-pay

## 2022-12-08 ENCOUNTER — Inpatient Hospital Stay: Payer: BC Managed Care – PPO | Attending: Adult Health | Admitting: Adult Health

## 2022-12-08 ENCOUNTER — Encounter: Payer: Self-pay | Admitting: Adult Health

## 2022-12-08 VITALS — BP 162/75 | HR 75 | Temp 98.0°F | Resp 16 | Ht 71.0 in | Wt 201.3 lb

## 2022-12-08 DIAGNOSIS — R232 Flushing: Secondary | ICD-10-CM | POA: Insufficient documentation

## 2022-12-08 DIAGNOSIS — E2839 Other primary ovarian failure: Secondary | ICD-10-CM | POA: Diagnosis not present

## 2022-12-08 DIAGNOSIS — C50212 Malignant neoplasm of upper-inner quadrant of left female breast: Secondary | ICD-10-CM | POA: Insufficient documentation

## 2022-12-08 DIAGNOSIS — Z79811 Long term (current) use of aromatase inhibitors: Secondary | ICD-10-CM | POA: Diagnosis not present

## 2022-12-08 DIAGNOSIS — E119 Type 2 diabetes mellitus without complications: Secondary | ICD-10-CM | POA: Diagnosis not present

## 2022-12-08 DIAGNOSIS — Z170 Estrogen receptor positive status [ER+]: Secondary | ICD-10-CM | POA: Insufficient documentation

## 2022-12-08 DIAGNOSIS — Z87891 Personal history of nicotine dependence: Secondary | ICD-10-CM | POA: Diagnosis not present

## 2022-12-08 MED ORDER — GABAPENTIN 100 MG PO CAPS: 100.0000 mg | ORAL_CAPSULE | Freq: Every day | ORAL | 0 refills | Status: DC

## 2022-12-08 NOTE — Progress Notes (Signed)
Faxed order for bone density scan to [facility], received receipt confirmation.

## 2022-12-08 NOTE — Assessment & Plan Note (Signed)
Kathryn Webb has no clinical or radiographic signs of breast cancer recurrence.  She will continue on antiestrogen therapy with anastrozole.  She will repeat her diagnostic mammogram and we will order bone density testing as well.  For her hot flashes these are her biggest issue.  First she is going to change the time of day that she takes the anastrozole.  She will take it in the morning instead of in the evening.  I also prescribed her gabapentin 100 to 300 mg for her to titrate up slowly nightly.  We discussed risks and benefits of the above in detail and she is in agreement with trying the gabapentin to see if it will help.  I reviewed healthy diet and exercise but first she does need to be getting a good amount of sleep every evening.  We will see her back in 6 months for labs and follow-up.

## 2022-12-08 NOTE — Progress Notes (Signed)
[facility] Follow up:    Webb, Kathryn, [address] [place] [address]   DIAGNOSIS:  Cancer Staging  Malignant neoplasm of upper-inner quadrant of left breast in female, estrogen receptor positive ([place]) Staging form: Breast, AJCC 8th Edition - Clinical stage from 07/09/2019: Stage IA (cT1c, cN0, cM0, G2, ER+, PR+, HER2-) - Signed by Nicholas Lose, MD on 07/09/2019 Stage prefix: Initial diagnosis Histologic grading system: 3 grade system Laterality: Left Staged by: Pathologist and managing physician Stage used in treatment planning: Yes National guidelines used in treatment planning: Yes Type of national guideline used in treatment planning: NCCN - Pathologic: Stage IA (pT1c, pN0, cM0, G2, ER+, PR+, HER2-) - Signed by Gardenia Phlegm, NP on 01/18/2020 Histologic grading system: 3 grade system   SUMMARY OF ONCOLOGIC HISTORY: Oncology History  Malignant neoplasm of upper-inner quadrant of left breast in female, estrogen receptor positive ([place])  07/04/2019 Initial Diagnosis   Routine screening detected an irregular, indeterminate architectural distortion in the left breast. US showed a 1.0cm mass at the 9 o'clock position. Biopsy showed IDC, grade 2, HER-2 - (1+), ER+ 100%, PR+ 100%, Ki67 15%.    07/09/2019 Cancer Staging   Staging form: Breast, AJCC 8th Edition - Clinical stage from 07/09/2019: Stage IA (cT1c, cN0, cM0, G2, ER+, PR+, HER2-)    07/29/2019 Surgery   Left lumpectomy [name]) [phone]): IDC with DCIS, grade 2, 1.5cm, clear margins, and one lymph node negative.    07/29/2019 Oncotype testing   Recurrence score: 6, distant recurrence at 9 years: 3%   09/17/2019 - 11/03/2019 Radiation Therapy   The patient initially received a dose of 50.4 Gy in 28 fractions to the breast using whole-breast tangent fields. This was delivered using a 3-D conformal technique. The pt received a boost delivering an additional 10 Gy in 5  fractions using a electron boost with 72mV electrons. The total dose was 60.4 Gy.    11/2019 - 11/2024 Anti-estrogen oral therapy   Anastrozole   01/18/2020 Cancer Staging   Staging form: Breast, AJCC 8th Edition - Pathologic: Stage IA (pT1c, pN0, cM0, G2, ER+, PR+, HER2-)       CURRENT THERAPY: Anastrozole  INTERVAL HISTORY: Kathryn Guise89y.o. female returns for follow-up of her history of breast cancer.  She continues on anastrozole daily and is struggling with hot flashes.  This is impacting her ability to sleep and she is waking up at about 3 AM and unable to go back to sleep.  Her most recent mammogram occurred on June 14, 2021 demonstrating no mammographic evidence of malignancy and breast density category B.  Her most recent bone density testing occurred on February 17, 2020 and was normal.  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  MEDICAL HISTORY: Past Medical History:  Diagnosis Date   Acute medial meniscus tear    Arthritis    Cancer ([place]) 06/2019   left breast IDC   Diabetes mellitus without complication (HCC)    GERD (gastroesophageal reflux disease)    Hidradenitis suppurativa    Serum lipids high     SURGICAL HISTORY: Past Surgical History:   Procedure Laterality Date   AXILLARY HIDRADENITIS EXCISION     BREAST LUMPECTOMY WITH RADIOACTIVE SEED AND SENTINEL LYMPH NODE BIOPSY Left 07/29/2019   Procedure: LEFT BREAST LUMPECTOMY WITH RADIOACTIVE SEED AND LEFT AXILLARY SENTINEL LYMPH NODE BIOPSY;  Surgeon: Alphonsa Overall, MD;  Location: [facility];  Service: General;  Laterality: Left;   CHONDROPLASTY Right 01/24/2017   Procedure: CHONDROPLASTY PATELLA  FEMORAL JOINT;  Surgeon: Dorna Leitz, MD;  Location: [facility];  Service: Orthopedics;  Laterality: Right;   KNEE ARTHROSCOPY WITH LATERAL MENISECTOMY Right 01/24/2017   Procedure: KNEE ARTHROSCOPY WITH LATERAL MENISECTOMY/ PARTIAL;  Surgeon: Dorna Leitz, MD;  Location: [facility];  Service: Orthopedics;  Laterality: Right;   KNEE ARTHROSCOPY WITH MEDIAL MENISECTOMY Right 01/24/2017   Procedure: ARTHROSCOPY KNEE, PARTIAL MEDIAL  MENISCECTOMY,;  Surgeon: Dorna Leitz, MD;  Location: [facility];  Service: Orthopedics;  Laterality: Right;   PERINEAL HIDRADENITIS EXCISION      SOCIAL HISTORY: Social History   Socioeconomic History   Marital status: Single    Spouse name: Not on file   Number of children: Not on file   Years of education: 12   Highest education level: Not on file  Occupational History   Occupation: Facilities manager: [employer]   Tobacco Use   Smoking status: Former    Packs/day: 0.25    Types: Cigarettes   Smokeless tobacco: Never   Tobacco comments:    quit 2 weeks ago and is taking chantix  Substance and Sexual Activity   Alcohol use: Yes    Comment: social   Drug use: No   Sexual activity: Not Currently    Birth control/protection: Post-menopausal  Other Topics Concern   Not on file  Social History Narrative   Regular exercise-yes   Caffiene Use-yes   Social Determinants of Health   Financial Resource Strain: Not on file  Food Insecurity: Not on file  Transportation  Needs: Not on file  Physical Activity: Not on file  Stress: Not on file  Social Connections: Not on file  Intimate Partner Violence: Not on file    FAMILY HISTORY: Family History  Problem Relation Age of Onset   Hypertension Father    Kidney disease Father    Diabetes Father    Heart disease Mother    Hypertension Mother    Diabetes Mother    Diabetes Brother    Cancer Paternal Uncle    Stroke Neg Hx     Review of Systems  Constitutional:  Negative for appetite change, chills, fatigue, fever and unexpected weight change.  HENT:   Negative for hearing loss, lump/mass and trouble swallowing.   Eyes:  Negative for eye problems and icterus.  Respiratory:  Negative for chest tightness, cough and shortness of breath.   Cardiovascular:  Negative for chest pain, leg swelling and palpitations.  Gastrointestinal:  Negative for abdominal distention, abdominal pain, constipation, diarrhea, nausea and vomiting.  Endocrine: Positive for hot flashes.  Genitourinary:  Negative for difficulty urinating.   Musculoskeletal:  Negative for arthralgias.  Skin:  Negative for itching and rash.  Neurological:  Negative for dizziness, extremity weakness, headaches and numbness.  Hematological:  Negative for adenopathy. Does not bruise/bleed easily.  Psychiatric/Behavioral:  Positive for sleep disturbance. Negative for depression. The patient is not nervous/anxious.       PHYSICAL EXAMINATION  ECOG PERFORMANCE STATUS: 1 - Symptomatic but completely ambulatory  Vitals:   12/08/22 1518  BP: (!) 162/75  Pulse: 75  Resp: 16  Temp: 98 F (36.7 C)  SpO2: 100%    Physical Exam Constitutional:      General: She is not in acute distress.    Appearance: Normal appearance. She is not toxic-appearing.  HENT:     Head: Normocephalic and atraumatic.  Eyes:     General: No scleral icterus. Cardiovascular:     Rate and Rhythm: Normal rate and regular rhythm.     Pulses: Normal pulses.     Heart  sounds: Normal heart sounds.  Pulmonary:     Effort: Pulmonary effort is normal.     Breath sounds: Normal breath sounds.  Chest:     Comments: Left breast status postlumpectomy and radiation no sign of local recurrence right breast is benign. Abdominal:     General: Abdomen is flat. Bowel sounds are normal. There is no distension.     Palpations: Abdomen is soft.     Tenderness: There is no abdominal tenderness.  Musculoskeletal:        General: No swelling.     Cervical back: Neck supple.  Lymphadenopathy:     Cervical: No cervical adenopathy.  Skin:    General: Skin is warm and dry.     Findings: No rash.  Neurological:     General: No focal deficit present.     Mental Status: She is alert.  Psychiatric:        Mood and Affect: Mood normal.        Behavior: Behavior normal.     LABORATORY DATA: None for this visit   ASSESSMENT and THERAPY PLAN:   Malignant neoplasm of upper-inner quadrant of left breast in female, estrogen receptor positive ([place]) Kathryn Webb has no clinical or radiographic signs of breast cancer recurrence.  She will continue on antiestrogen therapy with anastrozole.  She will repeat her diagnostic mammogram and we will order bone density testing as well.  For her hot flashes these are her biggest issue.  First she is going to change the time of day that she takes the anastrozole.  She will take it in the morning instead of in the evening.  I also prescribed her gabapentin 100 to 300 mg for her to titrate up slowly nightly.  We discussed risks and benefits of the above in detail and she is in agreement with trying the gabapentin to see if it will help.  I reviewed healthy diet and exercise but first she does need to be getting a good amount of sleep every evening.  We will see her back in 6 months for labs and follow-up.   All questions were answered. The patient knows to call the clinic with any problems, questions or concerns. We can certainly see the  patient much sooner if necessary.  Total encounter time:30 minutes*in face-to-face visit time, chart review, lab review, care coordination, order entry, and documentation of the encounter time.    Wilber Bihari, NP 12/08/22 4:29 PM Medical Oncology and Hematology [facility] [place], [place] [address] Tel. [phone]    Fax. [phone]  *Total Encounter Time as defined by the Centers  for Medicare and Medicaid Services includes, in addition to the face-to-face time of a patient visit (documented in the note above) non-face-to-face time: obtaining and reviewing outside history, ordering and reviewing medications, tests or procedures, care coordination (communications with other health care professionals or caregivers) and documentation in the medical record.

## 2022-12-11 ENCOUNTER — Encounter (HOSPITAL_COMMUNITY): Payer: Self-pay | Admitting: Hematology and Oncology

## 2023-01-02 ENCOUNTER — Encounter: Payer: Self-pay | Admitting: Hematology and Oncology

## 2023-02-16 ENCOUNTER — Other Ambulatory Visit: Payer: Self-pay | Admitting: Hematology and Oncology

## 2023-05-12 ENCOUNTER — Other Ambulatory Visit: Payer: Self-pay | Admitting: Hematology and Oncology

## 2023-05-23 ENCOUNTER — Telehealth: Payer: Self-pay | Admitting: Hematology and Oncology

## 2023-05-23 NOTE — Telephone Encounter (Signed)
Rescheduled appointment per provider. Patient is aware of the changes made to her upcoming appointment. 

## 2023-06-07 ENCOUNTER — Inpatient Hospital Stay: Payer: BC Managed Care – PPO | Admitting: Hematology and Oncology

## 2023-06-17 NOTE — Progress Notes (Signed)
HEMATOLOGY-ONCOLOGY TELEPHONE VISIT PROGRESS NOTE  I connected with our patient on 06/22/23 at  9:45 AM EDT by telephone and verified that I am speaking with the correct person using two identifiers.  I discussed the limitations, risks, security and privacy concerns of performing an evaluation and management service by telephone and the availability of in person appointments.  I also discussed with the patient that there may be a patient responsible charge related to this service. The patient expressed understanding and agreed to proceed.   History of Present Illness: Kathryn Webb is a 62 y.o. with above-mentioned history of left breast cancer who underwent a lumpectomy, radiation, and is currently on antiestrogen therapy with anastrozole. She presents to the clinic for a telephone follow-up.  She reports that she continues to have difficulty with hot flashes and difficulty with sleeping.  She did not take the gabapentin that was prescribed by Kathryn Webb.  Oncology History  Malignant neoplasm of upper-inner quadrant of left breast in female, estrogen receptor positive (H[facility])  07/04/2019 Initial Diagnosis   Routine screening detected an irregular, indeterminate architectural distortion in the left breast. US showed a 1.0cm mass at the 9 o'clock position. Biopsy showed IDC, grade 2, HER-2 - (1+), ER+ 100%, PR+ 100%, Ki67 15%.    07/09/2019 Cancer Staging   Staging form: Breast, AJCC 8th Edition - Clinical stage from 07/09/2019: Stage IA (cT1c, cN0, cM0, G2, ER+, PR+, HER2-)    07/29/2019 Surgery   Left lumpectomy Ezzard Standing) [phone]): IDC with DCIS, grade 2, 1.5cm, clear margins, and one lymph node negative.    07/29/2019 Oncotype testing   Recurrence score: 6, distant recurrence at 9 years: 3%   09/17/2019 - 11/03/2019 Radiation Therapy   The patient initially received a dose of 50.4 Gy in 28 fractions to the breast using whole-breast tangent fields. This was delivered using a 3-D conformal  technique. The pt received a boost delivering an additional 10 Gy in 5 fractions using a electron boost with electrons. The total dose was 60.4 Gy.    11/2019 - 11/2024 Anti-estrogen oral therapy   Anastrozole   01/18/2020 Cancer Staging   Staging form: Breast, AJCC 8th Edition - Pathologic: Stage IA (pT1c, pN0, cM0, G2, ER+, PR+, HER2-)       REVIEW OF SYSTEMS:   Constitutional: Denies fevers, chills or abnormal weight loss All other systems were reviewed with the patient and are negative. Observations/Objective:     Assessment Plan:  Malignant neoplasm of upper-inner quadrant of left breast in female, estrogen receptor positive (HCC) 07/04/2019:Routine screening detected an irregular, indeterminate architectural distortion in the left breast. US showed a 1.0cm mass at the 9 o'clock position. Biopsy showed IDC, grade 2, HER-2 - (1+), ER+ 100%, PR+ 100%, Ki67 15%.  T1cN0 stage Ia clinical stage   07/29/2019:Left lumpectomy Ezzard Standing): IDC with DCIS, grade 2, 1.5cm, clear margins, and one lymph node negative.  ER 100%, PR 100%, Ki-67 15%, HER-2 negative T1CN0 stage Ia   Oncotype DX recurrence score: 6   Treatment plan: Anastrozole 1 mg daily starting November 21, 2019  Anastrozole toxicities: 1. Hot flashes that wake her up at night: She did not take the gabapentin that was prescribed by Kathryn Webb.  She is not willing to take it.  So sent a new prescription today.  She will take 100 mg at bedtime.  I told her that she can increase the dosage if it is not working to up to 300 mg at bedtime. 2. Muscle aches and  pains: Managing with exercise.    Cognitive dysfunction: Seen Dr. Barbaraann Webb   Breast cancer surveillance: 1.  Breast exam 06/22/2023: Benign 2. mammogram at [facility] 06/21/2022: Benign, breast density category B   Return to clinic in 6 months to follow-up with Kathryn Webb      I discussed the assessment and treatment plan with the patient. The patient was provided an opportunity to ask  questions and all were answered. The patient agreed with the plan and demonstrated an understanding of the instructions. The patient was advised to call back or seek an in-person evaluation if the symptoms worsen or if the condition fails to improve as anticipated.   I provided 12 minutes of non-face-to-face time during this encounter.  This includes time for charting and coordination of care   Tamsen Meek, MD  I Janan Ridge am acting as a scribe for Dr.   I have reviewed the above documentation for accuracy and completeness, and I agree with the above.

## 2023-06-19 ENCOUNTER — Inpatient Hospital Stay: Payer: BC Managed Care – PPO | Admitting: Hematology and Oncology

## 2023-06-22 ENCOUNTER — Inpatient Hospital Stay: Payer: BC Managed Care – PPO | Attending: Hematology and Oncology | Admitting: Hematology and Oncology

## 2023-06-22 DIAGNOSIS — C50212 Malignant neoplasm of upper-inner quadrant of left female breast: Secondary | ICD-10-CM | POA: Diagnosis not present

## 2023-06-22 DIAGNOSIS — Z170 Estrogen receptor positive status [ER+]: Secondary | ICD-10-CM

## 2023-06-22 DIAGNOSIS — R232 Flushing: Secondary | ICD-10-CM | POA: Diagnosis not present

## 2023-06-22 MED ORDER — GABAPENTIN 100 MG PO CAPS
100.0000 mg | ORAL_CAPSULE | Freq: Every day | ORAL | 3 refills | Status: DC
Start: 2023-06-22 — End: 2024-06-23

## 2023-06-22 NOTE — Assessment & Plan Note (Signed)
07/04/2019:Routine screening detected an irregular, indeterminate architectural distortion in the left breast. US showed a 1.0cm mass at the 9 o'clock position. Biopsy showed IDC, grade 2, HER-2 - (1+), ER+ 100%, PR+ 100%, Ki67 15%.  T1cN0 stage Ia clinical stage   07/29/2019:Left lumpectomy Ezzard Standing): IDC with DCIS, grade 2, 1.5cm, clear margins, and one lymph node negative.  ER 100%, PR 100%, Ki-67 15%, HER-2 negative T1CN0 stage Ia   Oncotype DX recurrence score: 6   Treatment plan: Anastrozole 1 mg daily starting November 21, 2019 Anastrozole toxicities: 1.  Hot flashes that wake her up at night:   2. muscle aches and pains: Managing with exercise.    Cognitive dysfunction: Seen Dr. Barbaraann Cao   Breast cancer surveillance: 1.  Breast exam 06/22/2023: Benign 2. mammogram at [facility] 06/21/2022: Benign, breast density category B   Return to clinic in 1 year for follow-up

## 2023-07-06 ENCOUNTER — Other Ambulatory Visit: Payer: Self-pay

## 2023-09-10 ENCOUNTER — Other Ambulatory Visit: Payer: Self-pay | Admitting: Hematology and Oncology

## 2023-09-10 NOTE — Telephone Encounter (Signed)
Per last OV, continue anastrazole until 11/2024. Lorayne Marek, RN

## 2023-12-08 ENCOUNTER — Other Ambulatory Visit: Payer: Self-pay | Admitting: Hematology and Oncology

## 2024-03-18 ENCOUNTER — Other Ambulatory Visit: Payer: Self-pay | Admitting: Hematology and Oncology

## 2024-06-18 ENCOUNTER — Telehealth: Payer: Self-pay | Admitting: Hematology and Oncology

## 2024-06-18 ENCOUNTER — Other Ambulatory Visit: Payer: Self-pay | Admitting: Hematology and Oncology

## 2024-06-18 NOTE — Telephone Encounter (Signed)
 Spoke with patient confirming upcoming appointment

## 2024-06-23 ENCOUNTER — Inpatient Hospital Stay: Attending: Adult Health | Admitting: Adult Health

## 2024-06-23 ENCOUNTER — Encounter: Payer: Self-pay | Admitting: Adult Health

## 2024-06-23 VITALS — BP 162/76 | HR 63 | Temp 98.5°F | Resp 18 | Ht 71.0 in | Wt 195.4 lb

## 2024-06-23 DIAGNOSIS — C50212 Malignant neoplasm of upper-inner quadrant of left female breast: Secondary | ICD-10-CM | POA: Insufficient documentation

## 2024-06-23 DIAGNOSIS — Z170 Estrogen receptor positive status [ER+]: Secondary | ICD-10-CM | POA: Insufficient documentation

## 2024-06-23 DIAGNOSIS — Z923 Personal history of irradiation: Secondary | ICD-10-CM | POA: Insufficient documentation

## 2024-06-23 DIAGNOSIS — R232 Flushing: Secondary | ICD-10-CM

## 2024-06-23 DIAGNOSIS — Z79811 Long term (current) use of aromatase inhibitors: Secondary | ICD-10-CM | POA: Insufficient documentation

## 2024-06-23 DIAGNOSIS — Z87891 Personal history of nicotine dependence: Secondary | ICD-10-CM | POA: Diagnosis not present

## 2024-06-23 MED ORDER — GABAPENTIN 300 MG PO CAPS
300.0000 mg | ORAL_CAPSULE | Freq: Every day | ORAL | 1 refills | Status: AC
Start: 2024-06-23 — End: ?

## 2024-06-23 NOTE — Assessment & Plan Note (Signed)
 07/04/2019:Routine screening detected an irregular, indeterminate architectural distortion in the left breast. US  showed a 1.0cm mass at the 9 o'clock position. Biopsy showed IDC, grade 2, HER-2 - (1+), ER+ 100%, PR+ 100%, Ki67 15%.  T1cN0 stage Ia clinical stage   07/29/2019:Left lumpectomy [facility]): IDC with DCIS, grade 2, 1.5cm, clear margins, and one lymph node negative.  ER 100%, PR 100%, Ki-67 15%, HER-2 negative T1CN0 stage Ia   Oncotype DX recurrence score: 6   Treatment plan: Anastrozole  1 mg daily starting November 21, 2019 Anastrozole  toxicities: 1.  Hot flashes that wake her up at night:

## 2024-06-23 NOTE — Progress Notes (Signed)
 [facility] Cancer Follow up:    Webb, [place], [place] [address] [place] [address]   DIAGNOSIS:  Cancer Staging  Malignant neoplasm of upper-inner quadrant of left breast in female, estrogen receptor positive (HCC) Staging form: Breast, AJCC 8th Edition - Clinical stage from 07/09/2019: Stage IA (cT1c, cN0, cM0, G2, ER+, PR+, HER2-) - Signed by Odean Potts, MD on 07/09/2019 Stage prefix: Initial diagnosis Histologic grading system: 3 grade system Laterality: Left Staged by: Pathologist and managing physician Stage used in treatment planning: Yes National guidelines used in treatment planning: Yes Type of national guideline used in treatment planning: NCCN - Pathologic: Stage IA (pT1c, pN0, cM0, G2, ER+, PR+, HER2-) - Signed by Crawford Morna Pickle, NP on 01/18/2020 Histologic grading system: 3 grade system    SUMMARY OF ONCOLOGIC HISTORY: Oncology History  Malignant neoplasm of upper-inner quadrant of left breast in female, estrogen receptor positive (HCC)  07/04/2019 Initial Diagnosis   Routine screening detected an irregular, indeterminate architectural distortion in the left breast. US  showed a 1.0cm mass at the 9 o'clock position. Biopsy showed IDC, grade 2, HER-2 - (1+), ER+ 100%, PR+ 100%, Ki67 15%.    07/09/2019 Cancer Staging   Staging form: Breast, AJCC 8th Edition - Clinical stage from 07/09/2019: Stage IA (cT1c, cN0, cM0, G2, ER+, PR+, HER2-)    07/29/2019 Surgery   Left lumpectomy Jeoffrey) [phone]): IDC with DCIS, grade 2, 1.5cm, clear margins, and one lymph node negative.    07/29/2019 Oncotype testing   Recurrence score: 6, distant recurrence at 9 years: 3%   09/17/2019 - 11/03/2019 Radiation Therapy   The patient initially received a dose of 50.4 Gy in 28 fractions to the breast using whole-breast tangent fields. This was delivered using a 3-D conformal technique. The pt received a boost delivering an additional 10 Gy in 5  fractions using a electron boost with electrons. The total dose was 60.4 Gy.    11/2019 - 11/2024 Anti-estrogen oral therapy   Anastrozole    01/18/2020 Cancer Staging   Staging form: Breast, AJCC 8th Edition - Pathologic: Stage IA (pT1c, pN0, cM0, G2, ER+, PR+, HER2-)       CURRENT THERAPY:  INTERVAL HISTORY:  Discussed the use of AI scribe software for clinical note transcription with the patient, who gave verbal consent to proceed.  History of Present Illness Kathryn Webb is a 63 year old female with a history of left sided breast cancer who presents for follow-up and discussion around gabapentin .  She was diagnosed with stage 1A, ERPR positive left sided breast cancer in August 2020. She underwent a lumpectomy followed by adjuvant radiation and has been on anastrozole  since January 2021. She is experiencing significant hot flashes.  She uses gabapentin  to manage hot flashes, taking three tablets to sleep through the night, but this causes morning drowsiness. To avoid drowsiness during the workweek, she only takes gabapentin  on Friday nights. She has not tried Effexor for hot flashes.  Her most recent mammogram was last August at [facility], but records are not accessible. Her bone density was normal as of January 01, 2023.   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  MEDICAL HISTORY: Past Medical History:  Diagnosis Date   Acute medial meniscus tear    Arthritis    Cancer (HCC) 06/2019   left breast IDC   Diabetes mellitus without complication (HCC)    GERD (gastroesophageal reflux disease)    Hidradenitis suppurativa    Serum lipids high     SURGICAL HISTORY: Past Surgical History:  Procedure Laterality Date   AXILLARY HIDRADENITIS EXCISION     BREAST LUMPECTOMY WITH RADIOACTIVE SEED AND SENTINEL LYMPH NODE BIOPSY Left 07/29/2019   Procedure: LEFT BREAST LUMPECTOMY WITH RADIOACTIVE SEED AND LEFT AXILLARY SENTINEL LYMPH NODE BIOPSY;  Surgeon: Ethyl Lenis, MD;  Location: [facility];  Service: General;  Laterality: Left;   CHONDROPLASTY Right 01/24/2017   Procedure: CHONDROPLASTY PATELLA  FEMORAL JOINT;  Surgeon: Norleen Gavel, MD;  Location: [facility];  Service: Orthopedics;  Laterality: Right;   KNEE ARTHROSCOPY WITH LATERAL MENISECTOMY Right 01/24/2017   Procedure: KNEE ARTHROSCOPY WITH LATERAL MENISECTOMY/ PARTIAL;  Surgeon: Norleen Gavel, MD;  Location: [facility];  Service: Orthopedics;  Laterality: Right;   KNEE ARTHROSCOPY WITH MEDIAL MENISECTOMY Right 01/24/2017   Procedure: ARTHROSCOPY KNEE, PARTIAL MEDIAL  MENISCECTOMY,;  Surgeon: Norleen Gavel, MD;  Location: [facility];  Service: Orthopedics;  Laterality: Right;   PERINEAL HIDRADENITIS EXCISION      SOCIAL HISTORY: Social History   Socioeconomic History   Marital status: Single    Spouse name: Not on file   Number of children: Not on file   Years of education: 12   Highest education level: Not on file  Occupational History   Occupation: Geophysical data processor: [employer]   Tobacco Use   Smoking status: Former    Current packs/day:  0.25    Types: Cigarettes   Smokeless tobacco: Never   Tobacco comments:    quit 2 weeks ago and is taking chantix   Substance and Sexual Activity   Alcohol use: Yes    Comment: social   Drug use: No   Sexual activity: Not Currently    Birth control/protection: Post-menopausal  Other Topics Concern   Not on file  Social History Narrative   Regular exercise-yes   Caffiene Use-yes   Social Drivers of Health   Financial Resource Strain: Not on file  Food Insecurity: Not on file  Transportation Needs: Not on file  Physical Activity: Not on file  Stress: Not on file  Social Connections: Not on file  Intimate Partner Violence: Not on file    FAMILY HISTORY: Family History  Problem Relation Age of Onset   Hypertension Father    Kidney disease Father    Diabetes Father    Heart disease Mother    Hypertension Mother    Diabetes Mother    Diabetes Brother    Cancer Paternal Uncle    Stroke Neg Hx     Review of Systems  Constitutional:  Negative for appetite change, chills, fatigue, fever and unexpected weight change.  HENT:   Negative for hearing loss, lump/mass and trouble swallowing.  Eyes:  Negative for eye problems and icterus.  Respiratory:  Negative for chest tightness, cough and shortness of breath.   Cardiovascular:  Negative for chest pain, leg swelling and palpitations.  Gastrointestinal:  Negative for abdominal distention, abdominal pain, constipation, diarrhea, nausea and vomiting.  Endocrine: Negative for hot flashes.  Genitourinary:  Negative for difficulty urinating.   Musculoskeletal:  Negative for arthralgias.  Skin:  Negative for itching and rash.  Neurological:  Negative for dizziness, extremity weakness, headaches and numbness.  Hematological:  Negative for adenopathy. Does not bruise/bleed easily.  Psychiatric/Behavioral:  Negative for depression. The patient is not nervous/anxious.       PHYSICAL EXAMINATION    Vitals:   06/23/24 1008  BP:  (!) 162/76  Pulse: 63  Resp: 18  Temp: 98.5 F (36.9 C)  SpO2: 100%    Physical Exam Constitutional:      General: She is not in acute distress.    Appearance: Normal appearance. She is not toxic-appearing.  HENT:     Head: Normocephalic and atraumatic.     Mouth/Throat:     Mouth: Mucous membranes are moist.     Pharynx: Oropharynx is clear. No oropharyngeal exudate or posterior oropharyngeal erythema.  Eyes:     General: No scleral icterus. Cardiovascular:     Rate and Rhythm: Normal rate and regular rhythm.     Pulses: Normal pulses.     Heart sounds: Normal heart sounds.  Pulmonary:     Effort: Pulmonary effort is normal.     Breath sounds: Normal breath sounds.  Chest:     Comments: Left breast s/p lumpectomy and radiation, no sign of local recurrence, right breast benign Abdominal:     General: Abdomen is flat. Bowel sounds are normal. There is no distension.     Palpations: Abdomen is soft.     Tenderness: There is no abdominal tenderness.  Musculoskeletal:        General: No swelling.     Cervical back: Neck supple.  Lymphadenopathy:     Cervical: No cervical adenopathy.     Upper Body:     Right upper body: No supraclavicular or axillary adenopathy.     Left upper body: No supraclavicular or axillary adenopathy.  Skin:    General: Skin is warm and dry.     Findings: No rash.  Neurological:     General: No focal deficit present.     Mental Status: She is alert.  Psychiatric:        Mood and Affect: Mood normal.        Behavior: Behavior normal.         ASSESSMENT and THERAPY PLAN:   Malignant neoplasm of upper-inner quadrant of left breast in female, estrogen receptor positive (HCC) 07/04/2019:Routine screening detected an irregular, indeterminate architectural distortion in the left breast. US  showed a 1.0cm mass at the 9 o'clock position. Biopsy showed IDC, grade 2, HER-2 - (1+), ER+ 100%, PR+ 100%, Ki67 15%.  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  ER 100%, PR 100%, Ki-67 15%, HER-2 negative T1CN0 stage Ia   Oncotype DX recurrence score: 6   Treatment plan: Anastrozole  1 mg daily starting November 21, 2019 Anastrozole  toxicities: 1.  Hot flashes that wake her up at night:      Assessment and Plan Assessment & Plan Menopausal symptoms (hot flashes) Significant nocturnal hot flashes affecting sleep. Gabapentin   effective but causes morning drowsiness. Discussed Effexor and Veozah as alternatives. Effexor may alleviate symptoms but has potential side effects. Veozah is costly and often not covered by insurance without prior treatment attempts. - Provide handout on Effexor and Veozah. - Consider low-dose Effexor if chosen. - Advise weekend use initially to monitor side effects. - Discuss insurance coverage for Effexor and Veozah. - After discussion she opted to continue on Gabapentin  and requested a 300mg  tablet so she doesn't have to take three tablets individually at night.    History of left-sided breast cancer, stage 1A, ERPR positive, status post lumpectomy, adjuvant radiation, and ongoing antiestrogen therapy.  She has no clinical or radiographic concerns for recurrence. Completed lumpectomy and radiation. On anastrozole  since January 2021. Discussed 5 or 7-year therapy duration based on recurrence risk and side effects. Recurrence risk after 5 years is 3%. Discussed breast cancer index test for extended therapy evaluation. Experiencing anastrozole  side effects, including hot flashes. - Request mammogram records from [place]. - Discuss breast cancer index test if uncertain about continuing anastrozole  beyond 5 years. - Encourage continuation of anastrozole  if therapy is extended. - Schedule follow-up in one year. - Continue annual mammograms.     All questions were answered. The patient knows to call the clinic with any  problems, questions or concerns. We can certainly see the patient much sooner if necessary.  Total encounter time:30 minutes*in face-to-face visit time, chart review, lab review, care coordination, order entry, and documentation of the encounter time.    Morna Kendall, NP 06/23/24 1:33 PM Medical Oncology and Hematology [facility] [address] [place], [place] [address] Tel. [phone]    Fax. [phone]  *Total Encounter Time as defined by the Centers for Medicare and Medicaid Services includes, in addition to the face-to-face time of a patient visit (documented in the note above) non-face-to-face time: obtaining and reviewing outside history, ordering and reviewing medications, tests or procedures, care coordination (communications with other health care professionals or caregivers) and documentation in the medical record.

## 2024-06-23 NOTE — Patient Instructions (Signed)
 Fezolinetant Tablets What is this medication? FEZOLINETANT (FEZ oh LIN e tant) reduces the number and severity of hot flashes due to menopause. It works by blocking substances in your body that cause hot flashes and night sweats. This medicine may be used for other purposes; ask your health care provider or pharmacist if you have questions. COMMON BRAND NAME(S): VEOZAH What should I tell my care team before I take this medication? They need to know if you have any of these conditions: Kidney disease Liver disease An unusual or allergic reaction to fezolinetant, other medications, foods, dyes, or preservatives Pregnant or trying to get pregnant Breastfeeding How should I use this medication? Take this medication by mouth with water . Take it as directed on the prescription label at the same time every day. Do not cut, crush, or chew this medication. Swallow the tablets whole. You can take it with or without food. If it upsets your stomach, take it with food. Keep taking it unless your care team tells you to stop. Talk to your care team about the use of this medication in children. Special care may be needed. Overdosage: If you think you have taken too much of this medicine contact a poison control center or emergency room at once. NOTE: This medicine is only for you. Do not share this medicine with others. What if I miss a dose? If you miss a dose, take it as soon as you can unless it is more than 12 hours late. If it is more than 12 hours late, skip the missed dose. Take the next dose at the normal time. What may interact with this medication? Other medications may affect the way this medication works. Talk with your care team about all of the medications you take. They may suggest changes to your treatment plan to lower the risk of side effects and to make sure your medications work as intended. This list may not describe all possible interactions. Give your health care provider a list of all  the medicines, herbs, non-prescription drugs, or dietary supplements you use. Also tell them if you smoke, drink alcohol, or use illegal drugs. Some items may interact with your medicine. What should I watch for while using this medication? Visit your care team for regular checks on your progress. Tell your care team if your symptoms do not start to get better or if they get worse. You may need blood work while taking this medication. What side effects may I notice from receiving this medication? Side effects that you should report to your care team as soon as possible: Allergic reactions--skin rash, itching, hives, swelling of the face, lips, tongue, or throat Liver injury--right upper belly pain, loss of appetite, nausea, light-colored stool, dark yellow or brown urine, yellowing skin or eyes, unusual weakness or fatigue Side effects that usually do not require medical attention (report these to your care team if they continue or are bothersome): Back pain Diarrhea Hot flashes Stomach pain Trouble sleeping This list may not describe all possible side effects. Call your doctor for medical advice about side effects. You may report side effects to FDA at 1-800-FDA-1088. Where should I keep my medication? Keep out of the reach of children and pets. Store at room temperature between 20 and 25 degrees C (68 and 77 degrees F). Get rid of any unused medication after the expiration date. To get rid of medications that are no longer needed or have expired: Take the medication to a medication take-back program. Check with  your pharmacy or law enforcement to find a location. If you cannot return the medication, check the label or package insert to see if the medication should be thrown out in the garbage or flushed down the toilet. If you are not sure, ask your care team. If it is safe to put it in the trash, take the medication out of the container. Mix the medication with cat litter, dirt, coffee  grounds, or other unwanted substance. Seal the mixture in a bag or container. Put it in the trash. NOTE: This sheet is a summary. It may not cover all possible information. If you have questions about this medicine, talk to your doctor, pharmacist, or health care provider.  2024 Elsevier/Gold Standard (2022-04-13 00:00:00)Venlafaxine Extended-Release Capsules What is this medication? VENLAFAXINE (VEN la fax een) treats depression and anxiety. It increases the amount of serotonin and norepinephrine in the brain, hormones that help regulate mood. It belongs to a group of medications called SNRIs. This medicine may be used for other purposes; ask your health care provider or pharmacist if you have questions. COMMON BRAND NAME(S): Effexor XR What should I tell my care team before I take this medication? They need to know if you have any of these conditions: Bleeding disorders Glaucoma Heart disease High blood pressure High cholesterol Kidney disease Liver disease Low levels of sodium in the blood Mania or bipolar disorder Seizures Suicidal thoughts, plans, or attempt by you or a family member Take medications that treat or prevent blood clots Thyroid disease An unusual or allergic reaction to venlafaxine, other medications, foods, dyes, or preservatives Pregnant or trying to get pregnant Breastfeeding How should I use this medication? Take this medication by mouth with a full glass of water . Take it as directed on the prescription label. Do not cut, crush, or chew this medication. Take it with food. You may open the capsule and put the contents in 1 teaspoon of applesauce. Swallow the medication and applesauce right away. Do not chew the medication or applesauce. Follow with a glass of water  to ensure complete swallowing of the pellets. Try to take your medication at about the same time each day. Do not take your medication more often than directed. Keep taking this medication unless your care  team tells you to stop. Stopping it too quickly can cause serious side effects. It can also make your condition worse. A special MedGuide will be given to you by the pharmacist with each prescription and refill. Be sure to read this information carefully each time. Talk to your care team about the use of this medication in children. Special care may be needed. Overdosage: If you think you have taken too much of this medicine contact a poison control center or emergency room at once. NOTE: This medicine is only for you. Do not share this medicine with others. What if I miss a dose? If you miss a dose, take it as soon as you can. If it is almost time for your next dose, take only that dose. Do not take double or extra doses. What may interact with this medication? Do not take this medication with any of the following: Alcohol Certain medications for fungal infections, such as fluconazole , itraconazole, ketoconazole, posaconazole, voriconazole Cisapride Desvenlafaxine Dronedarone Duloxetine Levomilnacipran Linezolid MAOIs, such as Carbex, Eldepryl, Marplan, Nardil, and Parnate Methylene blue  (injected into a vein) Milnacipran Pimozide Thioridazine This medication may also interact with the following: Amphetamines Aspirin and aspirin-like medications Certain medications for mental health conditions Certain medications for  migraine headaches, such as almotriptan, eletriptan, frovatriptan, naratriptan, rizatriptan, sumatriptan, zolmitriptan Certain medications for sleep Certain medications that treat or prevent blood clots, such as dalteparin, enoxaparin, warfarin Cimetidine Clozapine Diuretics Fentanyl  Furazolidone Indinavir Isoniazid Lithium Metoprolol NSAIDS, medications for pain and inflammation, such as ibuprofen or naproxen Other medications that cause heart rhythm changes Procarbazine Rasagiline Supplements, such as St. John's wort, kava kava,  valerian Tramadol  Tryptophan This list may not describe all possible interactions. Give your health care provider a list of all the medicines, herbs, non-prescription drugs, or dietary supplements you use. Also tell them if you smoke, drink alcohol, or use illegal drugs. Some items may interact with your medicine. What should I watch for while using this medication? Tell your care team if your symptoms do not get better or if they get worse. Visit your care team for regular checks on your progress. Because it may take several weeks to see the full effects of this medication, it is important to continue your treatment as prescribed by your care team. Watch for new or worsening thoughts of suicide or depression. This includes sudden changes in mood, behaviors, or thoughts. These changes can happen at any time but are more common in the beginning of treatment or after a change in dose. Call your care team right away if you experience these thoughts or worsening depression. This medication may cause mood and behavior changes, such as anxiety, nervousness, irritability, hostility, restlessness, excitability, hyperactivity, or trouble sleeping. These changes can happen at any time but are more common in the beginning of treatment or after a change in dose. Call your care team right away if you notice any of these symptoms. This medication can cause an increase in blood pressure. Check with your care team for instructions on monitoring your blood pressure while taking this medication. This medication may affect your coordination, reaction time, or judgment. Do not drive or operate machinery until you know how this medication affects you. Sit up or stand slowly to reduce the risk of dizzy or fainting spells. Drinking alcohol with this medication can increase the risk of these side effects. Your mouth may get dry. Chewing sugarless gum or sucking hard candy and drinking plenty of water  may help. Contact your care  team if the problem does not go away or is severe. What side effects may I notice from receiving this medication? Side effects that you should report to your care team as soon as possible: Allergic reactions--skin rash, itching, hives, swelling of the face, lips, tongue, or throat Bleeding--bloody or black, tar-like stools, red or dark brown urine, vomiting blood or brown material that looks like coffee grounds, small, red or purple spots on skin, unusual bleeding or bruising Heart rhythm changes--fast or irregular heartbeat, dizziness, feeling faint or lightheaded, chest pain, trouble breathing Increase in blood pressure Loss of appetite with weight loss Low sodium level--muscle weakness, fatigue, dizziness, headache, confusion Serotonin syndrome--irritability, confusion, fast or irregular heartbeat, muscle stiffness, twitching muscles, sweating, high fever, seizures, chills, vomiting, diarrhea Sudden eye pain or change in vision such as blurry vision, seeing halos around lights, vision loss Thoughts of suicide or self-harm, worsening mood, feelings of depression Side effects that usually do not require medical attention (report to your care team if they continue or are bothersome): Anxiety, nervousness Change in sex drive or performance Dizziness Dry mouth Excessive sweating Nausea Tremors or shaking Trouble sleeping This list may not describe all possible side effects. Call your doctor for medical advice about side effects.  You may report side effects to FDA at 1-800-FDA-1088. Where should I keep my medication? Keep out of the reach of children and pets. Store at a controlled temperature between 20 and 25 degrees C (68 degrees and 77 degrees F), in a dry place. Throw away any unused medication after the expiration date. NOTE: This sheet is a summary. It may not cover all possible information. If you have questions about this medicine, talk to your doctor, pharmacist, or health care  provider.  2024 Elsevier/Gold Standard (2022-11-02 00:00:00)

## 2024-07-02 ENCOUNTER — Encounter: Payer: Self-pay | Admitting: Hematology and Oncology

## 2024-07-03 ENCOUNTER — Encounter: Payer: Self-pay | Admitting: Hematology and Oncology

## 2024-07-16 ENCOUNTER — Ambulatory Visit: Admitting: Hematology and Oncology

## 2024-09-13 ENCOUNTER — Other Ambulatory Visit: Payer: Self-pay | Admitting: Hematology and Oncology

## 2024-12-15 ENCOUNTER — Other Ambulatory Visit: Payer: Self-pay | Admitting: Hematology and Oncology

## 2025-06-23 ENCOUNTER — Ambulatory Visit: Admitting: Adult Health
